# Patient Record
Sex: Female | Born: 1945 | Race: White | Hispanic: No | Marital: Married | Smoking: Never smoker
Health system: Southern US, Community
[De-identification: ages and names within clinical notes are randomized; demographics above are authoritative.]

## PROBLEM LIST (undated history)

## (undated) DIAGNOSIS — M1990 Unspecified osteoarthritis, unspecified site: Secondary | ICD-10-CM

## (undated) DIAGNOSIS — K5730 Diverticulosis of large intestine without perforation or abscess without bleeding: Secondary | ICD-10-CM

## (undated) DIAGNOSIS — I10 Essential (primary) hypertension: Secondary | ICD-10-CM

## (undated) DIAGNOSIS — C44519 Basal cell carcinoma of skin of other part of trunk: Secondary | ICD-10-CM

## (undated) DIAGNOSIS — M8580 Other specified disorders of bone density and structure, unspecified site: Secondary | ICD-10-CM

## (undated) DIAGNOSIS — K219 Gastro-esophageal reflux disease without esophagitis: Secondary | ICD-10-CM

## (undated) HISTORY — DX: Gastro-esophageal reflux disease without esophagitis: K21.9

## (undated) HISTORY — PX: KNEE ARTHROSCOPY: SHX127

## (undated) HISTORY — DX: Unspecified osteoarthritis, unspecified site: M19.90

## (undated) HISTORY — DX: Other specified disorders of bone density and structure, unspecified site: M85.80

## (undated) HISTORY — PX: TRIGGER FINGER RELEASE: SHX641

## (undated) HISTORY — DX: Basal cell carcinoma of skin of other part of trunk: C44.519

## (undated) HISTORY — DX: Essential (primary) hypertension: I10

## (undated) HISTORY — PX: TONSILLECTOMY: SUR1361

## (undated) HISTORY — DX: Diverticulosis of large intestine without perforation or abscess without bleeding: K57.30

## (undated) HISTORY — PX: BACK SURGERY: SHX140

---

## 1998-10-28 ENCOUNTER — Ambulatory Visit (HOSPITAL_COMMUNITY): Admission: RE | Admit: 1998-10-28 | Discharge: 1998-10-28 | Payer: Self-pay

## 1999-05-25 ENCOUNTER — Other Ambulatory Visit: Admission: RE | Admit: 1999-05-25 | Discharge: 1999-05-25 | Payer: Self-pay | Admitting: Obstetrics and Gynecology

## 2000-07-12 ENCOUNTER — Other Ambulatory Visit: Admission: RE | Admit: 2000-07-12 | Discharge: 2000-07-12 | Payer: Self-pay | Admitting: Obstetrics and Gynecology

## 2001-07-12 ENCOUNTER — Other Ambulatory Visit: Admission: RE | Admit: 2001-07-12 | Discharge: 2001-07-12 | Payer: Self-pay | Admitting: Obstetrics and Gynecology

## 2002-07-14 ENCOUNTER — Other Ambulatory Visit: Admission: RE | Admit: 2002-07-14 | Discharge: 2002-07-14 | Payer: Self-pay | Admitting: Obstetrics and Gynecology

## 2003-07-15 ENCOUNTER — Other Ambulatory Visit: Admission: RE | Admit: 2003-07-15 | Discharge: 2003-07-15 | Payer: Self-pay | Admitting: Obstetrics and Gynecology

## 2004-07-26 ENCOUNTER — Other Ambulatory Visit: Admission: RE | Admit: 2004-07-26 | Discharge: 2004-07-26 | Payer: Self-pay | Admitting: Addiction Medicine

## 2005-07-27 ENCOUNTER — Other Ambulatory Visit: Admission: RE | Admit: 2005-07-27 | Discharge: 2005-07-27 | Payer: Self-pay | Admitting: Obstetrics and Gynecology

## 2005-07-27 ENCOUNTER — Encounter: Payer: Self-pay | Admitting: Internal Medicine

## 2006-08-06 ENCOUNTER — Encounter: Payer: Self-pay | Admitting: Internal Medicine

## 2006-08-06 ENCOUNTER — Other Ambulatory Visit: Admission: RE | Admit: 2006-08-06 | Discharge: 2006-08-06 | Payer: Self-pay | Admitting: Obstetrics and Gynecology

## 2007-08-07 ENCOUNTER — Other Ambulatory Visit: Admission: RE | Admit: 2007-08-07 | Discharge: 2007-08-07 | Payer: Self-pay | Admitting: Obstetrics and Gynecology

## 2007-08-07 ENCOUNTER — Encounter: Payer: Self-pay | Admitting: Internal Medicine

## 2008-08-13 ENCOUNTER — Encounter: Payer: Self-pay | Admitting: Obstetrics and Gynecology

## 2008-08-13 ENCOUNTER — Ambulatory Visit: Payer: Self-pay | Admitting: Obstetrics and Gynecology

## 2008-08-13 ENCOUNTER — Other Ambulatory Visit: Admission: RE | Admit: 2008-08-13 | Discharge: 2008-08-13 | Payer: Self-pay | Admitting: Obstetrics and Gynecology

## 2008-09-17 ENCOUNTER — Ambulatory Visit: Payer: Self-pay | Admitting: Internal Medicine

## 2008-09-17 DIAGNOSIS — N951 Menopausal and female climacteric states: Secondary | ICD-10-CM | POA: Insufficient documentation

## 2008-09-17 DIAGNOSIS — R51 Headache: Secondary | ICD-10-CM | POA: Insufficient documentation

## 2008-09-17 DIAGNOSIS — R519 Headache, unspecified: Secondary | ICD-10-CM | POA: Insufficient documentation

## 2008-09-17 DIAGNOSIS — M1990 Unspecified osteoarthritis, unspecified site: Secondary | ICD-10-CM

## 2008-09-17 HISTORY — DX: Unspecified osteoarthritis, unspecified site: M19.90

## 2008-09-17 LAB — CONVERTED CEMR LAB
ALT: 29 units/L (ref 0–35)
AST: 30 units/L (ref 0–37)
Albumin: 4.3 g/dL (ref 3.5–5.2)
Alkaline Phosphatase: 79 units/L (ref 39–117)
BUN: 21 mg/dL (ref 6–23)
Basophils Absolute: 0.1 10*3/uL (ref 0.0–0.1)
Basophils Relative: 0.7 % (ref 0.0–3.0)
Bilirubin, Direct: 0.1 mg/dL (ref 0.0–0.3)
CO2: 34 meq/L — ABNORMAL HIGH (ref 19–32)
Calcium: 10.3 mg/dL (ref 8.4–10.5)
Chloride: 105 meq/L (ref 96–112)
Cholesterol: 219 mg/dL — ABNORMAL HIGH (ref 0–200)
Creatinine, Ser: 0.8 mg/dL (ref 0.4–1.2)
Eosinophils Absolute: 0.1 10*3/uL (ref 0.0–0.7)
Eosinophils Relative: 1 % (ref 0.0–5.0)
GFR calc non Af Amer: 77.4 mL/min (ref >60)
Glucose, Bld: 87 mg/dL (ref 70–99)
HCT: 41.8 % (ref 36.0–46.0)
Hemoglobin: 14.3 g/dL (ref 12.0–15.0)
Lymphocytes Relative: 30.6 % (ref 12.0–46.0)
Lymphs Abs: 2.6 10*3/uL (ref 0.7–4.0)
MCHC: 34.3 g/dL (ref 30.0–36.0)
MCV: 91.5 fL (ref 78.0–100.0)
Monocytes Absolute: 0.7 10*3/uL (ref 0.1–1.0)
Monocytes Relative: 8.4 % (ref 3.0–12.0)
Neutro Abs: 5 10*3/uL (ref 1.4–7.7)
Neutrophils Relative %: 59.3 % (ref 43.0–77.0)
Platelets: 268 10*3/uL (ref 150.0–400.0)
Potassium: 4.6 meq/L (ref 3.5–5.1)
RBC: 4.56 M/uL (ref 3.87–5.11)
RDW: 12 % (ref 11.5–14.6)
Sodium: 146 meq/L — ABNORMAL HIGH (ref 135–145)
TSH: 1.54 microintl units/mL (ref 0.35–5.50)
Total Bilirubin: 1.1 mg/dL (ref 0.3–1.2)
Total Protein: 7.5 g/dL (ref 6.0–8.3)
WBC: 8.5 10*3/uL (ref 4.5–10.5)

## 2008-10-29 ENCOUNTER — Telehealth: Payer: Self-pay | Admitting: Internal Medicine

## 2009-08-18 ENCOUNTER — Other Ambulatory Visit: Admission: RE | Admit: 2009-08-18 | Discharge: 2009-08-18 | Payer: Self-pay | Admitting: Obstetrics and Gynecology

## 2009-08-18 ENCOUNTER — Encounter: Payer: Self-pay | Admitting: Internal Medicine

## 2009-08-18 ENCOUNTER — Ambulatory Visit: Payer: Self-pay | Admitting: Obstetrics and Gynecology

## 2009-09-20 ENCOUNTER — Ambulatory Visit: Payer: Self-pay | Admitting: Internal Medicine

## 2009-09-20 LAB — CONVERTED CEMR LAB
ALT: 29 units/L (ref 0–35)
AST: 28 units/L (ref 0–37)
Albumin: 4.2 g/dL (ref 3.5–5.2)
Alkaline Phosphatase: 75 units/L (ref 39–117)
BUN: 21 mg/dL (ref 6–23)
Basophils Absolute: 0.1 10*3/uL (ref 0.0–0.1)
Basophils Relative: 0.7 % (ref 0.0–3.0)
Bilirubin Urine: NEGATIVE
Bilirubin, Direct: 0.1 mg/dL (ref 0.0–0.3)
Blood in Urine, dipstick: NEGATIVE
CO2: 29 meq/L (ref 19–32)
Calcium: 9.8 mg/dL (ref 8.4–10.5)
Chloride: 107 meq/L (ref 96–112)
Cholesterol: 210 mg/dL — ABNORMAL HIGH (ref 0–200)
Creatinine, Ser: 0.9 mg/dL (ref 0.4–1.2)
Direct LDL: 119.7 mg/dL
Eosinophils Absolute: 0.2 10*3/uL (ref 0.0–0.7)
Eosinophils Relative: 2.2 % (ref 0.0–5.0)
GFR calc non Af Amer: 70.03 mL/min (ref >60)
Glucose, Bld: 97 mg/dL (ref 70–99)
Glucose, Urine, Semiquant: NEGATIVE
HCT: 40 % (ref 36.0–46.0)
HDL: 83.6 mg/dL (ref >39.00)
Hemoglobin: 13.7 g/dL (ref 12.0–15.0)
Ketones, urine, test strip: NEGATIVE
Lymphocytes Relative: 29.6 % (ref 12.0–46.0)
Lymphs Abs: 2.8 10*3/uL (ref 0.7–4.0)
MCHC: 34.2 g/dL (ref 30.0–36.0)
MCV: 92.1 fL (ref 78.0–100.0)
Monocytes Absolute: 0.8 10*3/uL (ref 0.1–1.0)
Monocytes Relative: 8.5 % (ref 3.0–12.0)
Neutro Abs: 5.6 10*3/uL (ref 1.4–7.7)
Neutrophils Relative %: 59 % (ref 43.0–77.0)
Nitrite: NEGATIVE
Platelets: 285 10*3/uL (ref 150.0–400.0)
Potassium: 6 meq/L — ABNORMAL HIGH (ref 3.5–5.1)
Protein, U semiquant: NEGATIVE
RBC: 4.34 M/uL (ref 3.87–5.11)
RDW: 13 % (ref 11.5–14.6)
Sodium: 144 meq/L (ref 135–145)
Specific Gravity, Urine: 1.01
TSH: 4.03 microintl units/mL (ref 0.35–5.50)
Total Bilirubin: 0.6 mg/dL (ref 0.3–1.2)
Total CHOL/HDL Ratio: 3
Total Protein: 6.5 g/dL (ref 6.0–8.3)
Triglycerides: 55 mg/dL (ref 0.0–149.0)
Urobilinogen, UA: 0.2
VLDL: 11 mg/dL (ref 0.0–40.0)
WBC Urine, dipstick: NEGATIVE
WBC: 9.5 10*3/uL (ref 4.5–10.5)
pH: 7

## 2009-09-27 ENCOUNTER — Ambulatory Visit: Payer: Self-pay | Admitting: Internal Medicine

## 2009-10-05 ENCOUNTER — Ambulatory Visit: Payer: Self-pay | Admitting: Obstetrics and Gynecology

## 2010-03-10 NOTE — Assessment & Plan Note (Signed)
Summary: CPX- NO PELVIC//CCM   Vital Signs:  Patient profile:   65 year old female Menstrual status:  postmenopausal Height:      57.5 inches Weight:      153 pounds BMI:     32.65 Temp:     98.1 degrees F oral BP sitting:   142 / 92  (left arm) Cuff size:   regular  Vitals Entered By: Kathrynn Speed CMA (September 27, 2009 1:18 PM) CC: cpx, no pelvic, src Is Patient Diabetic? No     Menstrual Status postmenopausal   CC:  cpx, no pelvic, and src.  History of Present Illness: 65 year old patient who is seen today for a health maintenance examination.  She does quite well.  Will rule complaint of significant arthritis involving both knees.  She is on chronic anti-inflammatory medications and is followed closely by orthopedics.  She receives annual gynecologic care and has had a recent mammogram  Current Medications (verified): 1)  Mobic 15 Mg Tabs (Meloxicam) .... Once Daily 2)  Multi Viti 3)  Multivitamins  Tabs (Multiple Vitamin) .... Once Day 4)  Vitamin C 100 Mg Tabs (Ascorbic Acid) .... Once Day 5)  Formula E 400 400 Unit Caps (Vitamin E) .... Once Day 6)  Vitamin D3 1000 Unit Tabs (Cholecalciferol) .... Once Day 7)  B Complex  Tabs (B Complex Vitamins) .... Once Day 8)  Cinnamon 1000 Mg Caps (Cinnamon) .... Once Day 9)  Calcium 1200-1000 Mg-Unit Chew (Calcium Carbonate-Vit D-Min) .... Once Day 10)  Red Yeast Rice 1800 Mg Caps (Red Yeast Rice Extract) .... Once Day 11)  Coq-10 120 Mg Cr-Caps (Coenzyme Q10) .... Once Day 12)  Triple Flex Bone & Joint 750-600-100 Mg Tabs (Glucosamine-Chondroit-Calcium) .... Once A Day  Allergies (verified): No Known Drug Allergies  Past History:  Past Medical History: Reviewed history from 09/17/2008 and no changes required. Headache Osteoarthritis  Past Surgical History: arthroscopic left knee surgery in October, 1996 back surgery in June, 1999 ([facility]) right knee arthroscopic surgery in 2006 ([facility]) left thumb trigger finger  surgery October 2007  Colonoscopy.  2009  Shingles-[facility]  Family History: Reviewed history from 09/17/2008 and no changes required. father died young suicide death mother died in her 67s pancreatic cancer  one brother is well  Social History: Reviewed history from 09/17/2008 and no changes required. Married Never Smoked Regular exercise-no two daughters, 4 grandchildren  Review of Systems  The patient denies anorexia, fever, weight loss, weight gain, vision loss, decreased hearing, hoarseness, chest pain, syncope, dyspnea on exertion, peripheral edema, prolonged cough, headaches, hemoptysis, abdominal pain, melena, hematochezia, severe indigestion/heartburn, hematuria, incontinence, genital sores, muscle weakness, suspicious skin lesions, transient blindness, difficulty walking, depression, unusual weight change, abnormal bleeding, enlarged lymph nodes, angioedema, and breast masses.    Physical Exam  General:  overweight-appearing.  140/82overweight-appearing.   Head:  Normocephalic and atraumatic without obvious abnormalities. No apparent alopecia or balding. Eyes:  No corneal or conjunctival inflammation noted. EOMI. Perrla. Funduscopic exam benign, without hemorrhages, exudates or papilledema. Vision grossly normal. Ears:  External ear exam shows no significant lesions or deformities.  Otoscopic examination reveals clear canals, tympanic membranes are intact bilaterally without bulging, retraction, inflammation or discharge. Hearing is grossly normal bilaterally. Nose:  External nasal examination shows no deformity or inflammation. Nasal mucosa are pink and moist without lesions or exudates. Mouth:  Oral mucosa and oropharynx without lesions or exudates.  Teeth in good repair. Neck:  No deformities, masses, or tenderness  noted. Chest Wall:  No deformities, masses, or tenderness noted. Lungs:  Normal respiratory effort, chest expands  symmetrically. Lungs are clear to auscultation, no crackles or wheezes. Heart:  Normal rate and regular rhythm. S1 and S2 normal without gallop, murmur, click, rub or other extra sounds. Abdomen:  Bowel sounds positive,abdomen soft and non-tender without masses, organomegaly or hernias noted. Msk:  No deformity or scoliosis noted of thoracic or lumbar spine.   Pulses:  R and L carotid,radial,femoral,dorsalis pedis and posterior tibial pulses are full and equal bilaterally Extremities:  No clubbing, cyanosis, edema, or deformity noted with normal full range of motion of all joints.   Neurologic:  No cranial nerve deficits noted. Station and gait are normal. Plantar reflexes are down-going bilaterally. DTRs are symmetrical throughout. Sensory, motor and coordinative functions appear intact. Skin:  Intact without suspicious lesions or rashes Cervical Nodes:  No lymphadenopathy noted Axillary Nodes:  No palpable lymphadenopathy Inguinal Nodes:  No significant adenopathy Psych:  Cognition and judgment appear intact. Alert and cooperative with normal attention span and concentration. No apparent delusions, illusions, hallucinations   Impression & Recommendations:  Problem # 1:  Preventive Health Care (ICD-V70.0)  Complete Medication List: 1)  Mobic 15 Mg Tabs (Meloxicam) .... Once daily 2)  Multi Viti  3)  Multivitamins Tabs (Multiple vitamin) .... Once day 4)  Vitamin C 100 Mg Tabs (Ascorbic acid) .... Once day 5)  Formula E 400 400 Unit Caps (Vitamin e) .... Once day 6)  Vitamin D3 1000 Unit Tabs (Cholecalciferol) .... Once day 7)  B Complex Tabs (B complex vitamins) .... Once day 8)  Cinnamon 1000 Mg Caps (cinnamon)  .... Once day 9)  Calcium 1200-1000 Mg-unit Chew (Calcium carbonate-vit d-min) .... Once day 10)  Red Yeast Rice 1800 Mg Caps (red Yeast Rice Extract)  .... Once day 11)  Coq-10 120 Mg Cr-caps (coenzyme Q10)  .... Once day 12)  Triple Flex Bone & Joint 750-600-100 Mg Tabs  (Glucosamine-chondroit-calcium) .... Once a day  Patient Instructions: 1)  It is important that you exercise regularly at least 20 minutes 5 times a week. If you develop chest pain, have severe difficulty breathing, or feel very tired , stop exercising immediately and seek medical attention. 2)  You need to lose weight. Consider a lower calorie diet and regular exercise.  3)  Take calcium +Vitamin D daily. 4)  Check your Blood Pressure regularly. If it is above: 150/90 you should make an appointment. Prescriptions: MOBIC 15 MG TABS (MELOXICAM) once daily  #90 x 4   Entered and Authorized by:   Gordy Savers  MD   Signed by:   Gordy Savers  MD on 09/27/2009   Method used:   Print then Give to Patient   RxID:   [id number]

## 2010-03-10 NOTE — Letter (Signed)
Summary: [facility]   Imported By: Maryln Gottron 08/24/2009 13:25:19  _____________________________________________________________________  External Attachment:    Type:   Image     Comment:   External Document

## 2010-03-10 NOTE — Progress Notes (Signed)
Summary: Personal History provided by patient  Personal History provided by patient   Imported By: Maryln Gottron 09/30/2009 11:01:05  _____________________________________________________________________  External Attachment:    Type:   Image     Comment:   External Document

## 2010-06-29 LAB — HM MAMMOGRAPHY: HM Mammogram: NEGATIVE

## 2010-06-30 ENCOUNTER — Encounter: Payer: Self-pay | Admitting: Internal Medicine

## 2010-07-11 ENCOUNTER — Encounter: Payer: Self-pay | Admitting: Internal Medicine

## 2010-08-22 ENCOUNTER — Other Ambulatory Visit: Payer: Self-pay | Admitting: Obstetrics and Gynecology

## 2010-08-22 ENCOUNTER — Encounter (INDEPENDENT_AMBULATORY_CARE_PROVIDER_SITE_OTHER): Payer: 59 | Admitting: Obstetrics and Gynecology

## 2010-08-22 ENCOUNTER — Other Ambulatory Visit (HOSPITAL_COMMUNITY)
Admission: RE | Admit: 2010-08-22 | Discharge: 2010-08-22 | Disposition: A | Discharge status: Home / Self Care | Payer: 59 | Source: Ambulatory Visit | Attending: Obstetrics and Gynecology | Admitting: Obstetrics and Gynecology

## 2010-08-22 DIAGNOSIS — Z01419 Encounter for gynecological examination (general) (routine) without abnormal findings: Secondary | ICD-10-CM

## 2010-08-22 DIAGNOSIS — Z124 Encounter for screening for malignant neoplasm of cervix: Secondary | ICD-10-CM | POA: Insufficient documentation

## 2010-09-12 LAB — HM PAP SMEAR: HM Pap smear: NORMAL

## 2010-09-22 ENCOUNTER — Other Ambulatory Visit (INDEPENDENT_AMBULATORY_CARE_PROVIDER_SITE_OTHER): Payer: 59

## 2010-09-22 DIAGNOSIS — Z0000 Encounter for general adult medical examination without abnormal findings: Secondary | ICD-10-CM

## 2010-09-22 LAB — CBC WITH DIFFERENTIAL/PLATELET
Basophils Absolute: 0.1 10*3/uL (ref 0.0–0.1)
Eosinophils Absolute: 0.2 10*3/uL (ref 0.0–0.7)
HCT: 41.1 % (ref 36.0–46.0)
Lymphs Abs: 2.5 10*3/uL (ref 0.7–4.0)
MCHC: 33.5 g/dL (ref 30.0–36.0)
MCV: 92.9 fl (ref 78.0–100.0)
Monocytes Absolute: 0.6 10*3/uL (ref 0.1–1.0)
Neutrophils Relative %: 55.2 % (ref 43.0–77.0)
Platelets: 259 10*3/uL (ref 150.0–400.0)
RDW: 12.9 % (ref 11.5–14.6)
WBC: 7.4 10*3/uL (ref 4.5–10.5)

## 2010-09-22 LAB — HEPATIC FUNCTION PANEL
ALT: 35 U/L (ref 0–35)
Bilirubin, Direct: 0 mg/dL (ref 0.0–0.3)
Total Bilirubin: 0.7 mg/dL (ref 0.3–1.2)

## 2010-09-22 LAB — LIPID PANEL
Cholesterol: 246 mg/dL — ABNORMAL HIGH (ref 0–200)
HDL: 82.2 mg/dL (ref >39.00)
Total CHOL/HDL Ratio: 3
Triglycerides: 79 mg/dL (ref 0.0–149.0)
VLDL: 15.8 mg/dL (ref 0.0–40.0)

## 2010-09-22 LAB — POCT URINALYSIS DIPSTICK
Bilirubin, UA: NEGATIVE
Nitrite, UA: NEGATIVE
Protein, UA: NEGATIVE
pH, UA: 7

## 2010-09-22 LAB — BASIC METABOLIC PANEL
BUN: 22 mg/dL (ref 6–23)
Chloride: 102 mEq/L (ref 96–112)
Creatinine, Ser: 0.8 mg/dL (ref 0.4–1.2)
Glucose, Bld: 91 mg/dL (ref 70–99)
Potassium: 5.2 mEq/L — ABNORMAL HIGH (ref 3.5–5.1)

## 2010-09-28 ENCOUNTER — Encounter: Payer: Self-pay | Admitting: Internal Medicine

## 2010-09-30 ENCOUNTER — Ambulatory Visit (INDEPENDENT_AMBULATORY_CARE_PROVIDER_SITE_OTHER): Payer: 59 | Admitting: Internal Medicine

## 2010-09-30 ENCOUNTER — Encounter: Payer: Self-pay | Admitting: Internal Medicine

## 2010-09-30 DIAGNOSIS — Z0000 Encounter for general adult medical examination without abnormal findings: Secondary | ICD-10-CM

## 2010-09-30 DIAGNOSIS — Z23 Encounter for immunization: Secondary | ICD-10-CM

## 2010-09-30 MED ORDER — MELOXICAM 15 MG PO TABS: 15.0000 mg | ORAL_TABLET | Freq: Every day | ORAL | Status: DC

## 2010-09-30 NOTE — Progress Notes (Signed)
  Subjective:    Patient ID: Robin Francis, female    DOB: November 16, 1945, 65 y.o.   MRN: [id number]  HPI  65 year old patient who is seen today for a health maintenance examination. She enjoys excellent health. She is followed by orthopedics due to osteoarthritis. She is followed by gynecology and has had a colonoscopy in 2008. No new concerns or complaints today laboratory studies were reviewed and these were unremarkable Past medical history reviewed from the chart unremarkable except for orthopedic procedures Medications include meloxicam and a number of herbal products and vitamins Family history reviewed father died of suicide death mother died young from pancreatic cancer one brother remains well    Review of Systems  Constitutional: Negative for fever, appetite change, fatigue and unexpected weight change.  HENT: Negative for hearing loss, ear pain, nosebleeds, congestion, sore throat, mouth sores, trouble swallowing, neck stiffness, dental problem, voice change, sinus pressure and tinnitus.   Eyes: Negative for photophobia, pain, redness and visual disturbance.  Respiratory: Negative for cough, chest tightness and shortness of breath.   Cardiovascular: Negative for chest pain, palpitations and leg swelling.  Gastrointestinal: Negative for nausea, vomiting, abdominal pain, diarrhea, constipation, blood in stool, abdominal distention and rectal pain.  Genitourinary: Negative for dysuria, urgency, frequency, hematuria, flank pain, vaginal bleeding, vaginal discharge, difficulty urinating, genital sores, vaginal pain, menstrual problem and pelvic pain.  Musculoskeletal: Negative for back pain and arthralgias.  Skin: Negative for rash.  Neurological: Negative for dizziness, syncope, speech difficulty, weakness, light-headedness, numbness and headaches.  Hematological: Negative for adenopathy. Does not bruise/bleed easily.  Psychiatric/Behavioral: Negative for suicidal ideas, behavioral  problems, self-injury, dysphoric mood and agitation. The patient is not nervous/anxious.        Objective:   Physical Exam  Constitutional: She is oriented to person, place, and time. She appears well-developed and well-nourished.  HENT:  Head: Normocephalic and atraumatic.  Right Ear: External ear normal.  Left Ear: External ear normal.  Mouth/Throat: Oropharynx is clear and moist.  Eyes: Conjunctivae and EOM are normal.  Neck: Normal range of motion. Neck supple. No JVD present. No thyromegaly present.  Cardiovascular: Normal rate, regular rhythm, normal heart sounds and intact distal pulses.   No murmur heard. Pulmonary/Chest: Effort normal and breath sounds normal. She has no wheezes. She has no rales.  Abdominal: Soft. Bowel sounds are normal. She exhibits no distension and no mass. There is no tenderness. There is no rebound and no guarding.  Musculoskeletal: Normal range of motion. She exhibits no edema and no tenderness.  Neurological: She is alert and oriented to person, place, and time. She has normal reflexes. No cranial nerve deficit. She exhibits normal muscle tone. Coordination normal.  Skin: Skin is warm and dry. No rash noted.  Psychiatric: She has a normal mood and affect. Her behavior is normal.          Assessment & Plan:   Preventive health examination  We'll continue calcium and vitamin D supplements. We'll continue regular exercise. Efforts at  weight loss encouraged return in one year for followup.

## 2010-09-30 NOTE — Patient Instructions (Signed)
Take a calcium supplement, plus [phone] units of vitamin D    It is important that you exercise regularly, at least 20 minutes 3 to 4 times per week.  If you develop chest pain or shortness of breath seek  medical attention.  You need to lose weight.  Consider a lower calorie diet and regular exercise.  Return in one year for follow-up

## 2010-10-27 ENCOUNTER — Other Ambulatory Visit: Payer: Self-pay | Admitting: Internal Medicine

## 2011-01-05 NOTE — Progress Notes (Signed)
  Subjective:    Patient ID: Robin Francis, female    DOB: 01-Jun-1945, 66 y.o.   MRN: [id number]  HPI    Review of Systems     Objective:   Physical Exam        Assessment & Plan:

## 2011-01-06 ENCOUNTER — Other Ambulatory Visit: Payer: Self-pay | Admitting: Orthopedic Surgery

## 2011-03-20 ENCOUNTER — Encounter (HOSPITAL_COMMUNITY): Payer: Self-pay | Admitting: Pharmacy Technician

## 2011-03-21 ENCOUNTER — Encounter (HOSPITAL_COMMUNITY): Payer: Self-pay

## 2011-03-21 ENCOUNTER — Ambulatory Visit (HOSPITAL_COMMUNITY)
Admission: RE | Admit: 2011-03-21 | Discharge: 2011-03-21 | Disposition: A | Discharge status: Home / Self Care | Payer: 59 | Source: Ambulatory Visit | Attending: Orthopedic Surgery | Admitting: Orthopedic Surgery

## 2011-03-21 ENCOUNTER — Encounter (HOSPITAL_COMMUNITY)
Admission: RE | Admit: 2011-03-21 | Discharge: 2011-03-21 | Disposition: A | Discharge status: Home / Self Care | Payer: 59 | Source: Ambulatory Visit | Attending: Orthopedic Surgery | Admitting: Orthopedic Surgery

## 2011-03-21 DIAGNOSIS — IMO0002 Reserved for concepts with insufficient information to code with codable children: Secondary | ICD-10-CM | POA: Insufficient documentation

## 2011-03-21 DIAGNOSIS — Z01812 Encounter for preprocedural laboratory examination: Secondary | ICD-10-CM | POA: Insufficient documentation

## 2011-03-21 DIAGNOSIS — M1710 Unilateral primary osteoarthritis, unspecified knee: Secondary | ICD-10-CM | POA: Insufficient documentation

## 2011-03-21 LAB — URINALYSIS, ROUTINE W REFLEX MICROSCOPIC
Glucose, UA: NEGATIVE mg/dL
Hgb urine dipstick: NEGATIVE
Leukocytes, UA: NEGATIVE
Specific Gravity, Urine: 1.016 (ref 1.005–1.030)
Urobilinogen, UA: 0.2 mg/dL (ref 0.0–1.0)

## 2011-03-21 LAB — COMPREHENSIVE METABOLIC PANEL
ALT: 29 U/L (ref 0–35)
Albumin: 3.9 g/dL (ref 3.5–5.2)
Alkaline Phosphatase: 74 U/L (ref 39–117)
Potassium: 4.9 mEq/L (ref 3.5–5.1)
Sodium: 142 mEq/L (ref 135–145)
Total Protein: 7.4 g/dL (ref 6.0–8.3)

## 2011-03-21 LAB — SURGICAL PCR SCREEN
MRSA, PCR: NEGATIVE
Staphylococcus aureus: NEGATIVE

## 2011-03-21 LAB — PROTIME-INR: Prothrombin Time: 13 seconds (ref 11.6–15.2)

## 2011-03-21 LAB — CBC
MCHC: 34.2 g/dL (ref 30.0–36.0)
RDW: 12.8 % (ref 11.5–15.5)

## 2011-03-21 LAB — APTT: aPTT: 34 seconds (ref 24–37)

## 2011-03-21 NOTE — Patient Instructions (Signed)
20 Robin Francis  03/21/2011   Your procedure is scheduled on:  Monday 04/03/2011 at 0955 am  Report to [facility] at 0755 AM.  Call this number if you have problems the morning of surgery: [phone]   Remember:   Do not eat food:After Midnight.  May have clear liquids:until Midnight .  Clear liquids include soda, tea, black coffee, apple or grape juice, broth.  Take these medicines the morning of surgery with A SIP OF WATER: Zyrtec   Do not wear jewelry, make-up or nail polish.  Do not wear lotions, powders, or perfumes.   Do not shave 48 hours prior to surgery.(women only-shaving legs)  Do not bring valuables to the hospital.  Contacts, dentures or bridgework may not be worn into surgery.  Leave suitcase in the car. After surgery it may be brought to your room.  For patients admitted to the hospital, checkout time is 11:00 AM the day of discharge.   Patients discharged the day of surgery will not be allowed to drive home.  Name and phone number of your driver:   Special Instructions: CHG Shower Use Special Wash: 1/2 bottle night before surgery and 1/2 bottle morning of surgery.   Please read over the following fact sheets that you were given: MRSA Information

## 2011-03-22 NOTE — Pre-Procedure Instructions (Signed)
Faith Tucker,Medical Assistant notified to let Dr. Lequita Halt know of abnormal GFR on patient.

## 2011-04-02 ENCOUNTER — Other Ambulatory Visit: Payer: Self-pay | Admitting: Orthopedic Surgery

## 2011-04-02 NOTE — H&P (Signed)
Robin Francis  DOB: 02/11/1945 Married / Language: English / Race: White / Female  Date of Admission:  04/03/2011  Chief Complaint:  Left Knee Pain  History of Present Illness The patient is a 65 year old female who comes in today for a preoperative History and Physical. The patient is scheduled for a left total knee arthroplasty to be performed by Dr. Frank V. Aluisio, MD at [facility] on 04/03/2011. The patient is a 65 year old female who presents for her left knee pain. The patient is seen in referral from Dr. Supple. The patient reports left knee (worse than right) symptoms including: pain, instability, giving way and weakness without any known injury.The patient feels that the symptoms are worsening. The patient has the current diagnosis of knee osteoarthritis. Prior to being seen today the patient was previously evaluated in this clinic. Previous work-up for this problem has included knee x-rays. Past treatment for this problem has included intra-articular injection of corticosteroids and visco injections. These have not provided very much relief. Current treatment includes nonsteroidal anti-inflammatory drugs in the form of Mobic. They have been treated conservatively in the past for the above stated problem and despite conservative measures, they continue to have progressive pain and severe functional limitations and dysfunction. They have failed non-operative management including home exercise, medications, and injections. It is felt that they would benefit from undergoing total joint replacement. Risks and benefits of the procedure have been discussed with the patient and they elect to proceed with surgery. There are no active contraindications to surgery such as ongoing infection or rapidly progressive neurological disease.  Allergies No Known Drug Allergies   Family History  Cancer. mother No pertinent family history Other medical problems. [place] parents died young so I  don't know of any complications Father. Deceased. suicide Mother. Pancreatic cancer  Social History Alcohol use. current drinker; drinks beer, wine and hard liquor; only occasionally per week Children. 2 Current work status. unemployed Drug/Alcohol Rehab (Currently). no Exercise. Exercises daily; does running / walking and gym / weights Illicit drug use. no Living situation. live with spouse Marital status. married Number of flights of stairs before winded. 2-3 Pain Contract. no Previously in rehab. no Tobacco use. Never smoker. never smoker Post-Surgical Plans. Plan to go home Advance Directives. Living Will, Healthcare POA  Medication History Multi-Vitamin ( Intravenous) Active. Vitamin C & E Combination (500-400MG-UNIT Capsule, Oral) Active. Vitamin D3 (1000UNIT Tablet, Oral) Active. B Complex ( Oral) Active. Cinnamon (500MG Tablet, Oral) Active. Omega 3-6-9 Complex ( Oral) Active. Red Yeast Rice (600MG Capsule, Oral) Active. Co Q-10 (120MG Capsule, Oral) Active. Triple Flex ( Oral) Active. ZyrTEC ( Oral) Specific dose unknown - Active.  Past Surgical History Arthroscopy of Knee. bilateral, left - 10/96, right 06/2004 Spinal Fusion. Date: 07/21/1997. lower back Left Thumb Surgery  Past Medical Osteoarthritis Shingles Hemorrhoids Osteopenia Measles Mumps Rubella  Review of Systems General:Not Present- Chills, Fever, Night Sweats, Appetite Loss, Fatigue, Feeling sick, Weight Gain and Weight Loss. Skin:Not Present- Itching, Rash, Skin Color Changes, Ulcer, Psoriasis and Change in Hair or Nails. HEENT:Not Present- Sensitivity to light, Nose Bleed, Visual Loss, Decreased Hearing and Ringing in the Ears. Neck:Not Present- Swollen Glands and Neck Mass. Respiratory:Not Present- Shortness of breath, Snoring, Chronic Cough and Bloody sputum. Cardiovascular:Not Present- Shortness of Breath, Chest Pain, Swelling of Extremities, Leg Cramps and  Palpitations. Gastrointestinal:Present- Heartburn. Not Present- Bloody Stool, Abdominal Pain, Vomiting, Nausea and Incontinence of Stool. Female Genitourinary:Not Present- Blood in Urine, Irregular/missing periods,   Frequency, Incontinence and Nocturia. Musculoskeletal:Present- Joint Swelling and Joint Pain. Not Present- Muscle Weakness, Muscle Pain, Joint Stiffness and Back Pain. Neurological:Not Present- Tingling, Numbness, Burning, Tremor, Headaches and Dizziness. Psychiatric:Not Present- Anxiety, Depression and Memory Loss. Endocrine:Not Present- Cold Intolerance, Heat Intolerance, Excessive hunger and Excessive Thirst. Hematology:Not Present- Abnormal Bleeding, Abnormal bruising, Anemia and Blood Clots.  Vitals Weight: 150 lb Height: 59 in Body Surface Area: 1.68 m Body Mass Index: 30.3 kg/m Pulse: 76 (Regular) Resp.: 14 (Unlabored) BP: 132/82 (Sitting, Right Arm, Standard)  Physical Exam Patient is a 65 year old female with continued knee pain.  General Mental Status - Alert, cooperative and good historian. General Appearance- pleasant. Not in acute distress. Orientation- Oriented X3. Build & Nutrition- Petite, Well nourished and Well developed. Mental Status- good historian.  Head and Neck Head- normocephalic, atraumatic . Neck Global Assessment- supple. no bruit auscultated on the right, no bruit auscultated on the left and non-tender.  Eye Pupil- Bilateral- Regular and Round. Motion- Bilateral- EOMI.  Chest and Lung Exam Auscultation: Breath sounds:- clear at anterior chest wall and - clear at posterior chest wall. Adventitious sounds:- No Adventitious sounds.  Cardiovascular Auscultation:Rhythm- Regular rate and rhythm. Heart Sounds- S1 WNL and S2 WNL. Murmurs & Other Heart Sounds:Auscultation of the heart reveals - No Murmurs.  Abdomen Palpation/Percussion:Tenderness- Abdomen is non-tender to palpation. Rigidity  (guarding)- Abdomen is soft. Auscultation:Auscultation of the abdomen reveals - Bowel sounds normal.  Female Genitourinary Not done, not pertinent to present illness  Musculoskeletal  On physical exam a well developed female, alert and oriented in no apparent distress. Both hips, normal range of motion, no discomfort. Right knee no effusion, no deformity, range 0 to 135. Slight crepitus on range of motion. No instability noted. The left knee, slight varus, range about 5 to 120, moderate crepitus on range of motion, tender medial greater than lateral with no instability noted.  RADIOGRAPHS: AP both knees and lateral show bone on bone arthritis in the medial compartment with patellofemoral involvement also. She has some slight subluxation of her tibia on femur.  Assessment & Plan Osteoarthritis Left Knee  Patient is for a Left Total Knee Replacement by Dr. Aluisio.  Plan is to go home after the surgery.  PCP - Dr. Peter Kwiatkowski - Patient has been seen preoperatively by Dr. Kwiatkowski and felt ot be stable for surgery.  Drew Kamayah Pillay, PA-C  

## 2011-04-03 ENCOUNTER — Encounter (HOSPITAL_COMMUNITY)
Admission: RE | Disposition: A | Discharge status: Home Health | Payer: Self-pay | Source: Ambulatory Visit | Attending: Orthopedic Surgery

## 2011-04-03 ENCOUNTER — Encounter (HOSPITAL_COMMUNITY): Payer: Self-pay | Admitting: Anesthesiology

## 2011-04-03 ENCOUNTER — Inpatient Hospital Stay (HOSPITAL_COMMUNITY)
Admission: RE | Admit: 2011-04-03 | Discharge: 2011-04-06 | DRG: 470 | Disposition: A | Discharge status: Home Health | Payer: 59 | Source: Ambulatory Visit | Attending: Orthopedic Surgery | Admitting: Orthopedic Surgery

## 2011-04-03 ENCOUNTER — Inpatient Hospital Stay (HOSPITAL_COMMUNITY): Payer: 59 | Admitting: Anesthesiology

## 2011-04-03 ENCOUNTER — Encounter (HOSPITAL_COMMUNITY): Payer: Self-pay | Admitting: *Deleted

## 2011-04-03 DIAGNOSIS — D62 Acute posthemorrhagic anemia: Secondary | ICD-10-CM | POA: Diagnosis not present

## 2011-04-03 DIAGNOSIS — Z981 Arthrodesis status: Secondary | ICD-10-CM

## 2011-04-03 DIAGNOSIS — E871 Hypo-osmolality and hyponatremia: Secondary | ICD-10-CM | POA: Diagnosis not present

## 2011-04-03 DIAGNOSIS — M179 Osteoarthritis of knee, unspecified: Secondary | ICD-10-CM | POA: Diagnosis present

## 2011-04-03 DIAGNOSIS — Z96659 Presence of unspecified artificial knee joint: Secondary | ICD-10-CM

## 2011-04-03 DIAGNOSIS — M1710 Unilateral primary osteoarthritis, unspecified knee: Principal | ICD-10-CM | POA: Diagnosis present

## 2011-04-03 HISTORY — PX: TOTAL KNEE ARTHROPLASTY: SHX125

## 2011-04-03 LAB — TYPE AND SCREEN

## 2011-04-03 SURGERY — ARTHROPLASTY, KNEE, TOTAL
Anesthesia: General | Site: Knee | Laterality: Left | Wound class: Clean

## 2011-04-03 MED ORDER — SODIUM CHLORIDE 0.9 % IJ SOLN: 9.0000 mL | INTRAMUSCULAR | Status: DC | PRN

## 2011-04-03 MED ORDER — PHENOL 1.4 % MT LIQD: 1.0000 | OROMUCOSAL | Status: DC | PRN

## 2011-04-03 MED ORDER — LORATADINE 10 MG PO TABS
10.0000 mg | ORAL_TABLET | Freq: Every day | ORAL | Status: DC
  Administered 2011-04-04 – 2011-04-06 (×3): 10 mg via ORAL
  Filled 2011-04-03 (×4): qty 1

## 2011-04-03 MED ORDER — METHOCARBAMOL 500 MG PO TABS
500.0000 mg | ORAL_TABLET | Freq: Four times a day (QID) | ORAL | Status: DC | PRN
  Administered 2011-04-04 – 2011-04-06 (×5): 500 mg via ORAL
  Filled 2011-04-03 (×5): qty 1

## 2011-04-03 MED ORDER — BUPIVACAINE 0.25 % ON-Q PUMP SINGLE CATH 300ML
300.0000 mL | INJECTION | Status: DC
  Filled 2011-04-03: qty 300

## 2011-04-03 MED ORDER — EPHEDRINE SULFATE 50 MG/ML IJ SOLN
INTRAMUSCULAR | Status: DC | PRN
  Administered 2011-04-03: 5 mg via INTRAVENOUS

## 2011-04-03 MED ORDER — NALOXONE HCL 0.4 MG/ML IJ SOLN: 0.4000 mg | INTRAMUSCULAR | Status: DC | PRN

## 2011-04-03 MED ORDER — ACETAMINOPHEN 10 MG/ML IV SOLN
1000.0000 mg | Freq: Four times a day (QID) | INTRAVENOUS | Status: AC
  Administered 2011-04-03 – 2011-04-04 (×4): 1000 mg via INTRAVENOUS
  Filled 2011-04-03 (×4): qty 100

## 2011-04-03 MED ORDER — LACTATED RINGERS IV SOLN: INTRAVENOUS | Status: DC

## 2011-04-03 MED ORDER — CEFAZOLIN SODIUM 1-5 GM-% IV SOLN
1.0000 g | Freq: Four times a day (QID) | INTRAVENOUS | Status: AC
  Administered 2011-04-03 – 2011-04-04 (×3): 1 g via INTRAVENOUS
  Filled 2011-04-03 (×3): qty 50

## 2011-04-03 MED ORDER — METOCLOPRAMIDE HCL 10 MG PO TABS: 5.0000 mg | ORAL_TABLET | Freq: Three times a day (TID) | ORAL | Status: DC | PRN

## 2011-04-03 MED ORDER — ACETAMINOPHEN 650 MG RE SUPP: 650.0000 mg | Freq: Four times a day (QID) | RECTAL | Status: DC | PRN

## 2011-04-03 MED ORDER — SODIUM CHLORIDE 0.9 % IR SOLN
Status: DC | PRN
  Administered 2011-04-03: 1000 mL

## 2011-04-03 MED ORDER — ONDANSETRON HCL 4 MG/2ML IJ SOLN: 4.0000 mg | Freq: Four times a day (QID) | INTRAMUSCULAR | Status: DC | PRN

## 2011-04-03 MED ORDER — SODIUM CHLORIDE 0.45 % IV SOLN
INTRAVENOUS | Status: DC
  Administered 2011-04-03: 18:00:00 via INTRAVENOUS

## 2011-04-03 MED ORDER — DEXTROSE 5 % IV SOLN
500.0000 mg | Freq: Four times a day (QID) | INTRAVENOUS | Status: DC | PRN
  Administered 2011-04-03: 500 mg via INTRAVENOUS
  Filled 2011-04-03 (×2): qty 5

## 2011-04-03 MED ORDER — LACTATED RINGERS IV SOLN
INTRAVENOUS | Status: DC | PRN
  Administered 2011-04-03 (×2): via INTRAVENOUS

## 2011-04-03 MED ORDER — DEXAMETHASONE SODIUM PHOSPHATE 10 MG/ML IJ SOLN
INTRAMUSCULAR | Status: DC | PRN
  Administered 2011-04-03: 10 mg via INTRAVENOUS

## 2011-04-03 MED ORDER — MORPHINE SULFATE (PF) 1 MG/ML IV SOLN
INTRAVENOUS | Status: DC
  Administered 2011-04-03: 5 mg via INTRAVENOUS
  Administered 2011-04-03: 12:00:00 via INTRAVENOUS
  Administered 2011-04-04: 4 mg via INTRAVENOUS
  Filled 2011-04-03: qty 25

## 2011-04-03 MED ORDER — BISACODYL 10 MG RE SUPP: 10.0000 mg | Freq: Every day | RECTAL | Status: DC | PRN

## 2011-04-03 MED ORDER — METOCLOPRAMIDE HCL 5 MG/ML IJ SOLN
5.0000 mg | Freq: Three times a day (TID) | INTRAMUSCULAR | Status: DC | PRN
  Administered 2011-04-03: 10 mg via INTRAVENOUS
  Filled 2011-04-03: qty 2

## 2011-04-03 MED ORDER — POLYETHYL GLYCOL-PROPYL GLYCOL 0.4-0.3 % OP SOLN: 1.0000 [drp] | Freq: Every day | OPHTHALMIC | Status: DC

## 2011-04-03 MED ORDER — FENTANYL CITRATE 0.05 MG/ML IJ SOLN
25.0000 ug | INTRAMUSCULAR | Status: DC | PRN
  Administered 2011-04-03 (×2): 50 ug via INTRAVENOUS

## 2011-04-03 MED ORDER — BUPIVACAINE 0.25 % ON-Q PUMP SINGLE CATH 300ML
INJECTION | Status: DC | PRN
  Administered 2011-04-03: 300 mL

## 2011-04-03 MED ORDER — SODIUM CHLORIDE 0.9 % IV SOLN: INTRAVENOUS | Status: DC

## 2011-04-03 MED ORDER — CEFAZOLIN SODIUM 1-5 GM-% IV SOLN
1.0000 g | INTRAVENOUS | Status: AC
  Administered 2011-04-03: 1 g via INTRAVENOUS

## 2011-04-03 MED ORDER — POLYETHYLENE GLYCOL 3350 17 G PO PACK
17.0000 g | PACK | Freq: Every day | ORAL | Status: DC | PRN
  Filled 2011-04-03: qty 1

## 2011-04-03 MED ORDER — OXYCODONE HCL 5 MG PO TABS
5.0000 mg | ORAL_TABLET | ORAL | Status: DC | PRN
  Administered 2011-04-04 (×2): 5 mg via ORAL
  Administered 2011-04-04 – 2011-04-06 (×10): 10 mg via ORAL
  Filled 2011-04-03: qty 2
  Filled 2011-04-03: qty 1
  Filled 2011-04-03 (×4): qty 2
  Filled 2011-04-03: qty 1
  Filled 2011-04-03 (×4): qty 2
  Filled 2011-04-03: qty 1
  Filled 2011-04-03: qty 2

## 2011-04-03 MED ORDER — ACETAMINOPHEN 10 MG/ML IV SOLN: 1000.0000 mg | Freq: Once | INTRAVENOUS | Status: DC

## 2011-04-03 MED ORDER — DIPHENHYDRAMINE HCL 12.5 MG/5ML PO ELIX
12.5000 mg | ORAL_SOLUTION | Freq: Four times a day (QID) | ORAL | Status: DC | PRN
  Filled 2011-04-03: qty 5

## 2011-04-03 MED ORDER — ACETAMINOPHEN 10 MG/ML IV SOLN
INTRAVENOUS | Status: DC | PRN
  Administered 2011-04-03: 1000 mg via INTRAVENOUS

## 2011-04-03 MED ORDER — MENTHOL 3 MG MT LOZG: 1.0000 | LOZENGE | OROMUCOSAL | Status: DC | PRN

## 2011-04-03 MED ORDER — PROPOFOL 10 MG/ML IV BOLUS
INTRAVENOUS | Status: DC | PRN
  Administered 2011-04-03: 160 mg via INTRAVENOUS

## 2011-04-03 MED ORDER — DOCUSATE SODIUM 100 MG PO CAPS
100.0000 mg | ORAL_CAPSULE | Freq: Two times a day (BID) | ORAL | Status: DC
  Administered 2011-04-03 – 2011-04-06 (×6): 100 mg via ORAL
  Filled 2011-04-03 (×8): qty 1

## 2011-04-03 MED ORDER — TEMAZEPAM 15 MG PO CAPS: 15.0000 mg | ORAL_CAPSULE | Freq: Every evening | ORAL | Status: DC | PRN

## 2011-04-03 MED ORDER — FLEET ENEMA 7-19 GM/118ML RE ENEM: 1.0000 | ENEMA | Freq: Once | RECTAL | Status: AC | PRN

## 2011-04-03 MED ORDER — DIPHENHYDRAMINE HCL 50 MG/ML IJ SOLN: 12.5000 mg | Freq: Four times a day (QID) | INTRAMUSCULAR | Status: DC | PRN

## 2011-04-03 MED ORDER — ACETAMINOPHEN 325 MG PO TABS
650.0000 mg | ORAL_TABLET | Freq: Four times a day (QID) | ORAL | Status: DC | PRN
  Administered 2011-04-05: 650 mg via ORAL
  Filled 2011-04-03: qty 2

## 2011-04-03 MED ORDER — POLYVINYL ALCOHOL 1.4 % OP SOLN
1.0000 [drp] | Freq: Every day | OPHTHALMIC | Status: DC
  Filled 2011-04-03: qty 15

## 2011-04-03 MED ORDER — DIPHENHYDRAMINE HCL 12.5 MG/5ML PO ELIX: 12.5000 mg | ORAL_SOLUTION | ORAL | Status: DC | PRN

## 2011-04-03 MED ORDER — PROMETHAZINE HCL 25 MG/ML IJ SOLN: 6.2500 mg | INTRAMUSCULAR | Status: DC | PRN

## 2011-04-03 MED ORDER — ONDANSETRON HCL 4 MG/2ML IJ SOLN
INTRAMUSCULAR | Status: DC | PRN
  Administered 2011-04-03: 4 mg via INTRAVENOUS

## 2011-04-03 MED ORDER — FENTANYL CITRATE 0.05 MG/ML IJ SOLN
INTRAMUSCULAR | Status: DC | PRN
  Administered 2011-04-03: 50 ug via INTRAVENOUS
  Administered 2011-04-03: 100 ug via INTRAVENOUS
  Administered 2011-04-03 (×2): 50 ug via INTRAVENOUS

## 2011-04-03 MED ORDER — HYDROMORPHONE HCL PF 1 MG/ML IJ SOLN
INTRAMUSCULAR | Status: DC | PRN
  Administered 2011-04-03 (×2): 1 mg via INTRAVENOUS

## 2011-04-03 MED ORDER — BUPIVACAINE ON-Q PAIN PUMP (FOR ORDER SET NO CHG)
INJECTION | Status: DC
  Filled 2011-04-03: qty 1

## 2011-04-03 MED ORDER — DEXAMETHASONE SODIUM PHOSPHATE 10 MG/ML IJ SOLN: 10.0000 mg | Freq: Once | INTRAMUSCULAR | Status: DC

## 2011-04-03 MED ORDER — RIVAROXABAN 10 MG PO TABS
10.0000 mg | ORAL_TABLET | Freq: Every day | ORAL | Status: DC
  Administered 2011-04-04 – 2011-04-06 (×3): 10 mg via ORAL
  Filled 2011-04-03 (×3): qty 1

## 2011-04-03 MED ORDER — MIDAZOLAM HCL 5 MG/5ML IJ SOLN
INTRAMUSCULAR | Status: DC | PRN
  Administered 2011-04-03: 2 mg via INTRAVENOUS

## 2011-04-03 MED ORDER — LIDOCAINE HCL (CARDIAC) 20 MG/ML IV SOLN
INTRAVENOUS | Status: DC | PRN
  Administered 2011-04-03: 50 mg via INTRAVENOUS

## 2011-04-03 MED ORDER — SUCCINYLCHOLINE CHLORIDE 20 MG/ML IJ SOLN
INTRAMUSCULAR | Status: DC | PRN
  Administered 2011-04-03: 80 mg via INTRAVENOUS

## 2011-04-03 MED ORDER — ONDANSETRON HCL 4 MG PO TABS
4.0000 mg | ORAL_TABLET | Freq: Four times a day (QID) | ORAL | Status: DC | PRN
  Administered 2011-04-03: 4 mg via ORAL
  Filled 2011-04-03: qty 1

## 2011-04-03 SURGICAL SUPPLY — 55 items
BAG SPEC THK2 15X12 ZIP CLS (MISCELLANEOUS) ×1
BAG ZIPLOCK 12X15 (MISCELLANEOUS) ×2 IMPLANT
BANDAGE ELASTIC 6 VELCRO ST LF (GAUZE/BANDAGES/DRESSINGS) ×2 IMPLANT
BANDAGE ESMARK 6X9 LF (GAUZE/BANDAGES/DRESSINGS) ×1 IMPLANT
BLADE SAG 18X100X1.27 (BLADE) ×2 IMPLANT
BLADE SAW SGTL 11.0X1.19X90.0M (BLADE) ×2 IMPLANT
BNDG CMPR 9X6 STRL LF SNTH (GAUZE/BANDAGES/DRESSINGS) ×1
BNDG ESMARK 6X9 LF (GAUZE/BANDAGES/DRESSINGS) ×2
BOWL SMART MIX CTS (DISPOSABLE) ×2 IMPLANT
CATH KIT ON-Q SILVERSOAK 5 (CATHETERS) ×1 IMPLANT
CATH KIT ON-Q SILVERSOAK 5IN (CATHETERS) ×2 IMPLANT
CEMENT HV SMART SET (Cement) ×4 IMPLANT
CLOTH BEACON ORANGE TIMEOUT ST (SAFETY) ×2 IMPLANT
CUFF TOURN SGL QUICK 34 (TOURNIQUET CUFF) ×2
CUFF TRNQT CYL 34X4X40X1 (TOURNIQUET CUFF) ×1 IMPLANT
DRAPE EXTREMITY T 121X128X90 (DRAPE) ×2 IMPLANT
DRAPE POUCH INSTRU U-SHP 10X18 (DRAPES) ×2 IMPLANT
DRAPE U-SHAPE 47X51 STRL (DRAPES) ×2 IMPLANT
DRSG ADAPTIC 3X8 NADH LF (GAUZE/BANDAGES/DRESSINGS) ×2 IMPLANT
DURAPREP 26ML APPLICATOR (WOUND CARE) ×2 IMPLANT
ELECT REM PT RETURN 9FT ADLT (ELECTROSURGICAL) ×2
ELECTRODE REM PT RTRN 9FT ADLT (ELECTROSURGICAL) ×1 IMPLANT
EVACUATOR 1/8 PVC DRAIN (DRAIN) ×2 IMPLANT
FACESHIELD LNG OPTICON STERILE (SAFETY) ×10 IMPLANT
GAUZE SPONGE 4X4 12PLY STRL LF (GAUZE/BANDAGES/DRESSINGS) ×1 IMPLANT
GLOVE BIO SURGEON STRL SZ7.5 (GLOVE) ×2 IMPLANT
GLOVE BIO SURGEON STRL SZ8 (GLOVE) ×2 IMPLANT
GLOVE BIOGEL PI IND STRL 8 (GLOVE) ×2 IMPLANT
GLOVE BIOGEL PI INDICATOR 8 (GLOVE) ×2
GOWN STRL NON-REIN LRG LVL3 (GOWN DISPOSABLE) ×2 IMPLANT
GOWN STRL REIN XL XLG (GOWN DISPOSABLE) ×2 IMPLANT
HANDPIECE INTERPULSE COAX TIP (DISPOSABLE) ×2
IMMOBILIZER KNEE 20 (SOFTGOODS) ×2
IMMOBILIZER KNEE 20 THIGH 36 (SOFTGOODS) ×1 IMPLANT
KIT BASIN OR (CUSTOM PROCEDURE TRAY) ×2 IMPLANT
MANIFOLD NEPTUNE II (INSTRUMENTS) ×2 IMPLANT
NS IRRIG 1000ML POUR BTL (IV SOLUTION) ×2 IMPLANT
PACK TOTAL JOINT (CUSTOM PROCEDURE TRAY) ×2 IMPLANT
PAD ABD 5X9 TENDERSORB (GAUZE/BANDAGES/DRESSINGS) ×1 IMPLANT
PAD ABD 7.5X8 STRL (GAUZE/BANDAGES/DRESSINGS) ×2 IMPLANT
PADDING CAST COTTON 6X4 STRL (CAST SUPPLIES) ×6 IMPLANT
PADDING WEBRIL 6 STERILE (GAUZE/BANDAGES/DRESSINGS) ×2 IMPLANT
POSITIONER SURGICAL ARM (MISCELLANEOUS) ×2 IMPLANT
SET HNDPC FAN SPRY TIP SCT (DISPOSABLE) ×1 IMPLANT
SPONGE GAUZE 4X4 12PLY (GAUZE/BANDAGES/DRESSINGS) ×2 IMPLANT
STRIP CLOSURE SKIN 1/2X4 (GAUZE/BANDAGES/DRESSINGS) ×4 IMPLANT
SUCTION FRAZIER 12FR DISP (SUCTIONS) ×2 IMPLANT
SUT MNCRL AB 4-0 PS2 18 (SUTURE) ×2 IMPLANT
SUT PDS AB 1 CT1 27 (SUTURE) ×6 IMPLANT
SUT VIC AB 2-0 CT1 27 (SUTURE) ×6
SUT VIC AB 2-0 CT1 TAPERPNT 27 (SUTURE) ×3 IMPLANT
TOWEL OR 17X26 10 PK STRL BLUE (TOWEL DISPOSABLE) ×4 IMPLANT
TRAY FOLEY CATH 14FRSI W/METER (CATHETERS) ×2 IMPLANT
WATER STERILE IRR 1500ML POUR (IV SOLUTION) ×2 IMPLANT
WRAP KNEE MAXI GEL POST OP (GAUZE/BANDAGES/DRESSINGS) ×4 IMPLANT

## 2011-04-03 NOTE — Transfer of Care (Signed)
Immediate Anesthesia Transfer of Care Note  Patient: Robin Francis  Procedure(s) Performed: Procedure(s) (LRB): TOTAL KNEE ARTHROPLASTY (Left)  Patient Location: PACU  Anesthesia Type: General  Level of Consciousness: sedated, patient cooperative and responds to stimulaton  Airway & Oxygen Therapy: Patient Spontanous Breathing and Patient connected to face mask oxgen  Post-op Assessment: Report given to PACU RN and Post -op Vital signs reviewed and stable  Post vital signs: Reviewed and stable  Complications: No apparent anesthesia complications

## 2011-04-03 NOTE — Anesthesia Postprocedure Evaluation (Signed)
Anesthesia Post Note  Patient: Robin Francis  Procedure(s) Performed: Procedure(s) (LRB): TOTAL KNEE ARTHROPLASTY (Left)  Anesthesia type: General  Patient location: PACU  Post pain: Pain level controlled  Post assessment: Post-op Vital signs reviewed  Last Vitals:  Filed Vitals:   04/03/11 0803  BP: 147/88  Pulse: 70  Temp: 36.8 C  Resp: 18    Post vital signs: Reviewed  Level of consciousness: sedated  Complications: No apparent anesthesia complications

## 2011-04-03 NOTE — H&P (View-Only) (Signed)
Robin Francis  DOB: [birth year] Married / Language: English / Race: White / Female  Date of Admission:  Francis  Chief Complaint:  Left Knee Pain  History of Present Illness The patient is a 66 year old female who comes in today for a preoperative History and Physical. The patient is scheduled for a left total knee arthroplasty to be performed by Robin Francis. Robin Francis, Robin Francis. The patient is a 66 year old female who presents for her left knee pain. The patient is seen in referral from Robin Francis. The patient reports left knee (worse than right) symptoms including: pain, instability, giving way and weakness without any known injury.The patient feels that the symptoms are worsening. The patient has the current diagnosis of knee osteoarthritis. Prior to being seen today the patient was previously evaluated in this clinic. Previous work-up for this problem has included knee x-rays. Past treatment for this problem has included intra-articular injection of corticosteroids and visco injections. These have not provided very much relief. Current treatment includes nonsteroidal anti-inflammatory drugs in the form of Mobic. They have been treated conservatively in the past for the above stated problem and despite conservative measures, they continue to have progressive pain and severe functional limitations and dysfunction. They have failed non-operative management including home exercise, medications, and injections. It is felt that they would benefit from undergoing total joint replacement. Risks and benefits of the procedure have been discussed with the patient and they elect to proceed with surgery. There are no active contraindications to surgery such as ongoing infection or rapidly progressive neurological disease.  Allergies No Known Drug Allergies   Family History  Cancer. mother No pertinent family history Other medical problems. [place] parents died young so I  don't know of any complications Father. Deceased. suicide Mother. Pancreatic cancer  Social History Alcohol use. current drinker; drinks beer, wine and hard liquor; only occasionally per week Children. 2 Current work status. unemployed Drug/Alcohol Rehab (Currently). no Exercise. Exercises daily; does running / walking and gym / weights Illicit drug use. no Living situation. live with spouse Marital status. married Number of flights of stairs before winded. 2-3 Pain Contract. no Previously in rehab. no Tobacco use. Never smoker. never smoker Post-Surgical Plans. Plan to go home Advance Directives. Living Will, Healthcare POA  Medication History Multi-Vitamin ( Intravenous) Active. Vitamin C & E Combination (500-400MG -UNIT Capsule, Oral) Active. Vitamin D3 (1000UNIT Tablet, Oral) Active. B Complex ( Oral) Active. Cinnamon (500MG  Tablet, Oral) Active. Omega 3-6-9 Complex ( Oral) Active. Red Yeast Rice (600MG  Capsule, Oral) Active. Co Q-10 (120MG  Capsule, Oral) Active. Triple Flex ( Oral) Active. ZyrTEC ( Oral) Specific dose unknown - Active.  Past Surgical History Arthroscopy of Knee. bilateral, left - 10/96, right 06/2004 Spinal Fusion. Date: 07/21/1997. lower back Left Thumb Surgery  Past Medical Osteoarthritis Shingles Hemorrhoids Osteopenia Measles Mumps Rubella  Review of Systems General:Not Present- Chills, Fever, Night Sweats, Appetite Loss, Fatigue, Feeling sick, Weight Gain and Weight Loss. Skin:Not Present- Itching, Rash, Skin Color Changes, Ulcer, Psoriasis and Change in Hair or Nails. HEENT:Not Present- Sensitivity to light, Nose Bleed, Visual Loss, Decreased Hearing and Ringing in the Ears. Neck:Not Present- Swollen Glands and Neck Mass. Respiratory:Not Present- Shortness of breath, Snoring, Chronic Cough and Bloody sputum. Cardiovascular:Not Present- Shortness of Breath, Chest Pain, Swelling of Extremities, Leg Cramps and  Palpitations. Gastrointestinal:Present- Heartburn. Not Present- Bloody Stool, Abdominal Pain, Vomiting, Nausea and Incontinence of Stool. Female Genitourinary:Not Present- Blood in Urine, Irregular/missing periods,  Frequency, Incontinence and Nocturia. Musculoskeletal:Present- Joint Swelling and Joint Pain. Not Present- Muscle Weakness, Muscle Pain, Joint Stiffness and Back Pain. Neurological:Not Present- Tingling, Numbness, Burning, Tremor, Headaches and Dizziness. Psychiatric:Not Present- Anxiety, Depression and Memory Loss. Endocrine:Not Present- Cold Intolerance, Heat Intolerance, Excessive hunger and Excessive Thirst. Hematology:Not Present- Abnormal Bleeding, Abnormal bruising, Anemia and Blood Clots.  Vitals Weight: 150 lb Height: 59 in Body Surface Area: 1.68 m Body Mass Index: 30.3 kg/m Pulse: 76 (Regular) Resp.: 14 (Unlabored) BP: 132/82 (Sitting, Right Arm, Standard)  Physical Exam Patient is a 66 year old female with continued knee pain.  General Mental Status - Alert, cooperative and good historian. General Appearance- pleasant. Not in acute distress. Orientation- Oriented X3. Build & Nutrition- Petite, Well nourished and Well developed. Mental Status- good historian.  Head and Neck Head- normocephalic, atraumatic . Neck Global Assessment- supple. no bruit auscultated on the right, no bruit auscultated on the left and non-tender.  Eye Pupil- Bilateral- Regular and Round. Motion- Bilateral- EOMI.  Chest and Lung Exam Auscultation: Breath sounds:- clear at anterior chest wall and - clear at posterior chest wall. Adventitious sounds:- No Adventitious sounds.  Cardiovascular Auscultation:Rhythm- Regular rate and rhythm. Heart Sounds- S1 WNL and S2 WNL. Murmurs & Other Heart Sounds:Auscultation of the heart reveals - No Murmurs.  Abdomen Palpation/Percussion:Tenderness- Abdomen is non-tender to palpation. Rigidity  (guarding)- Abdomen is soft. Auscultation:Auscultation of the abdomen reveals - Bowel sounds normal.  Female Genitourinary Not done, not pertinent to present illness  Musculoskeletal  On physical exam a well developed female, alert and oriented in no apparent distress. Both hips, normal range of motion, no discomfort. Right knee no effusion, no deformity, range 0 to 135. Slight crepitus on range of motion. No instability noted. The left knee, slight varus, range about 5 to 120, moderate crepitus on range of motion, tender medial greater than lateral with no instability noted.  RADIOGRAPHS: AP both knees and lateral show bone on bone arthritis in the medial compartment with patellofemoral involvement also. She has some slight subluxation of her tibia on femur.  Assessment & Plan Osteoarthritis Left Knee  Patient is for a Left Total Knee Replacement by Dr. Lequita Halt.  Plan is to go home after the surgery.  PCP - Dr. Eleonore Chiquito - Patient has been seen preoperatively by Dr. Amador Cunas and felt ot be stable for surgery.  Avel Peace, PA-C

## 2011-04-03 NOTE — Interval H&P Note (Signed)
History and Physical Interval Note:  04/03/2011 9:40 AM  Robin Francis  has presented today for surgery, with the diagnosis of Osteoarthritis left knee   The various methods of treatment have been discussed with the patient and family. After consideration of risks, benefits and other options for treatment, the patient has consented to  Procedure(s) (LRB): TOTAL KNEE ARTHROPLASTY (Left) as a surgical intervention .  The patients' history has been reviewed, patient examined, no change in status, stable for surgery.  I have reviewed the patients' chart and labs.  Questions were answered to the patient's satisfaction.     Loanne Drilling

## 2011-04-03 NOTE — Op Note (Signed)
Pre-operative diagnosis- Osteoarthritis  Left knee(s)  Post-operative diagnosis- Osteoarthritis Left knee(s)  Procedure-  Left  Total Knee Arthroplasty  Surgeon- Robin Rankin. Adan Baehr, MD  Assistant- Dimitri Ped, PA-C   Anesthesia-  General EBL-* No blood loss amount entered *  Drains Hemovac  Tourniquet time-  Total Tourniquet Time Documented: Thigh (Left) - 38 minutes   Complications- None  Condition-PACU - hemodynamically stable.   Brief Clinical Note  Robin Francis is a 66 y.o. year old female with end stage OA of her left knee with progressively worsening pain and dysfunction. She has constant pain, with activity and at rest and significant functional deficits with difficulties even with ADLs. She has had extensive non-op management including analgesics, injections of cortisone and viscosupplements, and home exercise program, but remains in significant pain with significant dysfunction. Radiographs show bone on bone arthritis medial and patellofemoral. She presents now for left Total Knee Arthroplasty.    Procedure in detail---   The patient is brought into the operating room and positioned supine on the operating table. After successful administration of  General,   a tourniquet is placed high on the  Left thigh(s) and the lower extremity is prepped and draped in the usual sterile fashion. Time out is performed by the operating team and then the  Left lower extremity is wrapped in Esmarch, knee flexed and the tourniquet inflated to 300 mmHg.       A midline incision is made with a ten blade through the subcutaneous tissue to the level of the extensor mechanism. A fresh blade is used to make a medial parapatellar arthrotomy. Soft tissue over the proximal medial tibia is subperiosteally elevated to the joint line with a knife and into the semimembranosus bursa with a Cobb elevator. Soft tissue over the proximal lateral tibia is elevated with attention being paid to avoiding the patellar  tendon on the tibial tubercle. The patella is everted, knee flexed 90 degrees and the ACL and PCL are removed. Findings are bone on bone medial and patellofemoral with large medial and patellar osteophytes.        The drill is used to create a starting hole in the distal femur and the canal is thoroughly irrigated with sterile saline to remove the fatty contents. The 5 degree Left  valgus alignment guide is placed into the femoral canal and the distal femoral cutting block is pinned to remove 10 mm off the distal femur. Resection is made with an oscillating saw.      The tibia is subluxed forward and the menisci are removed. The extramedullary alignment guide is placed referencing proximally at the medial aspect of the tibial tubercle and distally along the second metatarsal axis and tibial crest. The block is pinned to remove 2mm off the more deficient medial  side. Resection is made with an oscillating saw. Size 2.5is the most appropriate size for the tibia and the proximal tibia is prepared with the modular drill and keel punch for that size.      The femoral sizing guide is placed and size 2.5 is most appropriate. Rotation is marked off the epicondylar axis and confirmed by creating a rectangular flexion gap at 90 degrees. The size 2.5 cutting block is pinned in this rotation and the anterior, posterior and chamfer cuts are made with the oscillating saw. The intercondylar block is then placed and that cut is made.      Trial size 2.5 tibial component, trial size 2.5 posterior stabilized femur and a  12.5  mm posterior stabilized rotating platform insert trial is placed. Full extension is achieved with excellent varus/valgus and anterior/posterior balance throughout full range of motion. The patella is everted and thickness measured to be 22  mm. Free hand resection is taken to 12 mm, a 35 template is placed, lug holes are drilled, trial patella is placed, and it tracks normally. Osteophytes are removed off  the posterior femur with the trial in place. All trials are removed and the cut bone surfaces prepared with pulsatile lavage. Cement is mixed and once ready for implantation, the size 2.5 tibial implant, size  2.5 posterior stabilized femoral component, and the size 35 patella are cemented in place and the patella is held with the clamp. The trial insert is placed and the knee held in full extension. All extruded cement is removed and once the cement is hard the permanent 12.5 mm posterior stabilized rotating platform insert is placed into the tibial tray.      The wound is copiously irrigated with saline solution and the extensor mechanism closed over a hemovac drain with #1 PDS suture. The tourniquet is released for a total tourniquet time of 38  minutes. Flexion against gravity is 135 degrees and the patella tracks normally. Subcutaneous tissue is closed with 2.0 vicryl and subcuticular with running 4.0 Monocryl. The catheter for the Marcaine pain pump is placed and the pump is initiated. The incision is cleaned and dried and steri-strips and a bulky sterile dressing are applied. The limb is placed into a knee immobilizer and the patient is awakened and transported to recovery in stable condition.      Please note that a surgical assistant was a medical necessity for this procedure in order to perform it in a safe and expeditious manner. Surgical assistant was necessary to retract the ligaments and vital neurovascular structures to prevent injury to them and also necessary for proper positioning of the limb to allow for anatomic placement of the prosthesis.   Robin Rankin Jesslynn Kruck, MD    04/03/2011, 10:57 AM

## 2011-04-03 NOTE — Anesthesia Postprocedure Evaluation (Signed)
Anesthesia Post Note  Patient: Robin Francis  Procedure(s) Performed: Procedure(s) (LRB): TOTAL KNEE ARTHROPLASTY (Left)  Anesthesia type: General  Patient location: PACU  Post pain: Pain level controlled  Post assessment: Post-op Vital signs reviewed  Last Vitals:  Filed Vitals:   04/03/11 2028  BP: 117/70  Pulse: 82  Temp: 36.3 C  Resp: 16    Post vital signs: Reviewed  Level of consciousness: sedated  Complications: No apparent anesthesia complications

## 2011-04-03 NOTE — Plan of Care (Signed)
Problem: Consults Goal: Diagnosis- Total Joint Replacement Primary Total Knee     

## 2011-04-03 NOTE — Anesthesia Preprocedure Evaluation (Signed)
Anesthesia Evaluation  Patient identified by MRN, date of birth, ID band Patient awake    Reviewed: Allergy & Precautions, H&P , NPO status , Patient's Chart, lab work & pertinent test results, reviewed documented beta blocker date and time   Airway Mallampati: II TM Distance: >3 FB Neck ROM: full    Dental No notable dental hx. (+) Teeth Intact and Dental Advidsory Given   Pulmonary neg pulmonary ROS,  clear to auscultation  Pulmonary exam normal       Cardiovascular Exercise Tolerance: Good neg cardio ROS regular Normal    Neuro/Psych Prior lumbar surgery with fusion, plate lumbar spine Negative Neurological ROS  Negative Psych ROS   GI/Hepatic negative GI ROS, Neg liver ROS,   Endo/Other  Negative Endocrine ROS  Renal/GU negative Renal ROS  Genitourinary negative   Musculoskeletal   Abdominal Normal abdominal exam  (+)   Peds  Hematology negative hematology ROS (+)   Anesthesia Other Findings   Reproductive/Obstetrics negative OB ROS                           Anesthesia Physical Anesthesia Plan  ASA: I  Anesthesia Plan: General   Post-op Pain Management:    Induction:   Airway Management Planned:   Additional Equipment:   Intra-op Plan:   Post-operative Plan:   Informed Consent: I have reviewed the patients History and Physical, chart, labs and discussed the procedure including the risks, benefits and alternatives for the proposed anesthesia with the patient or authorized representative who has indicated his/her understanding and acceptance.   Dental Advisory Given  Plan Discussed with: CRNA  Anesthesia Plan Comments:         Anesthesia Quick Evaluation

## 2011-04-04 LAB — CBC
HCT: 32.5 % — ABNORMAL LOW (ref 36.0–46.0)
MCH: 30 pg (ref 26.0–34.0)
MCV: 87.8 fL (ref 78.0–100.0)
RDW: 13.1 % (ref 11.5–15.5)
WBC: 12.3 10*3/uL — ABNORMAL HIGH (ref 4.0–10.5)

## 2011-04-04 LAB — BASIC METABOLIC PANEL
BUN: 10 mg/dL (ref 6–23)
Chloride: 99 mEq/L (ref 96–112)
Creatinine, Ser: 0.6 mg/dL (ref 0.50–1.10)
GFR calc Af Amer: >90 mL/min (ref >90)

## 2011-04-04 MED ORDER — MORPHINE SULFATE 2 MG/ML IJ SOLN
1.0000 mg | INTRAMUSCULAR | Status: DC | PRN
  Administered 2011-04-04 (×2): 2 mg via INTRAVENOUS
  Filled 2011-04-04 (×2): qty 1

## 2011-04-04 MED ORDER — HYDROMORPHONE HCL PF 2 MG/ML IJ SOLN
2.0000 mg | INTRAMUSCULAR | Status: DC | PRN
  Administered 2011-04-04 – 2011-04-05 (×2): 2 mg via INTRAVENOUS
  Filled 2011-04-04 (×2): qty 1

## 2011-04-04 NOTE — Evaluation (Signed)
Physical Therapy Evaluation Patient Details Name: Robin Francis MRN: [id number] DOB: May 26, 1945 Today's Date: 04/04/2011  Problem List:  Patient Active Problem List  Diagnoses  . MENOPAUSAL SYNDROME  . OSTEOARTHRITIS  . OA (osteoarthritis) of knee    Past Medical History:  Past Medical History  Diagnosis Date  . MENOPAUSAL SYNDROME 09/17/2008  . OSTEOARTHRITIS 09/17/2008   Past Surgical History:  Past Surgical History  Procedure Date  . Knee arthroscopy     left  . Back surgery   . Trigger finger release   . Tonsillectomy     as child    PT Assessment/Plan/Recommendation PT Assessment: Pt admitted for L TKA and presents with acute pain, decreased strength, and ROM, limiting gait and functional mobility. Pt will benefit from skilled PT to address these deficits and reduce falls risk and caregiver burden at home. Overall, pt is moving very well and was able to complete 10 SLR Independently and walk 120' with RW at eval. Educated pt on optimal positioning and alignment.  PT Recommendation/Assessment: Patient will need skilled PT in the acute care venue PT Problem List: Decreased strength;Decreased range of motion;Decreased activity tolerance;Decreased balance;Decreased mobility;Decreased knowledge of use of DME;Decreased safety awareness;Decreased knowledge of precautions;Pain Barriers to Discharge: None PT Therapy Diagnosis : Difficulty walking;Acute pain PT Plan PT Frequency: 7X/week PT Treatment/Interventions: DME instruction;Gait training;Stair training;Functional mobility training;Therapeutic activities;Therapeutic exercise;Balance training;Neuromuscular re-education;Patient/family education PT Recommendation Follow Up Recommendations: Home health PT;Supervision/Assistance - 24 hour Equipment Recommended: Other (comment) (Pt has standard walker, but needs 5" wheels. If unable to procure wheels only, pt will need RW for safe DC home) PT Goals  Acute Rehab PT Goals PT Goal  Formulation: With patient Time For Goal Achievement: 7 days Pt will go Supine/Side to Sit: with modified independence;with HOB 0 degrees PT Goal: Supine/Side to Sit - Progress: Goal set today Pt will go Sit to Supine/Side: with modified independence;with HOB 0 degrees PT Goal: Sit to Supine/Side - Progress: Goal set today Pt will go Sit to Stand: with supervision;with upper extremity assist PT Goal: Sit to Stand - Progress: Goal set today Pt will go Stand to Sit: with supervision;with upper extremity assist PT Goal: Stand to Sit - Progress: Goal set today Pt will Ambulate: >150 feet;with supervision;with least restrictive assistive device PT Goal: Ambulate - Progress: Goal set today Pt will Go Up / Down Stairs: 1-2 stairs;with min assist;with least restrictive assistive device (1 stair no rail) PT Goal: Up/Down Stairs - Progress: Goal set today Pt will Perform Home Exercise Program: Independently PT Goal: Perform Home Exercise Program - Progress: Goal set today  PT Evaluation Precautions/Restrictions  Precautions Precautions: Fall;Knee Restrictions Weight Bearing Restrictions: No Prior Functioning  Home Living Lives With: Spouse Receives Help From: Family Type of Home: House Home Layout: One level Home Access: Stairs to enter Entrance Stairs-Rails: None Entrance Stairs-Number of Steps: 1 (1 to enter and 1 into den) [employer] Shower/Tub: Health visitor: Standard (Has toilet riser) Bathroom Accessibility: Yes How Accessible: Accessible via walker Home Adaptive Equipment: Raised toilet seat with rails;Shower chair without back;Reacher;Walker - standard Prior Function Level of Independence: Independent with basic ADLs;Independent with gait;Independent with transfers Able to Take Stairs?: Yes Driving: Yes Comments: Walks  Cognition Cognition Arousal/Alertness: Awake/alert Overall Cognitive Status: Appears within functional limits for tasks assessed Orientation  Level: Oriented X4 Sensation/Coordination Sensation Light Touch: Appears Intact Coordination Gross Motor Movements are Fluid and Coordinated: Yes Fine Motor Movements are Fluid and Coordinated: Yes Extremity Assessment RLE Assessment RLE Assessment:  Within Functional Limits LLE Assessment LLE Assessment: Exceptions to WFL LLE PROM (degrees) LLE Overall PROM Comments: Grossly 0-60 deg assessed in supine with overpressure LLE Strength LLE Overall Strength Comments: Excellent quad strength, able to complete 10 SLR Indep, all other joints WFL Mobility (including Balance) Bed Mobility Bed Mobility: Yes Supine to Sit: 5: Supervision;HOB flat Supine to Sit Details (indicate cue type and reason): Cues for initiation and sequencing, increased time required Transfers Transfers: Yes Sit to Stand: 4: Min assist;With upper extremity assist;From bed Sit to Stand Details (indicate cue type and reason): Min A for steadying initially, cues for hand placement. Instructed pt in lateral weight shifts for increased proprioception and to increase comfort with WBAT Stand to Sit: 4: Min assist;With armrests;To chair/3-in-1 Stand to Sit Details: Cues for hand placement, A for steadying Ambulation/Gait Ambulation/Gait: Yes Ambulation/Gait Assistance: 4: Min assist Ambulation/Gait Assistance Details (indicate cue type and reason): Cues for gait pattern, RW management, and RW proximity to self Ambulation Distance (Feet): 120 Feet Assistive device: Rolling walker Gait Pattern: Step-to pattern;Decreased stride length;Decreased weight shift to right Stairs: No Wheelchair Mobility Wheelchair Mobility: No  Posture/Postural Control Posture/Postural Control: No significant limitations Balance Balance Assessed: No Exercise  Total Joint Exercises Ankle Circles/Pumps: Supine;20 reps;Both;Strengthening;AROM Quad Sets: Supine;10 reps;Both;Strengthening;AROM Short Arc Quad: Supine;10  reps;Left;Strengthening;AROM Heel Slides: 10 reps;Left;Strengthening;AAROM;Supine Straight Leg Raises: Supine;10 reps;Left;Strengthening;AROM Knee Flexion: Supine;10 reps;Left;AAROM End of Session PT - End of Session Equipment Utilized During Treatment: Gait belt Activity Tolerance: Patient tolerated treatment well Patient left: in chair;with call bell in reach;with family/visitor present Nurse Communication: Mobility status for transfers;Mobility status for ambulation General Behavior During Session: [facility] for tasks performed Cognition: [facility] for tasks performed  [facility], PT  04/04/2011, 9:39 AM

## 2011-04-04 NOTE — Evaluation (Signed)
Occupational Therapy Evaluation Patient Details Name: Robin Francis MRN: [id number] DOB: January 21, 1946 Today's Date: 04/04/2011  Problem List:  Patient Active Problem List  Diagnoses  . MENOPAUSAL SYNDROME  . OSTEOARTHRITIS  . OA (osteoarthritis) of knee    Past Medical History:  Past Medical History  Diagnosis Date  . MENOPAUSAL SYNDROME 09/17/2008  . OSTEOARTHRITIS 09/17/2008   Past Surgical History:  Past Surgical History  Procedure Date  . Knee arthroscopy     left  . Back surgery   . Trigger finger release   . Tonsillectomy     as child    OT Assessment/Plan/Recommendation OT Assessment Clinical Impression Statement: Pt progressing well and will benefit from skilled OT services to increase her safety and independence with ADL for discharge home with family assist. OT Recommendation/Assessment: Patient will need skilled OT in the acute care venue OT Problem List: Decreased strength;Decreased knowledge of use of DME or AE OT Therapy Diagnosis : Generalized weakness OT Plan OT Frequency: Min 2X/week OT Treatment/Interventions: Self-care/ADL training;Therapeutic activities;DME and/or AE instruction;Patient/family education OT Recommendation Follow Up Recommendations: No OT follow up Equipment Recommended: None recommended by OT Individuals Consulted Consulted and Agree with Results and Recommendations: Family member/caregiver;Patient Family Member Consulted: spouse OT Goals Acute Rehab OT Goals OT Goal Formulation: With patient/family Time For Goal Achievement: 7 days ADL Goals Pt Will Perform Grooming: with supervision;Standing at sink ADL Goal: Grooming - Progress: Goal set today Pt Will Perform Lower Body Bathing: with supervision;Sit to stand from chair;Sit to stand from bed ADL Goal: Lower Body Bathing - Progress: Goal set today Pt Will Perform Lower Body Dressing: with supervision;Sit to stand from chair;Sit to stand from bed ADL Goal: Lower Body Dressing -  Progress: Goal set today Pt Will Transfer to Toilet: with supervision;with DME;Ambulation;Other (comment) (toilet riser versus standard with vanity) ADL Goal: Toilet Transfer - Progress: Goal set today Pt Will Perform Toileting - Clothing Manipulation: with supervision;Standing ADL Goal: Toileting - Clothing Manipulation - Progress: Goal set today Pt Will Perform Toileting - Hygiene: with supervision;Sit to stand from 3-in-1/toilet ADL Goal: Toileting - Hygiene - Progress: Goal set today Pt Will Perform Tub/Shower Transfer: Shower transfer;with supervision;with DME ADL Goal: Tub/Shower Transfer - Progress: Goal set today  OT Evaluation Precautions/Restrictions  Precautions Precautions: Knee Restrictions Weight Bearing Restrictions: No Prior Functioning Home Living Lives With: Spouse Receives Help From: Family Type of Home: House Home Layout: One level Home Access: Stairs to enter Entrance Stairs-Rails: None Entrance Stairs-Number of Steps: 1 (1 to enter and 1 into den) [employer] Shower/Tub: Health visitor: Standard (Has toilet riser) Bathroom Accessibility: Yes How Accessible: Accessible via walker Home Adaptive Equipment: Raised toilet seat with rails;Shower chair without back;Reacher;Walker - standard Prior Function Level of Independence: Independent with basic ADLs;Independent with gait;Independent with transfers Able to Take Stairs?: Yes Driving: Yes Comments: Walks  ADL ADL Eating/Feeding: Simulated;Independent Where Assessed - Eating/Feeding: Chair Grooming: Simulated;Set up Where Assessed - Grooming: Sitting, chair Upper Body Bathing: Performed;Chest;Right arm;Left arm;Abdomen;Set up Where Assessed - Upper Body Bathing: Sitting, chair;Unsupported Lower Body Bathing: Performed;Minimal assistance Lower Body Bathing Details (indicate cue type and reason): min assist left foot for thoroughness. Where Assessed - Lower Body Bathing: Sit to stand from  chair Upper Body Dressing: Simulated;Set up Where Assessed - Upper Body Dressing: Sitting, chair;Unsupported Lower Body Dressing: Simulated;Moderate assistance Where Assessed - Lower Body Dressing: Sit to stand from chair Toilet Transfer: Simulated;Minimal assistance Toilet Transfer Details (indicate cue type and reason): simulated sit to stand aspect  and a few steps forward during bathing task. Did not assess toilet transfer. Toileting - Clothing Manipulation: Simulated;Minimal assistance Where Assessed - Glass blower/designer Manipulation: Standing Toileting - Hygiene: Simulated;Minimal assistance Where Assessed - Toileting Hygiene: Standing Tub/Shower Transfer: Not assessed Tub/Shower Transfer Method: Not assessed Equipment Used: Rolling walker ADL Comments: Husband present for eval. Pt progressing well. Per PT has already done SLR and does not need KI. Vision/Perception  Vision - History Baseline Vision: No visual deficits Cognition Cognition Arousal/Alertness: Awake/alert Overall Cognitive Status: Appears within functional limits for tasks assessed Orientation Level: Oriented X4 Sensation/Coordination Sensation Light Touch: Appears Intact Coordination Gross Motor Movements are Fluid and Coordinated: Yes Fine Motor Movements are Fluid and Coordinated: Yes Extremity Assessment RUE Assessment RUE Assessment: Within Functional Limits LUE Assessment LUE Assessment: Within Functional Limits Mobility  Bed Mobility Bed Mobility: Yes Supine to Sit: 5: Supervision;HOB flat Supine to Sit Details (indicate cue type and reason): Cues for initiation and sequencing, increased time required Transfers Sit to Stand: 4: Min assist;From chair/3-in-1;With upper extremity assist Sit to Stand Details (indicate cue type and reason): min verbal cues and for slight balance support Stand to Sit: 4: Min assist;To chair/3-in-1;With upper extremity assist Stand to Sit Details: min verbal cues for hand  placement End of Session OT - End of Session Activity Tolerance: Patient tolerated treatment well Patient left: in chair;with call bell in reach;with family/visitor present General Behavior During Session: [facility] for tasks performed Cognition: [facility] for tasks performed   [name] [phone] 04/04/2011, 11:28 AM

## 2011-04-04 NOTE — Progress Notes (Signed)
Subjective: 1 Day Post-Op Procedure(s) (LRB): TOTAL KNEE ARTHROPLASTY (Left) Patient reports pain as mild.   Patient seen in rounds with Dr. Lequita Halt. Patient doing well this morning. We will start therapy today. Plan is to go home after hospital stay.  Objective: Vital signs in last 24 hours: Temp:  [97.4 F (36.3 C)-98.3 F (36.8 C)] 98.3 F (36.8 C) (02/26 0535) Pulse Rate:  [70-85] 80  (02/26 0535) Resp:  [11-18] 16  (02/26 0535) BP: (110-147)/(68-90) 126/68 mmHg (02/26 0535) SpO2:  [92 %-100 %] 96 % (02/26 0535) Weight:  [68.04 kg (150 lb)] 68.04 kg (150 lb) (02/25 1338)  Intake/Output from previous day:  Intake/Output Summary (Last 24 hours) at 04/04/11 0745 Last data filed at 04/04/11 1610  Gross per 24 hour  Intake 4938.25 ml  Output   2500 ml  Net 2438.25 ml    Intake/Output this shift:    Labs:  Basename 04/04/11 0402  HGB 11.1*    Basename 04/04/11 0402  WBC 12.3*  RBC 3.70*  HCT 32.5*  PLT 283    Basename 04/04/11 0402  NA 133*  K 5.0  CL 99  CO2 27  BUN 10  CREATININE 0.60  GLUCOSE 147*  CALCIUM 9.6   No results found for this basename: LABPT:2,INR:2 in the last 72 hours  Exam - Neurovascular intact Sensation intact distally Dressing - clean, dry, no drainage Motor function intact - moving foot and toes well on exam.  Hemovac pulled without difficulty.  Past Medical History  Diagnosis Date  . MENOPAUSAL SYNDROME 09/17/2008  . OSTEOARTHRITIS 09/17/2008    Assessment/Plan: 1 Day Post-Op Procedure(s) (LRB): TOTAL KNEE ARTHROPLASTY (Left) Principal Problem:  *OA (osteoarthritis) of knee   Advance diet Up with therapy Continue foley due to strict I&O and urinary output monitoring Plan Home with Home Health  DVT Prophylaxis - Xarelto  Protocol Weight-Bearing as tolerated to left leg Keep foley until tomorrow. No vaccines. D/C PCA Morphine, Change to IV push D/C O2 and Pulse OX and try on Room Air  Leven Hoel,  Shawnte Winton 04/04/2011, 7:45 AM

## 2011-04-04 NOTE — Progress Notes (Signed)
Physical Therapy Treatment Patient Details Name: NECHAMA ESCUTIA MRN: [id number] DOB: [birth year] Today's Date: 04/04/2011  PT Assessment/Plan  PT - Assessment/Plan Comments on Treatment Session: Pt tolerating therapy very well with good return of strength amd ROM, needing only min cues for safety. Pt is still limited by activity tolerance, but motivated for therapy. Pt given handout for HEP PT Plan: Discharge plan remains appropriate PT Frequency: 7X/week Follow Up Recommendations: Home health PT;Supervision/Assistance - 24 hour Equipment Recommended: Other (comment) (5" wheels for standard walker, otherwise RW) PT Goals  Acute Rehab PT Goals PT Goal: Sit to Supine/Side - Progress: Progressing toward goal PT Goal: Sit to Stand - Progress: Progressing toward goal PT Goal: Stand to Sit - Progress: Progressing toward goal PT Goal: Ambulate - Progress: Progressing toward goal PT Goal: Perform Home Exercise Program - Progress: Progressing toward goal  PT Treatment Precautions/Restrictions  Precautions Precautions: Knee Restrictions Weight Bearing Restrictions: No Mobility (including Balance) Bed Mobility Bed Mobility: Yes Sit to Supine: 5: Supervision Sit to Supine - Details (indicate cue type and reason): Cues for technique Transfers Transfers: Yes Sit to Stand: With upper extremity assist;From chair/3-in-1;4: Min assist Sit to Stand Details (indicate cue type and reason): Cues for safe hand placement Stand to Sit: With upper extremity assist;To bed;4: Min assist Stand to Sit Details: Cues for safe hand placement, steadying assist Ambulation/Gait Ambulation/Gait: Yes Ambulation/Gait Assistance: 4: Min assist Ambulation/Gait Assistance Details (indicate cue type and reason): Cues for RW proximity to self, to decrease L step length, and to attempt continuous gait as able.  Ambulation Distance (Feet): 120 Feet Assistive device: Rolling walker Gait Pattern: Step-to pattern;Decreased  stride length;Decreased weight shift to right    Exercise  Total Joint Exercises Towel Squeeze: Supine;10 reps;Both;Strengthening;AROM Hip ABduction/ADduction: Supine;10 reps;Left;Strengthening;AROM Straight Leg Raises: Supine;10 reps;Left;Strengthening;AROM Long Arc Quad: Seated;10 reps;Left;Strengthening;AROM Knee Flexion: Seated;10 reps;Left;AAROM Marching in Standing: Standing;10 reps;Left;Strengthening;AROM End of Session PT - End of Session Equipment Utilized During Treatment: Gait belt Activity Tolerance: Patient tolerated treatment well Patient left: in bed;with call bell in reach (called [employer] for CPM set-up) Nurse Communication: Mobility status for transfers General Behavior During Session: [facility] for tasks performed Cognition: [facility] for tasks performed  [facility], PT 04/04/2011, 1:37 PM

## 2011-04-04 NOTE — Progress Notes (Signed)
  CARE MANAGEMENT NOTE 04/04/2011  Patient:  Robin Francis, Robin Francis   Account Number:  [id number]  Date Initiated:  04/04/2011  Documentation initiated by:  Colleen Can  Subjective/Objective Assessment:   dx osteoarthritis left knee; total knee replacemnt     Action/Plan:   Cm spoke with patient. Plans are that patient will return to her home in [place], [place] where her spouse will be caregiver. She already has 3n1 but will need RW. Wants hh agency in network. List placed in shadow chart   Anticipated DC Date:  04/06/2011   Anticipated DC Plan:  HOME W HOME HEALTH SERVICES  In-house referral  NA      DC Planning Services  CM consult      [facility] Choice  HOME HEALTH   Choice offered to / List presented to:  C-1 Patient   DME arranged  3-N-1      DME agency  [facility]     HH arranged  HH-2 PT      [facility] agency  [facility]   Status of service:  In process, will continue to follow Medicare Important Message given?  NO (If response is "NO", the following Medicare IM given date fields will be blank) Date Medicare IM given:   Date Additional Medicare IM given:    Discharge Disposition:    Per UR Regulation:    Comments:  04/04/2011 [facility] contacted and can provide HHPT upon pt's discharge. They are network providers. Pt wants to use [facility] for hh services. [facility] DME rep notified of pt's need for RW. Will deliver to rm

## 2011-04-05 LAB — BASIC METABOLIC PANEL
CO2: 30 mEq/L (ref 19–32)
Calcium: 8.9 mg/dL (ref 8.4–10.5)
Chloride: 101 mEq/L (ref 96–112)
GFR calc Af Amer: >90 mL/min (ref >90)
Sodium: 136 mEq/L (ref 135–145)

## 2011-04-05 LAB — CBC
Platelets: 253 10*3/uL (ref 150–400)
RBC: 3.34 MIL/uL — ABNORMAL LOW (ref 3.87–5.11)
RDW: 13.4 % (ref 11.5–15.5)
WBC: 14.2 10*3/uL — ABNORMAL HIGH (ref 4.0–10.5)

## 2011-04-05 NOTE — Progress Notes (Signed)
Physical Therapy Treatment Patient Details Name: Robin Francis MRN: [id number] DOB: [birth year] Today's Date: 04/05/2011   04/05/11 1500  PT Visit Information  Last PT Received On 04/05/11  Precautions  Precautions Knee  Required Braces or Orthoses Yes  Knee Immobilizer On when out of bed or walking  Restrictions  Weight Bearing Restrictions Yes  LLE Weight Bearing WBAT  Bed Mobility  Bed Mobility Yes  Supine to Sit 6: Modified independent (Device/Increase time)  Sit to Supine 6: Modified independent (Device/Increase time)  Transfers  Transfers Yes  Sit to Stand 5: Supervision;With upper extremity assist;From bed  Sit to Stand Details (indicate cue type and reason) VC for proper hand placement and sequencing into standing.  Stand to Sit 5: Supervision;With upper extremity assist;To bed  Stand to Sit Details VC for hand placement. Pt able to control descent  Ambulation/Gait  Ambulation/Gait Yes  Ambulation/Gait Assistance 5: Supervision  Ambulation/Gait Assistance Details (indicate cue type and reason) VC for increased heel strike as well as knee flexion during gait.  Ambulation Distance (Feet) 100 Feet  Assistive device Rolling walker  Gait Pattern Step-through pattern;Decreased step length - right;Decreased step length - left  Gait velocity Decreased gait speed  Stairs No  Exercises  Exercises Total Joint  Total Joint Exercises  Heel Slides AROM;Strengthening;Left;10 reps;Supine  Straight Leg Raises AROM;Strengthening;Left;10 reps;Supine  Long [employer] Seated;10 reps;Left;Strengthening;AROM  PT - End of Session  Equipment Utilized During Treatment Gait belt  Activity Tolerance Patient tolerated treatment well  Patient left in bed;with call bell in reach  Nurse Communication Mobility status for transfers  General  Behavior During Session [facility] for tasks performed  Cognition [facility] for tasks performed  PT - Assessment/Plan  Comments on Treatment Session Pt progressing well.  Pt required motivation to perform therapy but is supervision level with all mobility. Focus on increased knee extension during functional activity  PT Plan Discharge plan remains appropriate;Frequency remains appropriate  PT Frequency 7X/week  Follow Up Recommendations Home health PT;Supervision/Assistance - 24 hour  Equipment Recommended Rolling walker with 5" wheels (or wheels for standard walker)  Acute Rehab PT Goals  PT Goal Formulation With patient  PT Goal: Supine/Side to Sit - Progress Met  PT Goal: Sit to Supine/Side - Progress Met  PT Goal: Sit to Stand - Progress Met  PT Goal: Stand to Sit - Progress Met  PT Goal: Ambulate - Progress Progressing toward goal    04/05/2011 Milana Kidney DPT PAGER: [phone] OFFICE: [phone]

## 2011-04-05 NOTE — Progress Notes (Signed)
Comments:  04/05/2011 Raynelle Bring BSN CCM DME -RW has been delivered to patient's room.

## 2011-04-05 NOTE — Progress Notes (Signed)
04/05/11 1200  PT Visit Information  Last PT Received On 04/05/11  Precautions  Precautions Knee  Required Braces or Orthoses No  Restrictions  Weight Bearing Restrictions No  LLE Weight Bearing WBAT  Bed Mobility  Bed Mobility Yes  Supine to Sit 5: Supervision;HOB flat  Sit to Supine 6: Modified independent (Device/Increase time)  Transfers  Transfers Yes  Sit to Stand 5: Supervision;With upper extremity assist;With armrests  Sit to Stand Details (indicate cue type and reason) cues for hand placement  Stand to Sit 5: Supervision;With upper extremity assist  Stand to Sit Details Cues for hand placement  Ambulation/Gait  Ambulation/Gait Yes  Ambulation/Gait Assistance 5: Supervision  Ambulation Distance (Feet) 80 Feet  Assistive device Rolling walker  Gait Pattern Step-through pattern;Decreased step length - right;Decreased step length - left  Stairs Yes  Stairs Assistance 4: Min assist  Stair Management Technique Forwards;With walker  Number of Stairs 1   Total Joint Exercises  Ankle Circles/Pumps Supine;20 reps;Strengthening;AROM;Left  Quad Sets Supine;10 reps;Strengthening;AROM;Left  Heel Slides 10 reps;Left;Strengthening;AAROM;Supine  Straight Leg Raises Supine;10 reps;Left;Strengthening;AROM  Hip ABduction/ADduction AAROM;Strengthening;Left;10 reps;Supine  PT - End of Session  Equipment Utilized During Treatment Gait belt  Activity Tolerance Patient tolerated treatment well  Patient left in chair;with call bell in reach  Acute Rehab PT Goals  PT Goal: Sit to Supine/Side - Progress Progressing toward goal  PT Goal: Sit to Stand - Progress Progressing toward goal  PT Goal: Stand to Sit - Progress Progressing toward goal  PT Goal: Ambulate - Progress Progressing toward goal  PT Goal: Up/Down Stairs - Progress Progressing toward goal  PT Goal: Perform Home Exercise Program - Progress Progressing toward goal   PT treatment note performed by Hortencia Conradi, PTA for morning  session.

## 2011-04-05 NOTE — Progress Notes (Signed)
Subjective: 2 Days Post-Op Procedure(s) (LRB): TOTAL KNEE ARTHROPLASTY (Left) Patient reports pain as mild but moderate yesterday. Patient is sitting up in the chair.  Little better today.  Plan home probably tomorrow.  Objective: Vital signs in last 24 hours: Temp:  [98.1 F (36.7 C)-99.8 F (37.7 C)] 99.4 F (37.4 C) (02/27 0628) Pulse Rate:  [79-117] 106  (02/27 0628) Resp:  [18] 18  (02/27 0628) BP: (106-150)/(73-91) 106/74 mmHg (02/27 0628) SpO2:  [91 %-98 %] 91 % (02/27 0628)  Intake/Output from previous day:  Intake/Output Summary (Last 24 hours) at 04/05/11 1204 Last data filed at 04/05/11 0732  Gross per 24 hour  Intake    960 ml  Output   2200 ml  Net  -1240 ml    Intake/Output this shift: Total I/O In: 240 [P.O.:240] Out: -   Labs:  Basename 04/05/11 0413 04/04/11 0402  HGB 10.1* 11.1*    Basename 04/05/11 0413 04/04/11 0402  WBC 14.2* 12.3*  RBC 3.34* 3.70*  HCT 29.8* 32.5*  PLT 253 283    Basename 04/05/11 0413 04/04/11 0402  NA 136 133*  K 4.6 5.0  CL 101 99  CO2 30 27  BUN 14 10  CREATININE 0.78 0.60  GLUCOSE 142* 147*  CALCIUM 8.9 9.6   No results found for this basename: LABPT:2,INR:2 in the last 72 hours  Exam - Neurovascular intact Sensation intact distally Dressing/Incision - clean, dry, no drainage Motor function intact - moving foot and toes well on exam.   Past Medical History  Diagnosis Date  . MENOPAUSAL SYNDROME 09/17/2008  . OSTEOARTHRITIS 09/17/2008    Assessment/Plan: 2 Days Post-Op Procedure(s) (LRB): TOTAL KNEE ARTHROPLASTY (Left) Principal Problem:  *OA (osteoarthritis) of knee   Advance diet Up with therapy D/C IV fluids Plan for discharge tomorrow Discharge home with home health  DVT Prophylaxis - Xarelto Protocol Weight-Bearing as tolerated to left leg  Lujean Ebright 04/05/2011, 12:04 PM

## 2011-04-05 NOTE — Progress Notes (Signed)
Occupational Therapy Note Attempted to see patient but she states she had a bad night with pain and needs to have her pain meds first. Will try back later as able. Judithann Sauger OTR/L [phone] 04/05/2011

## 2011-04-06 ENCOUNTER — Encounter (HOSPITAL_COMMUNITY): Payer: Self-pay | Admitting: Orthopedic Surgery

## 2011-04-06 LAB — CBC
HCT: 26.7 % — ABNORMAL LOW (ref 36.0–46.0)
MCV: 88.4 fL (ref 78.0–100.0)
Platelets: 216 10*3/uL (ref 150–400)
RBC: 3.02 MIL/uL — ABNORMAL LOW (ref 3.87–5.11)
RDW: 13.1 % (ref 11.5–15.5)
WBC: 10.7 10*3/uL — ABNORMAL HIGH (ref 4.0–10.5)

## 2011-04-06 MED ORDER — BISACODYL 10 MG RE SUPP
10.0000 mg | Freq: Every day | RECTAL | Status: DC | PRN
  Administered 2011-04-06: 10 mg via RECTAL
  Filled 2011-04-06: qty 1

## 2011-04-06 MED ORDER — POLYSACCHARIDE IRON COMPLEX 150 MG PO CAPS: 150.0000 mg | ORAL_CAPSULE | Freq: Every day | ORAL | Status: DC

## 2011-04-06 MED ORDER — RIVAROXABAN 10 MG PO TABS: 10.0000 mg | ORAL_TABLET | Freq: Every day | ORAL | Status: DC

## 2011-04-06 MED ORDER — POLYSACCHARIDE IRON COMPLEX 150 MG PO CAPS
150.0000 mg | ORAL_CAPSULE | Freq: Every day | ORAL | Status: DC
  Administered 2011-04-06: 150 mg via ORAL
  Filled 2011-04-06: qty 1

## 2011-04-06 MED ORDER — METHOCARBAMOL 500 MG PO TABS: 500.0000 mg | ORAL_TABLET | Freq: Four times a day (QID) | ORAL | Status: AC | PRN

## 2011-04-06 MED ORDER — OXYCODONE HCL 5 MG PO TABS: 5.0000 mg | ORAL_TABLET | ORAL | Status: AC | PRN

## 2011-04-06 NOTE — Progress Notes (Signed)
CARE MANAGEMENT NOTE 04/06/2011  Patient:  Robin Francis, Robin Francis   Account Number:  [id number]  Date Initiated:  04/04/2011  Documentation initiated by:  Colleen Can  Subjective/Objective Assessment:   dx osteoarthritis left knee; total knee replacemnt     Action/Plan:   Cm spoke with patient. Plans are that patient will return to her home in [place], [place] where her spouse will be caregiver. She already has 3n1 but will need RW. Wants hh agency in network. List placed in shadow chart   Anticipated DC Date:  04/06/2011   Anticipated DC Plan:  HOME W HOME HEALTH SERVICES  In-house referral  NA      DC Planning Services  CM consult      [facility] Choice  HOME HEALTH   Choice offered to / List presented to:  C-1 Patient   DME arranged  [facility]      DME agency  [facility]     HH arranged  HH-2 PT      [facility] agency  [facility]   Status of service:  Completed, signed off Medicare Important Message given?  NO (If response is "NO", the following Medicare IM given date fields will be blank) Date Medicare IM given:   Date Additional Medicare IM given:    Discharge Disposition:  HOME W HOME HEALTH SERVICES  Per UR Regulation:    Comments:  04/06/2011 Raynelle Bring BSN CCM PT FOR DISCHARGE TODAY. [facility] NOTIFIED OF DISCHARGE. HH SERVICES WILL START TOMORROW 04/07/11 .

## 2011-04-06 NOTE — Discharge Instructions (Signed)
Knee Rehabilitation, Guidelines Following Surgery Results after knee surgery are often greatly improved when you follow the exercise, range of motion and muscle strengthening exercises prescribed by your doctor. Safety measures are also important to protect the knee from further injury. Any time any of these exercises cause you to have increased pain or swelling in your knee joint, decrease the amount until you are comfortable again and slowly increase them. If you have problems or questions, call your caregiver or physical therapist for advice. HOME CARE INSTRUCTIONS   Remove items at home which could result in a fall. This includes throw rugs or furniture in walking pathways.   Continue medications as instructed.   You may shower or take tub baths when your staples or stitches are removed or as instructed.   Walk using crutches or walker as instructed.   Put weight on your legs and walk as much as is comfortable.   You may resume a sexual relationship in one month or when given the OK by your doctor.   Return to work as instructed by your doctor.   Do not drive a car for 6 weeks or as instructed.   Wear elastic stockings until instructed not to.   Make sure you keep all of your appointments after your operation with all of your doctors and caregivers.  RANGE OF MOTION AND STRENGTHENING EXERCISES Rehabilitation of the knee is important following a knee injury or an operation. After just a few days of immobilization, the muscles of the thigh which control the knee become weakened and shrink (atrophy). Knee exercises are designed to build up the tone and strength of the thigh muscles and to improve knee motion. Often times heat used for twenty to thirty minutes before working out will loosen up your tissues and help with improving the range of motion. These exercises can be done on a training (exercise) mat, on the floor, on a table or on a bed. Use what ever works the best and is most  comfortable for you Knee exercises include:  Leg Lifts - While your knee is still immobilized in a splint or cast, you can do straight leg raises. Lift the leg to 60 degrees, hold for 3 sec, and slowly lower the leg. Repeat 10-20 times 2-3 times daily. Perform this exercise against resistance later as your knee gets better.   Quad and Hamstring Sets - Tighten up the muscle on the front of the thigh (Quad) and hold for 5-10 sec. Repeat this 10-20 times hourly. Hamstring sets are done by pushing the foot backward against an object and holding for 5-10 sec. Repeat as with quad sets.   A rehabilitation program following serious knee injuries can speed recovery and prevent re-injury in the future due to weakened muscles. Contact your doctor or a physical therapist for more information on knee rehabilitation. MAKE SURE YOU:   Understand these instructions.   Will watch your condition.   Will get help right away if you are not doing well or get worse.  Document Released: 01/23/2005 Document Revised: 10/05/2010 Document Reviewed: 07/13/2006 [employer] Patient Information 2012 [employer].  Pick up stool softner and laxative for home. Do not submerge incision under water. May shower. Continue to use ice for pain and swelling from surgery.  

## 2011-04-06 NOTE — Progress Notes (Signed)
Occupational Therapy Treatment Patient Details Name: Robin Francis MRN: [id number] DOB: Nov 10, 1945 Today's Date: 04/06/2011  OT Assessment/Plan OT Assessment/Plan Comments on Treatment Session: Pt doing well and is to discharge today. Spouse can assist at discharge. She has a shower chair and advised patient to use shower seat initially until strong enough to stand in the shower without UE support.  OT Plan: Discharge plan remains appropriate OT Frequency: Min 2X/week Follow Up Recommendations: No OT follow up Equipment Recommended: Rolling walker with 5" wheels OT Goals ADL Goals ADL Goal: Tub/Shower Transfer - Progress: Progressing toward goals  OT Treatment Precautions/Restrictions  Precautions Precautions: Knee Precaution Comments: Pt/spouse instructed to avoid kneeling on her TKR Required Braces or Orthoses: No Knee Immobilizer: Other (comment) (pt able to SLR. d/c KI) Restrictions Weight Bearing Restrictions: No LLE Weight Bearing: Weight bearing as tolerated   ADL ADL Tub/Shower Transfer: Simulated;Minimal assistance Tub/Shower Transfer Details (indicate cue type and reason): min assist to stabilize and assist with RW over ledge Equipment Used: Rolling walker ADL Comments: Pt has a toilet riser with handles at home. Pt is 4 foot 9 and have concerns that toilet with riser on top will be too high and pt will have to slide onto her feet when standing up. Discussed safety concerns with patient and option of trying standard height commode with vanity beside to help push up. Pt declines trying standard commode as she doesnt want to use the powder bath with standard commode at home. She wants to use the master bath toilet. It does not have a vanity beside commode. Explained where patient can obtain a 3in1 later if needed and also cautioned patient to have spouse assist safely on and off toilet with riser considering her feet may not touch the floor. Pt declines further toileting  practice here and states she feels it will be ok.  Mobility  Bed Mobility Bed Mobility: Yes Supine to Sit: 6: Modified independent (Device/Increase time) Supine to Sit Details (indicate cue type and reason): increased time Sit to Supine: 6: Modified independent (Device/Increase time) Sit to Supine - Details (indicate cue type and reason): increased time Transfers Sit to Stand: 6: Modified independent (Device/Increase time);From bed;With upper extremity assist Sit to Stand Details (indicate cue type and reason): good safety tech Stand to Sit: 6: Modified independent (Device/Increase time);To chair/3-in-1;With upper extremity assist Stand to Sit Details: good safety tech End of Session OT - End of Session Equipment Utilized During Treatment: Other (comment) (rolling walker) Activity Tolerance: Patient tolerated treatment well Patient left: in chair;with call bell in reach General Behavior During Session: [facility] for tasks performed Cognition: [facility] for tasks performed  Robin Francis  [phone] 04/06/2011, 11:52 AM

## 2011-04-06 NOTE — Progress Notes (Signed)
Physical Therapy Treatment Patient Details Name: Robin Francis MRN: [id number] DOB: 14-Sep-1945 Today's Date: 04/06/2011  L TKR POD #3 10:15 - 10:55 1 gt  1 te  1 ta  PT Assessment/Plan  PT - Assessment/Plan Comments on Treatment Session: Pt plans to D/C to home with spouse today.  Practiced going up/down one step forward with RW.  Pt given handout on TKR HEP and performed all supine TE's.   Spouse present during session and observed/educated.  Pt waiting on RW to be delivered to room before she can D/C. Follow Up Recommendations: Home health PT Equipment Recommended: Rolling walker with 5" wheels PT Goals  Acute Rehab PT Goals PT Goal Formulation: With patient Pt will go Supine/Side to Sit: with modified independence PT Goal: Supine/Side to Sit - Progress: Met Pt will go Sit to Supine/Side: with modified independence PT Goal: Sit to Supine/Side - Progress: Met Pt will go Sit to Stand: with supervision PT Goal: Sit to Stand - Progress: Met Pt will go Stand to Sit: with supervision PT Goal: Stand to Sit - Progress: Met Pt will Ambulate: >150 feet;with supervision;with least restrictive assistive device PT Goal: Ambulate - Progress: Met Pt will Go Up / Down Stairs: 1-2 stairs;with min assist;with least restrictive assistive device PT Goal: Up/Down Stairs - Progress: Met Pt will Perform Home Exercise Program: Independently PT Goal: Perform Home Exercise Program - Progress: Partly met  PT Treatment Precautions/Restrictions  Precautions Precautions: Knee Precaution Comments: Pt/spouse instructed to avoid kneeling on her TKR Required Braces or Orthoses: No Knee Immobilizer: Other (comment) (Pt able to perform 10 active SLR, instructed to D/C KI) Restrictions Weight Bearing Restrictions: No LLE Weight Bearing: Weight bearing as tolerated Mobility (including Balance) Bed Mobility Bed Mobility: Yes Sit to Supine: 6: Modified independent (Device/Increase time) Sit to Supine -  Details (indicate cue type and reason): increased time Transfers Transfers: Yes Sit to Stand: 6: Modified independent (Device/Increase time);From chair/3-in-1;From toilet Sit to Stand Details (indicate cue type and reason): good safety tech Stand to Sit: 6: Modified independent (Device/Increase time);To bed Stand to Sit Details: good safety tech Ambulation/Gait Ambulation/Gait: Yes Ambulation/Gait Assistance: 6: Modified independent (Device/Increase time) Ambulation/Gait Assistance Details (indicate cue type and reason): with out KI, one VC on safety with turns and backward gait Ambulation Distance (Feet): 125 Feet Assistive device: Rolling walker Gait Pattern: Step-to pattern;Trunk flexed;Decreased stance time - left Gait velocity: Pt c/o 2/10 knee pain with amb, ICE pack applied after session and rest Stairs: Yes Stairs Assistance: Other (comment) (MinGuard Assist) Stair Management Technique: No rails;Forwards;With walker Number of Stairs: 1  Wheelchair Mobility Wheelchair Mobility: No    Exercise  Total Joint Exercises Ankle Circles/Pumps: AROM;Both;10 reps;Supine Quad Sets: AROM;Both;10 reps;Supine Gluteal Sets: AROM;Both;10 reps;Supine Towel Squeeze: AROM;Both;10 reps;Supine Short Arc Quad: AROM;Left;10 reps;Supine Heel Slides: AAROM;Left;10 reps;Supine (Instructed pt how to use a bed sheet to assist with knee fle) Hip ABduction/ADduction: AROM;Left;10 reps Straight Leg Raises: AROM;Left;10 reps;Supine (instructed pt not to wear KI since she was able 10 active SL) End of Session PT - End of Session Equipment Utilized During Treatment: Gait belt Activity Tolerance: Patient tolerated treatment well Patient left: in bed;with call bell in reach;with family/visitor present Nurse Communication: Other (comment) (Pt ready for D/C instructions, waiting on RW to be delivered) General Behavior During Session: [facility] for tasks performed Cognition: [facility] for tasks performed  Felecia Shelling   PTA [facility]  Acute  Rehab Pager     [phone]

## 2011-04-06 NOTE — Progress Notes (Signed)
Subjective: 3 Days Post-Op Procedure(s) (LRB): TOTAL KNEE ARTHROPLASTY (Left) Patient reports pain as mild.   Patient is doing well this morning. Ready to go home.  Objective: Vital signs in last 24 hours: Temp:  [98.7 F (37.1 C)-100.1 F (37.8 C)] 98.7 F (37.1 C) (02/28 0555) Pulse Rate:  [94-104] 94  (02/28 0555) Resp:  [18] 18  (02/28 0555) BP: (117-143)/(72-85) 143/84 mmHg (02/28 0555) SpO2:  [94 %-100 %] 94 % (02/28 0555)  Intake/Output from previous day:  Intake/Output Summary (Last 24 hours) at 04/06/11 1037 Last data filed at 04/06/11 0758  Gross per 24 hour  Intake   1500 ml  Output   1500 ml  Net      0 ml    Intake/Output this shift: Total I/O In: 120 [P.O.:120] Out: 400 [Urine:400]  Labs: Results for orders placed during the hospital encounter of 04/03/11  TYPE AND SCREEN      Component Value Range   ABO/RH(D) B POS     Antibody Screen NEG     Sample Expiration 04/06/2011    CBC      Component Value Range   WBC 12.3 (*) 4.0 - 10.5 (K/uL)   RBC 3.70 (*) 3.87 - 5.11 (MIL/uL)   Hemoglobin 11.1 (*) 12.0 - 15.0 (g/dL)   HCT 91.4 (*) 78.2 - 46.0 (%)   MCV 87.8  78.0 - 100.0 (fL)   MCH 30.0  26.0 - 34.0 (pg)   MCHC 34.2  30.0 - 36.0 (g/dL)   RDW 95.6  21.3 - 08.6 (%)   Platelets 283  150 - 400 (K/uL)  BASIC METABOLIC PANEL      Component Value Range   Sodium 133 (*) 135 - 145 (mEq/L)   Potassium 5.0  3.5 - 5.1 (mEq/L)   Chloride 99  96 - 112 (mEq/L)   CO2 27  19 - 32 (mEq/L)   Glucose, Bld 147 (*) 70 - 99 (mg/dL)   BUN 10  6 - 23 (mg/dL)   Creatinine, Ser 5.78  0.50 - 1.10 (mg/dL)   Calcium 9.6  8.4 - 46.9 (mg/dL)   GFR calc non Af Amer >90  >90 (mL/min)   GFR calc Af Amer >90  >90 (mL/min)  CBC      Component Value Range   WBC 14.2 (*) 4.0 - 10.5 (K/uL)   RBC 3.34 (*) 3.87 - 5.11 (MIL/uL)   Hemoglobin 10.1 (*) 12.0 - 15.0 (g/dL)   HCT 62.9 (*) 52.8 - 46.0 (%)   MCV 89.2  78.0 - 100.0 (fL)   MCH 30.2  26.0 - 34.0 (pg)   MCHC 33.9  30.0 -  36.0 (g/dL)   RDW 41.3  24.4 - 01.0 (%)   Platelets 253  150 - 400 (K/uL)  BASIC METABOLIC PANEL      Component Value Range   Sodium 136  135 - 145 (mEq/L)   Potassium 4.6  3.5 - 5.1 (mEq/L)   Chloride 101  96 - 112 (mEq/L)   CO2 30  19 - 32 (mEq/L)   Glucose, Bld 142 (*) 70 - 99 (mg/dL)   BUN 14  6 - 23 (mg/dL)   Creatinine, Ser 2.72  0.50 - 1.10 (mg/dL)   Calcium 8.9  8.4 - 53.6 (mg/dL)   GFR calc non Af Amer 87 (*) >90 (mL/min)   GFR calc Af Amer >90  >90 (mL/min)  CBC      Component Value Range   WBC 10.7 (*) 4.0 - 10.5 (  K/uL)   RBC 3.02 (*) 3.87 - 5.11 (MIL/uL)   Hemoglobin 9.1 (*) 12.0 - 15.0 (g/dL)   HCT 69.6 (*) 29.5 - 46.0 (%)   MCV 88.4  78.0 - 100.0 (fL)   MCH 30.1  26.0 - 34.0 (pg)   MCHC 34.1  30.0 - 36.0 (g/dL)   RDW 28.4  13.2 - 44.0 (%)   Platelets 216  150 - 400 (K/uL)    Exam: Neurovascular intact Sensation intact distally Incision - clean, dry, no drainage Motor function intact - moving foot and toes well on exam.   Assessment/Plan: 3 Days Post-Op Procedure(s) (LRB): TOTAL KNEE ARTHROPLASTY (Left) Procedure(s) (LRB): TOTAL KNEE ARTHROPLASTY (Left) Past Medical History  Diagnosis Date  . MENOPAUSAL SYNDROME 09/17/2008  . OSTEOARTHRITIS 09/17/2008   Principal Problem:  *OA (osteoarthritis) of knee   Up with therapy Discharge home with home health Diet - regular Follow up - in 2 weeks Activity - WBAT Condition Upon Discharge - Good D/C Meds - See DC Summary DVT Prophylaxis - Xarelto Protocol   Robin Francis 04/06/2011, 10:37 AM

## 2011-04-13 DIAGNOSIS — D62 Acute posthemorrhagic anemia: Secondary | ICD-10-CM | POA: Diagnosis not present

## 2011-04-13 DIAGNOSIS — E871 Hypo-osmolality and hyponatremia: Secondary | ICD-10-CM | POA: Diagnosis not present

## 2011-04-13 NOTE — Discharge Summary (Signed)
Physician Discharge Summary   Patient ID: Robin Francis MRN: [id number] DOB/AGE: 05-29-1945 66 y.o.  Admit date: 04/03/2011 Discharge date: 04/06/2011    Primary Diagnosis: Osteoarthritis Left Knee   Admission Diagnoses: Past Medical History  Diagnosis Date  . MENOPAUSAL SYNDROME 09/17/2008  . OSTEOARTHRITIS 09/17/2008    Discharge Diagnoses:  Principal Problem:  *OA (osteoarthritis) of knee Active Problems:  Postop Hyponatremia  Postop Acute blood loss anemia   Procedure: Procedure(s) (LRB): TOTAL KNEE ARTHROPLASTY (Left)   Consults: None  HPI: Robin Francis is a 66 y.o. year old female with end stage OA of her left knee with progressively worsening pain and dysfunction. She has constant pain, with activity and at rest and significant functional deficits with difficulties even with ADLs. She has had extensive non-op management including analgesics, injections of cortisone and viscosupplements, and home exercise program, but remains in significant pain with significant dysfunction. Radiographs show bone on bone arthritis medial and patellofemoral. She presents now for left Total Knee Arthroplasty.       Laboratory Data: Hospital Outpatient Visit on 03/21/2011  Component Date Value Range Status  . aPTT (seconds) 03/21/2011 34  24-37 Final  . WBC (K/uL) 03/21/2011 9.3  4.0-10.5 Final  . RBC (MIL/uL) 03/21/2011 4.60  3.87-5.11 Final  . Hemoglobin (g/dL) 40/98/1191 47.8  29.5-62.1 Final  . HCT (%) 03/21/2011 40.3  36.0-46.0 Final  . MCV (fL) 03/21/2011 87.6  78.0-100.0 Final  . MCH (pg) 03/21/2011 30.0  26.0-34.0 Final  . MCHC (g/dL) 30/86/5784 69.6  29.5-28.4 Final  . RDW (%) 03/21/2011 12.8  11.5-15.5 Final  . Platelets (K/uL) 03/21/2011 296  150-400 Final  . Sodium (mEq/L) 03/21/2011 142  135-145 Final  . Potassium (mEq/L) 03/21/2011 4.9  3.5-5.1 Final  . Chloride (mEq/L) 03/21/2011 104  96-112 Final  . CO2 (mEq/L) 03/21/2011 29  19-32 Final  . Glucose, Bld  (mg/dL) 13/24/4010 90  27-25 Final  . BUN (mg/dL) 36/64/4034 23  7-42 Final  . Creatinine, Ser (mg/dL) 59/56/3875 6.43  3.29-5.18 Final  . Calcium (mg/dL) 84/16/6063 01.6  0.1-09.3 Final  . Total Protein (g/dL) 23/55/7322 7.4  0.2-5.4 Final  . Albumin (g/dL) 27/07/2374 3.9  2.8-3.1 Final  . AST (U/L) 03/21/2011 30  0-37 Final  . ALT (U/L) 03/21/2011 29  0-35 Final  . Alkaline Phosphatase (U/L) 03/21/2011 74  39-117 Final  . Total Bilirubin (mg/dL) 51/76/1607 0.6  3.7-1.0 Final  . GFR calc non Af Amer (mL/min) 03/21/2011 67* >90 Final  . GFR calc Af Amer (mL/min) 03/21/2011 77* >90 Final   Comment:                                 The eGFR has been calculated                          using the CKD EPI equation.                          This calculation has not been                          validated in all clinical                          situations.  eGFR's persistently                          <90 mL/min signify                          possible Chronic Kidney Disease.  Marland Kitchen Prothrombin Time (seconds) 03/21/2011 13.0  11.6-15.2 Final  . INR  03/21/2011 0.96  0.00-1.49 Final  . Color, Urine  03/21/2011 YELLOW  YELLOW Final  . APPearance  03/21/2011 CLEAR  CLEAR Final  . Specific Gravity, Urine  03/21/2011 1.016  1.005-1.030 Final  . pH  03/21/2011 6.5  5.0-8.0 Final  . Glucose, UA (mg/dL) 60/45/4098 NEGATIVE  NEGATIVE Final  . Hgb urine dipstick  03/21/2011 NEGATIVE  NEGATIVE Final  . Bilirubin Urine  03/21/2011 NEGATIVE  NEGATIVE Final  . Ketones, ur (mg/dL) 11/91/4782 NEGATIVE  NEGATIVE Final  . Protein, ur (mg/dL) 95/62/1308 NEGATIVE  NEGATIVE Final  . Urobilinogen, UA (mg/dL) 65/78/4696 0.2  2.9-5.2 Final  . Nitrite  03/21/2011 NEGATIVE  NEGATIVE Final  . Leukocytes, UA  03/21/2011 NEGATIVE  NEGATIVE Final   MICROSCOPIC NOT DONE ON URINES WITH NEGATIVE PROTEIN, BLOOD, LEUKOCYTES, NITRITE, OR GLUCOSE <1000 mg/dL.  Marland Kitchen MRSA, PCR  03/21/2011 NEGATIVE  NEGATIVE  Final  . Staphylococcus aureus  03/21/2011 NEGATIVE  NEGATIVE Final   Comment:                                 The Xpert SA Assay (FDA                          approved for NASAL specimens                          only), is one component of                          a comprehensive surveillance                          program.  It is not intended                          to diagnose infection nor to                          guide or monitor treatment.   No results found for this basename: HGB:5 in the last 72 hours No results found for this basename: WBC:2,RBC:2,HCT:2,PLT:2 in the last 72 hours No results found for this basename: NA:2,K:2,CL:2,CO2:2,BUN:2,CREATININE:2,GLUCOSE:2,CALCIUM:2 in the last 72 hours No results found for this basename: LABPT:2,INR:2 in the last 72 hours  X-Rays: Chest 2 View  03/21/2011  *RADIOLOGY REPORT*  Clinical Data: Preoperative respiratory films.  CHEST - 2 VIEW  Comparison: None.  Findings: Lung volumes are low but the lungs are clear.  Heart size is normal.  No pneumothorax or pleural effusion.  No focal bony abnormality.  Partial visualization of lumbar fusion hardware noted.  There is some scattered degenerative disease in the thoracic spine.  IMPRESSION: No acute finding.  Original Report Authenticated By: Bernadene Bell. D'ALESSIO, M.D.    EKG: Orders placed during the hospital encounter of 03/21/11  .  EKG 12-LEAD  . EKG 12-LEAD     Hospital Course: Patient was admitted to [facility] and taken to the OR and underwent the above state procedure without complications.  Patient tolerated the procedure well and was later transferred to the recovery room and then to the orthopaedic floor for postoperative care.  They were given PO and IV analgesics for pain control following their surgery.  They were given 24 hours of postoperative antibiotics and started on DVT prophylaxis.   PT and OT were ordered for total joint protocol.  Discharge planning consulted  to help with postop disposition and equipment needs.  Patient had a decent night on the evening of surgery and started to get up with therapy on day one.  PCA was discontinued and they were weaned over to PO meds.  Hemovac drain was pulled without difficulty.  Continued to progress with therapy into day two.  Dressing was changed on day two and the incision was healing well.  By day three, the patient had progressed with therapy and meeting goals.  Incision was healing well.  Patient was seen in rounds and was ready to go home.   Discharge Medications: Prior to Admission medications   Medication Sig Start Date End Date Taking? Authorizing Provider  cetirizine (ZYRTEC) 10 MG tablet Take 10 mg by mouth daily.   Yes Historical Provider, MD  Polyethyl Glycol-Propyl Glycol (SYSTANE) 0.4-0.3 % SOLN Apply 1 drop to eye at bedtime.   Yes Historical Provider, MD  iron polysaccharides (NIFEREX) 150 MG capsule Take 1 capsule (150 mg total) by mouth daily. 04/06/11 04/05/12  Ysabelle Goodroe Julien Girt, PA  methocarbamol (ROBAXIN) 500 MG tablet Take 1 tablet (500 mg total) by mouth every 6 (six) hours as needed. 04/06/11 04/16/11  Juliocesar Blasius, PA  oxyCODONE (OXY IR/ROXICODONE) 5 MG immediate release tablet Take 1-2 tablets (5-10 mg total) by mouth every 4 (four) hours as needed for pain. 04/06/11 04/16/11  Jemiah Cuadra Julien Girt, PA  rivaroxaban (XARELTO) 10 MG TABS tablet Take 1 tablet (10 mg total) by mouth daily with breakfast. 04/06/11   Corinda Ammon Julien Girt, PA    Diet: regular Activity:WBAT Follow-up:in 2 weeks Disposition: home Discharged Condition: good   Discharge Orders    Future Orders Please Complete By Expires   Diet - low sodium heart healthy      Call MD / Call 911      Comments:   If you experience chest pain or shortness of breath, CALL 911 and be transported to the hospital emergency room.  If you develope a fever above 101 F, pus (white drainage) or increased drainage or redness at the wound,  or calf pain, call your surgeon's office.   Constipation Prevention      Comments:   Drink plenty of fluids.  Prune juice may be helpful.  You may use a stool softener, such as Colace (over the counter) 100 mg twice a day.  Use MiraLax (over the counter) for constipation as needed.   Increase activity slowly as tolerated      Weight Bearing as taught in Physical Therapy      Comments:   Use a walker or crutches as instructed.   Discharge instructions      Comments:   Pick up stool softner and laxative for home. Do not submerge incision under water. May shower. Continue to use ice for pain and swelling from surgery.    Driving restrictions      Comments:   No driving   Lifting  restrictions      Comments:   No lifting   TED hose      Comments:   Use stockings (TED hose) for 3 weeks on both leg(s).  You may remove them at night for sleeping.   Change dressing      Comments:   Change dressing daily with sterile 4 x 4 inch gauze dressing and apply TED hose.   Do not put a pillow under the knee. Place it under the heel.        Medication List  As of 04/13/2011 11:05 AM   STOP taking these medications         b complex vitamins tablet      Calcium Carb-Cholecalciferol 500-400 MG-UNIT Chew      Calcium Carb-Cholecalciferol [phone] MG-UNIT Caps      cholecalciferol 1000 UNITS tablet      CINNAMON PO      CO Q 10 PO      COQ-10 & FISH OIL 120-180-50-30 Caps      meloxicam 15 MG tablet      multivitamin tablet      RED YEAST RICE EXTRACT PO      TRIPLE FLEX BONE & JOINT 750-600-100 MG Tabs      vitamin C 500 MG tablet      vitamin E 400 UNIT capsule         TAKE these medications         cetirizine 10 MG tablet   Commonly known as: ZYRTEC   Take 10 mg by mouth daily.      iron polysaccharides 150 MG capsule   Commonly known as: NIFEREX   Take 1 capsule (150 mg total) by mouth daily.      methocarbamol 500 MG tablet   Commonly known as: ROBAXIN   Take 1  tablet (500 mg total) by mouth every 6 (six) hours as needed.      oxyCODONE 5 MG immediate release tablet   Commonly known as: Oxy IR/ROXICODONE   Take 1-2 tablets (5-10 mg total) by mouth every 4 (four) hours as needed for pain.      rivaroxaban 10 MG Tabs tablet   Commonly known as: XARELTO   Take 1 tablet (10 mg total) by mouth daily with breakfast.      SYSTANE 0.4-0.3 % Soln   Generic drug: Polyethyl Glycol-Propyl Glycol   Apply 1 drop to eye at bedtime.           Follow-up Information    Follow up with Loanne Drilling, MD. Schedule an appointment as soon as possible for a visit in 2 weeks. (Follow up on 3/12 or 3/14)    Contact information:   [facility] [address] [place] [address] [phone]          Signed: Patrica Duel 04/13/2011, 11:05 AM

## 2011-07-05 ENCOUNTER — Encounter: Payer: Self-pay | Admitting: Obstetrics and Gynecology

## 2011-09-06 ENCOUNTER — Encounter: Payer: Self-pay | Admitting: Gynecology

## 2011-09-06 DIAGNOSIS — M8580 Other specified disorders of bone density and structure, unspecified site: Secondary | ICD-10-CM | POA: Insufficient documentation

## 2011-09-14 ENCOUNTER — Ambulatory Visit (INDEPENDENT_AMBULATORY_CARE_PROVIDER_SITE_OTHER): Payer: 59 | Admitting: Obstetrics and Gynecology

## 2011-09-14 ENCOUNTER — Encounter: Payer: Self-pay | Admitting: Obstetrics and Gynecology

## 2011-09-14 VITALS — BP 124/78 | Ht <= 58 in | Wt 153.0 lb

## 2011-09-14 DIAGNOSIS — Z01419 Encounter for gynecological examination (general) (routine) without abnormal findings: Secondary | ICD-10-CM

## 2011-09-14 NOTE — Patient Instructions (Signed)
Continue yearly mammograms 

## 2011-09-14 NOTE — Progress Notes (Signed)
Patient came to see me today for her annual GYN exam. She is doing well with menopausal symptoms with just a few and no need for HRT. She is up-to-date on mammograms. Her bone density 2 years ago showed minimal osteopenia without an elevated fracture risk. She does her lab through her PCP. She had a left knee replacement since I last saw her. She is not having vaginal bleeding or pelvic pain. Greater than 30 years ago she had cervical dysplasia treated with cryosurgery. She has had normal Paps since that.  Physical examination:Robin Francis present. HEENT within normal limits. Neck: Thyroid not large. No masses. Supraclavicular nodes: not enlarged. Breasts: Examined in both sitting and lying  position. No skin changes and no masses. Abdomen: Soft no guarding rebound or masses or hernia. Pelvic: External: Within normal limits. BUS: Within normal limits. Vaginal:within normal limits. Good estrogen effect. No evidence of cystocele rectocele or enterocele. Cervix: clean. Uterus: Normal size and shape. Adnexa: No masses. Rectovaginal exam: Confirmatory and negative. Extremities: Within normal limits.  Assessment: Normal GYN exam. Mild osteopenia. Previous cervical dysplasia.  Plan: Continue yearly mammograms. Bone density in 2014.The new Pap smear guidelines were discussed with the patient. No pap done.

## 2011-09-19 ENCOUNTER — Encounter: Payer: Self-pay | Admitting: Obstetrics and Gynecology

## 2011-09-20 ENCOUNTER — Other Ambulatory Visit (INDEPENDENT_AMBULATORY_CARE_PROVIDER_SITE_OTHER): Payer: 59

## 2011-09-20 DIAGNOSIS — Z0000 Encounter for general adult medical examination without abnormal findings: Secondary | ICD-10-CM

## 2011-09-20 LAB — POCT URINALYSIS DIPSTICK
Bilirubin, UA: NEGATIVE
Ketones, UA: NEGATIVE
Protein, UA: NEGATIVE
Spec Grav, UA: 1.015

## 2011-09-20 LAB — CBC WITH DIFFERENTIAL/PLATELET
Basophils Relative: 0.9 % (ref 0.0–3.0)
Eosinophils Absolute: 0.2 10*3/uL (ref 0.0–0.7)
Eosinophils Relative: 2.9 % (ref 0.0–5.0)
HCT: 39.6 % (ref 36.0–46.0)
Hemoglobin: 13.1 g/dL (ref 12.0–15.0)
Lymphs Abs: 2.5 10*3/uL (ref 0.7–4.0)
MCHC: 33 g/dL (ref 30.0–36.0)
MCV: 90.3 fl (ref 78.0–100.0)
Monocytes Absolute: 0.6 10*3/uL (ref 0.1–1.0)
Neutro Abs: 3.7 10*3/uL (ref 1.4–7.7)
Neutrophils Relative %: 52.7 % (ref 43.0–77.0)
RBC: 4.39 Mil/uL (ref 3.87–5.11)
WBC: 7 10*3/uL (ref 4.5–10.5)

## 2011-09-20 LAB — HEPATIC FUNCTION PANEL
ALT: 29 U/L (ref 0–35)
Albumin: 4.1 g/dL (ref 3.5–5.2)
Bilirubin, Direct: 0.1 mg/dL (ref 0.0–0.3)
Total Protein: 7 g/dL (ref 6.0–8.3)

## 2011-09-20 LAB — BASIC METABOLIC PANEL
CO2: 28 mEq/L (ref 19–32)
Chloride: 104 mEq/L (ref 96–112)
Creatinine, Ser: 0.7 mg/dL (ref 0.4–1.2)
Potassium: 4.6 mEq/L (ref 3.5–5.1)

## 2011-09-20 LAB — LIPID PANEL
Cholesterol: 230 mg/dL — ABNORMAL HIGH (ref 0–200)
Total CHOL/HDL Ratio: 3
Triglycerides: 56 mg/dL (ref 0.0–149.0)

## 2011-09-20 LAB — LDL CHOLESTEROL, DIRECT: Direct LDL: 127.6 mg/dL

## 2011-09-20 LAB — TSH: TSH: 3.4 u[IU]/mL (ref 0.35–5.50)

## 2011-10-02 ENCOUNTER — Ambulatory Visit (INDEPENDENT_AMBULATORY_CARE_PROVIDER_SITE_OTHER): Payer: 59 | Admitting: Internal Medicine

## 2011-10-02 ENCOUNTER — Encounter: Payer: Self-pay | Admitting: Internal Medicine

## 2011-10-02 VITALS — BP 140/88 | HR 76 | Temp 97.7°F | Resp 18 | Ht <= 58 in | Wt 156.0 lb

## 2011-10-02 DIAGNOSIS — Z0000 Encounter for general adult medical examination without abnormal findings: Secondary | ICD-10-CM

## 2011-10-02 DIAGNOSIS — IMO0002 Reserved for concepts with insufficient information to code with codable children: Secondary | ICD-10-CM

## 2011-10-02 DIAGNOSIS — M179 Osteoarthritis of knee, unspecified: Secondary | ICD-10-CM

## 2011-10-02 DIAGNOSIS — M1710 Unilateral primary osteoarthritis, unspecified knee: Secondary | ICD-10-CM

## 2011-10-03 ENCOUNTER — Encounter: Payer: Self-pay | Admitting: Internal Medicine

## 2011-10-03 NOTE — Progress Notes (Signed)
  Subjective:    Patient ID: Robin Francis, female    DOB: Dec 25, 1945, 66 y.o.   MRN: [id number]  HPI    Review of Systems     Objective:   Physical Exam        Assessment & Plan:

## 2011-10-03 NOTE — Patient Instructions (Signed)
Take a calcium supplement, plus 800-1200 units of vitamin D    It is important that you exercise regularly, at least 20 minutes 3 to 4 times per week.  If you develop chest pain or shortness of breath seek  medical attention.  Limit your sodium (Salt) intake  Return in one year for follow-up   

## 2011-10-03 NOTE — Progress Notes (Signed)
Patient ID: FATHIMA BARTL, female   DOB: [birth year], 66 y.o.   MRN: [id number]  Subjective:    Patient ID: SHEVONNE WOLF, female    DOB: 1945/11/07, 66 y.o.   MRN: [id number]  HPI  66 year-old patient who is seen today for a health maintenance examination. She enjoys excellent health. She is followed by orthopedics due to osteoarthritis. She is followed by gynecology and has had a colonoscopy in 2008. No new concerns or complaints today laboratory studies were reviewed and these were unremarkable  Past medical history reviewed from the chart unremarkable except for orthopedic procedures Medications include meloxicam and a number of herbal products and vitamins  Family history reviewed father died of suicide death mother died young from pancreatic cancer one brother remains well  Past Medical History  Diagnosis Date  . MENOPAUSAL SYNDROME 09/17/2008  . OSTEOARTHRITIS 09/17/2008  . Low bone mass     History   Social History  . Marital Status: Married    Spouse Name: N/A    Number of Children: N/A  . Years of Education: N/A   Occupational History  . Not on file.   Social History Main Topics  . Smoking status: Never Smoker   . Smokeless tobacco: Never Used  . Alcohol Use: 2.0 oz/week    4 drink(s) per week     occassionally-social  . Drug Use: No  . Sexually Active: Yes    Birth Control/ Protection: Post-menopausal   Other Topics Concern  . Not on file   Social History Narrative  . No narrative on file    Past Surgical History  Procedure Date  . Knee arthroscopy     left  . Back surgery   . Trigger finger release   . Tonsillectomy     as child  . 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 Pancreatic cancer Mother   . Breast cancer Maternal Grandmother     Age 58    No Known  Allergies  Current Outpatient Prescriptions on File Prior to Visit  Medication Sig Dispense Refill  . Ascorbic Acid (VITAMIN C PO) Take by mouth.      . Calcium Carbonate-Vitamin D (CALCIUM + D PO) Take by mouth.      . cetirizine (ZYRTEC) 10 MG tablet Take 10 mg by mouth daily.      . Cholecalciferol (VITAMIN D PO) Take by mouth.      . fish oil-omega-3 fatty acids 1000 MG capsule Take 1 g by mouth daily.      . iron polysaccharides (NIFEREX) 150 MG capsule Take 1 capsule (150 mg total) by mouth daily.  21 capsule  0  . meloxicam (MOBIC) 15 MG tablet Take 15 mg by mouth daily.      . Multiple Vitamin (MULTIVITAMIN) tablet Take 1 tablet by mouth daily.      Bertram Gala Glycol-Propyl Glycol (SYSTANE) 0.4-0.3 % SOLN Apply 1 drop to eye at bedtime.      . rivaroxaban (XARELTO) 10 MG TABS tablet Take 1 tablet (10 mg total) by mouth daily with breakfast.  18 tablet  0  . VITAMIN E PO Take by mouth.        BP 140/88  Pulse 76  Temp 97.7 F (36.5 C) (Oral)  Resp 18  Ht 4'  9" (1.448 m)  Wt 156 lb (70.761 kg)  BMI 33.76 kg/m2  SpO2 95%    Review of Systems  Constitutional: Negative for fever, appetite change, fatigue and unexpected weight change.  HENT: Negative for hearing loss, ear pain, nosebleeds, congestion, sore throat, mouth sores, trouble swallowing, neck stiffness, dental problem, voice change, sinus pressure and tinnitus.   Eyes: Negative for photophobia, pain, redness and visual disturbance.  Respiratory: Negative for cough, chest tightness and shortness of breath.   Cardiovascular: Negative for chest pain, palpitations and leg swelling.  Gastrointestinal: Negative for nausea, vomiting, abdominal pain, diarrhea, constipation, blood in stool, abdominal distention and rectal pain.  Genitourinary: Negative for dysuria, urgency, frequency, hematuria, flank pain, vaginal bleeding, vaginal discharge, difficulty urinating, genital sores, vaginal pain, menstrual problem and pelvic pain.   Musculoskeletal: Negative for back pain and arthralgias.  Skin: Negative for rash.  Neurological: Negative for dizziness, syncope, speech difficulty, weakness, light-headedness, numbness and headaches.  Hematological: Negative for adenopathy. Does not bruise/bleed easily.  Psychiatric/Behavioral: Negative for suicidal ideas, behavioral problems, self-injury, dysphoric mood and agitation. The patient is not nervous/anxious.        Objective:   Physical Exam  Constitutional: She is oriented to person, place, and time. She appears well-developed and well-nourished.  HENT:  Head: Normocephalic and atraumatic.  Right Ear: External ear normal.  Left Ear: External ear normal.  Mouth/Throat: Oropharynx is clear and moist.  Eyes: Conjunctivae and EOM are normal.  Neck: Normal range of motion. Neck supple. No JVD present. No thyromegaly present.  Cardiovascular: Normal rate, regular rhythm, normal heart sounds and intact distal pulses.   No murmur heard. Pulmonary/Chest: Effort normal and breath sounds normal. She has no wheezes. She has no rales.  Abdominal: Soft. Bowel sounds are normal. She exhibits no distension and no mass. There is no tenderness. There is no rebound and no guarding.  Musculoskeletal: Normal range of motion. She exhibits no edema and no tenderness.  Neurological: She is alert and oriented to person, place, and time. She has normal reflexes. No cranial nerve deficit. She exhibits normal muscle tone. Coordination normal.  Skin: Skin is warm and dry. No rash noted.  Psychiatric: She has a normal mood and affect. Her behavior is normal.          Assessment & Plan:   Preventive health examination  We'll continue calcium and vitamin D supplements. We'll continue regular exercise. Efforts at  weight loss encouraged return in one year for followup.

## 2011-10-25 ENCOUNTER — Other Ambulatory Visit: Payer: Self-pay | Admitting: Internal Medicine

## 2012-04-04 ENCOUNTER — Ambulatory Visit (INDEPENDENT_AMBULATORY_CARE_PROVIDER_SITE_OTHER): Payer: 59 | Admitting: Internal Medicine

## 2012-04-04 ENCOUNTER — Encounter: Payer: Self-pay | Admitting: Internal Medicine

## 2012-04-04 VITALS — BP 160/100 | HR 82 | Temp 97.5°F | Resp 18 | Wt 155.0 lb

## 2012-04-04 DIAGNOSIS — I10 Essential (primary) hypertension: Secondary | ICD-10-CM

## 2012-04-04 DIAGNOSIS — M1990 Unspecified osteoarthritis, unspecified site: Secondary | ICD-10-CM

## 2012-04-04 MED ORDER — LISINOPRIL-HYDROCHLOROTHIAZIDE 20-25 MG PO TABS: 1.0000 | ORAL_TABLET | Freq: Every day | ORAL | Status: DC

## 2012-04-04 NOTE — Progress Notes (Signed)
Subjective:    Patient ID: Robin Francis, female    DOB: [birth year], 67 y.o.   MRN: [id number]  HPI  67 year old patient who is seen today with blood pressure concerns. Blood pressure has been consistently high with home blood pressure monitoring and also entered dentist office and orthopedics office. Blood pressure has been consistently in a stage II range with systolics greater than 160 and diastolics greater 100. She generally feels well prepped have some occasional mild headaches she does take anti-inflammatories for arthritis. She does exercise regularly  BP Readings from Last 3 Encounters:  04/04/12 160/100  10/02/11 140/88  09/14/11 124/78   \ Past Medical History  Diagnosis Date  . MENOPAUSAL SYNDROME 09/17/2008  . OSTEOARTHRITIS 09/17/2008  . Low bone mass     History   Social History  . Marital Status: Married    Spouse Name: N/A    Number of Children: N/A  . Years of Education: N/A   Occupational History  . Not on file.   Social History Main Topics  . Smoking status: Never Smoker   . Smokeless tobacco: Never Used  . Alcohol Use: 2.0 oz/week    4 drink(s) per week     Comment: occassionally-social  . Drug Use: No  . Sexually Active: Yes    Birth Control/ Protection: Post-menopausal   Other Topics Concern  . Not on file   Social History Narrative  . No narrative on file    Past Surgical History  Procedure Laterality Date  . Knee arthroscopy      left  . Back surgery    . Trigger finger release    . Tonsillectomy      as child  . Total knee arthroplasty  04/03/2011    Procedure: TOTAL KNEE ARTHROPLASTY;  Surgeon: Loanne Drilling, MD;  Location: [facility];  Service: Orthopedics;  Laterality: Left;    Family History  Problem Relation Age of Onset  . Pancreatic cancer Mother   . Breast cancer Maternal Grandmother     Age 60    No Known Allergies  Current Outpatient Prescriptions on File Prior to Visit  Medication Sig Dispense Refill  . Ascorbic  Acid (VITAMIN C PO) Take by mouth.      . Calcium Carbonate-Vitamin D (CALCIUM + D PO) Take by mouth.      . cetirizine (ZYRTEC) 10 MG tablet Take 10 mg by mouth daily.      . Cholecalciferol (VITAMIN D PO) Take by mouth.      . fish oil-omega-3 fatty acids 1000 MG capsule Take 1 g by mouth daily.      . meloxicam (MOBIC) 15 MG tablet TAKE 1 TABLET BY MOUTH EVERY DAY  90 tablet  3  . Multiple Vitamin (MULTIVITAMIN) tablet Take 1 tablet by mouth daily.      Marland Kitchen VITAMIN E PO Take by mouth.       No current facility-administered medications on file prior to visit.    BP 160/100  Pulse 82  Temp(Src) 97.5 F (36.4 C) (Oral)  Resp 18  Wt 155 lb (70.308 kg)  BMI 33.53 kg/m2  SpO2 98%     Review of Systems  Constitutional: Negative.   HENT: Negative for hearing loss, congestion, sore throat, rhinorrhea, dental problem, sinus pressure and tinnitus.   Eyes: Negative for pain, discharge and visual disturbance.  Respiratory: Negative for cough and shortness of breath.   Cardiovascular: Negative for chest pain, palpitations and leg swelling.  Gastrointestinal: Negative  for nausea, vomiting, abdominal pain, diarrhea, constipation, blood in stool and abdominal distention.  Genitourinary: Negative for dysuria, urgency, frequency, hematuria, flank pain, vaginal bleeding, vaginal discharge, difficulty urinating, vaginal pain and pelvic pain.  Musculoskeletal: Negative for joint swelling, arthralgias and gait problem.  Skin: Negative for rash.  Neurological: Negative for dizziness, syncope, speech difficulty, weakness, numbness and headaches.  Hematological: Negative for adenopathy.  Psychiatric/Behavioral: Negative for behavioral problems, dysphoric mood and agitation. The patient is not nervous/anxious.        Objective:   Physical Exam  Constitutional: She is oriented to person, place, and time. She appears well-developed and well-nourished.  HENT:  Head: Normocephalic.  Right Ear:  External ear normal.  Left Ear: External ear normal.  Mouth/Throat: Oropharynx is clear and moist.  Eyes: Conjunctivae and EOM are normal. Pupils are equal, round, and reactive to light.  Neck: Normal range of motion. Neck supple. No thyromegaly present.  Cardiovascular: Normal rate, regular rhythm, normal heart sounds and intact distal pulses.   Pulmonary/Chest: Effort normal and breath sounds normal.  Abdominal: Soft. Bowel sounds are normal. She exhibits no mass. There is no tenderness.  Musculoskeletal: Normal range of motion.  Lymphadenopathy:    She has no cervical adenopathy.  Neurological: She is alert and oriented to person, place, and time.  Skin: Skin is warm and dry. No rash noted.  Psychiatric: She has a normal mood and affect. Her behavior is normal.          Assessment & Plan:   Hypertension. Patient has had a consistently elevated readings in the stage II range. Will place on lisinopril hydrochlorothiazide. Home blood pressure monitoring encouraged. We'll recheck in 4 weeks.

## 2012-04-04 NOTE — Patient Instructions (Addendum)
Limit your sodium (Salt) intake    It is important that you exercise regularly, at least 20 minutes 3 to 4 times per week.  If you develop chest pain or shortness of breath seek  medical attention.  Please check your blood pressure on a regular basis.  If it is consistently greater than 150/90, please make an office appointment.  DASH Diet The DASH diet stands for "Dietary Approaches to Stop Hypertension." It is a healthy eating plan that has been shown to reduce high blood pressure (hypertension) in as little as 14 days, while also possibly providing other significant health benefits. These other health benefits include reducing the risk of breast cancer after menopause and reducing the risk of type 2 diabetes, heart disease, colon cancer, and stroke. Health benefits also include weight loss and slowing kidney failure in patients with chronic kidney disease.  DIET GUIDELINES  Limit salt (sodium). Your diet should contain less than 1500 mg of sodium daily.  Limit refined or processed carbohydrates. Your diet should include mostly whole grains. Desserts and added sugars should be used sparingly.  Include small amounts of heart-healthy fats. These types of fats include nuts, oils, and tub margarine. Limit saturated and trans fats. These fats have been shown to be harmful in the body. CHOOSING FOODS  The following food groups are based on a 2000 calorie diet. See your Registered Dietitian for individual calorie needs. Grains and Grain Products (6 to 8 servings daily)  Eat More Often: Whole-wheat bread, brown rice, whole-grain or wheat pasta, quinoa, popcorn without added fat or salt (air popped).  Eat Less Often: White bread, white pasta, white rice, cornbread. Vegetables (4 to 5 servings daily)  Eat More Often: Fresh, frozen, and canned vegetables. Vegetables may be raw, steamed, roasted, or grilled with a minimal amount of fat.  Eat Less Often/Avoid: Creamed or fried vegetables. Vegetables  in a cheese sauce. Fruit (4 to 5 servings daily)  Eat More Often: All fresh, canned (in natural juice), or frozen fruits. Dried fruits without added sugar. One hundred percent fruit juice ( cup [237 mL] daily).  Eat Less Often: Dried fruits with added sugar. Canned fruit in light or heavy syrup. Lean Meats, Fish, and Poultry (2 servings or less daily. One serving is 3 to 4 oz [85-114 g]).  Eat More Often: Ninety percent or leaner ground beef, tenderloin, sirloin. Round cuts of beef, chicken breast, turkey breast. All fish. Grill, bake, or broil your meat. Nothing should be fried.  Eat Less Often/Avoid: Fatty cuts of meat, turkey, or chicken leg, thigh, or wing. Fried cuts of meat or fish. Dairy (2 to 3 servings)  Eat More Often: Low-fat or fat-free milk, low-fat plain or light yogurt, reduced-fat or part-skim cheese.  Eat Less Often/Avoid: Milk (whole, 2%).Whole milk yogurt. Full-fat cheeses. Nuts, Seeds, and Legumes (4 to 5 servings per week)  Eat More Often: All without added salt.  Eat Less Often/Avoid: Salted nuts and seeds, canned beans with added salt. Fats and Sweets (limited)  Eat More Often: Vegetable oils, tub margarines without trans fats, sugar-free gelatin. Mayonnaise and salad dressings.  Eat Less Often/Avoid: Coconut oils, palm oils, butter, stick margarine, cream, half and half, cookies, candy, pie. FOR MORE INFORMATION The Dash Diet Eating Plan: www.dashdiet.org Document Released: 01/12/2011 Document Revised: 04/17/2011 Document Reviewed: 01/12/2011 [employer] Patient Information 2013 [employer].  

## 2012-04-15 ENCOUNTER — Telehealth: Payer: Self-pay | Admitting: Internal Medicine

## 2012-04-15 MED ORDER — LOSARTAN POTASSIUM-HCTZ 100-25 MG PO TABS: 1.0000 | ORAL_TABLET | Freq: Every day | ORAL | Status: DC

## 2012-04-15 NOTE — Telephone Encounter (Signed)
Patient Information:  Caller Name: Robin Francis  Phone: [phone]  Patient: Robin, Francis  Gender: Female  DOB: 15-Nov-1945  Age: 67 Years  PCP: Eleonore Chiquito [facility])  Office Follow Up:  Does the office need to follow up with this patient?: Yes  Instructions For The Office: Lisinopril started 04/04/12 is making her cough, this is one of the side effects and it is "driving me crazy".  If medication needs to be stopped, or changed please call pt to advise.  TY!   Symptoms  Reason For Call & Symptoms: Pt saw Dr. Kirtland Bouchard last week, 04/04/12 and he started her on Lisinopril  20-25 1 PO QD and it is giving her a dry cough. and "it;s driving me crazy".  Cough started right after she started taking the medication.  Reviewed Health History In EMR: Yes  Reviewed Medications In EMR: Yes  Reviewed Allergies In EMR: N/A  Reviewed Surgeries / Procedures: N/A  Date of Onset of Symptoms: Unknown  Guideline(s) Used:  No Protocol Available - Information Only  Disposition Per Guideline:   Home Care  Reason For Disposition Reached:   Information only question and nurse able to answer  Advice Given:  N/A

## 2012-04-15 NOTE — Telephone Encounter (Signed)
Spoke to pt told her to stop the Lisinopril/HCTZ and new Rx for Hyzaar 100/25 mg one tablet by mouth daily was sent to pharmacy and need to follow up in 6 weeks. Pt verbalized understanding.

## 2012-04-15 NOTE — Telephone Encounter (Signed)
Discontinue lisinopril  Start generic Hyzaar 100/25  #60 one daily  recheck 6 weeks

## 2012-05-30 ENCOUNTER — Ambulatory Visit (INDEPENDENT_AMBULATORY_CARE_PROVIDER_SITE_OTHER): Payer: Medicare Other | Admitting: Internal Medicine

## 2012-05-30 ENCOUNTER — Encounter: Payer: Self-pay | Admitting: Internal Medicine

## 2012-05-30 VITALS — BP 130/80 | HR 61 | Temp 98.6°F | Resp 18 | Wt 153.0 lb

## 2012-05-30 DIAGNOSIS — I10 Essential (primary) hypertension: Secondary | ICD-10-CM

## 2012-05-30 MED ORDER — LOSARTAN POTASSIUM-HCTZ 100-25 MG PO TABS: 1.0000 | ORAL_TABLET | Freq: Every day | ORAL | Status: DC

## 2012-05-30 NOTE — Progress Notes (Signed)
Subjective:    Patient ID: Robin Francis, female    DOB: [birth year], 67 y.o.   MRN: [id number]  HPI  67 year old patient who is seen today for followup of hypertension. She was initially placed 6 weeks ago on a lisinopril combination for stage II hypertension. At approximately one week she developed a cough and was switched to Hyzaar which she has tolerated well. Home blood pressure readings have been nicely controlled. No concerns or complaints today  Past Medical History  Diagnosis Date  . MENOPAUSAL SYNDROME 09/17/2008  . OSTEOARTHRITIS 09/17/2008  . Low bone mass     History   Social History  . Marital Status: Married    Spouse Name: N/A    Number of Children: N/A  . Years of Education: N/A   Occupational History  . Not on file.   Social History Main Topics  . Smoking status: Never Smoker   . Smokeless tobacco: Never Used  . Alcohol Use: 2.0 oz/week    4 drink(s) per week     Comment: occassionally-social  . Drug Use: No  . Sexually Active: Yes    Birth Control/ Protection: Post-menopausal   Other Topics Concern  . Not on file   Social History Narrative  . No narrative on file    Past Surgical History  Procedure Laterality Date  . Knee arthroscopy      left  . Back surgery    . Trigger finger release    . Tonsillectomy      as child  . Total knee arthroplasty  04/03/2011    Procedure: TOTAL KNEE ARTHROPLASTY;  Surgeon: Loanne Drilling, MD;  Location: [facility];  Service: Orthopedics;  Laterality: Left;    Family History  Problem Relation Age of Onset  . Pancreatic cancer Mother   . Breast cancer Maternal Grandmother     Age 76    No Known Allergies  Current Outpatient Prescriptions on File Prior to Visit  Medication Sig Dispense Refill  . Ascorbic Acid (VITAMIN C PO) Take by mouth.      . Calcium Carbonate-Vitamin D (CALCIUM + D PO) Take by mouth.      . cetirizine (ZYRTEC) 10 MG tablet Take 10 mg by mouth daily.      . Cholecalciferol (VITAMIN D PO)  Take by mouth.      . fish oil-omega-3 fatty acids 1000 MG capsule Take 1 g by mouth daily.      . meloxicam (MOBIC) 15 MG tablet TAKE 1 TABLET BY MOUTH EVERY DAY  90 tablet  3  . Multiple Vitamin (MULTIVITAMIN) tablet Take 1 tablet by mouth daily.      . naproxen sodium (ANAPROX) 550 MG tablet       . VITAMIN E PO Take by mouth.       No current facility-administered medications on file prior to visit.    BP 130/80  Pulse 61  Temp(Src) 98.6 F (37 C) (Oral)  Resp 18  Wt 153 lb (69.4 kg)  BMI 33.1 kg/m2  SpO2 96%   Right ear    Review of Systems  Constitutional: Negative.   HENT: Negative for hearing loss, congestion, sore throat, rhinorrhea, dental problem, sinus pressure and tinnitus.   Eyes: Negative for pain, discharge and visual disturbance.  Respiratory: Positive for cough. Negative for shortness of breath.   Cardiovascular: Negative for chest pain, palpitations and leg swelling.  Gastrointestinal: Negative for nausea, vomiting, abdominal pain, diarrhea, constipation, blood in stool and abdominal distention.  Genitourinary: Negative for dysuria, urgency, frequency, hematuria, flank pain, vaginal bleeding, vaginal discharge, difficulty urinating, vaginal pain and pelvic pain.  Musculoskeletal: Negative for joint swelling, arthralgias and gait problem.  Skin: Negative for rash.  Neurological: Negative for dizziness, syncope, speech difficulty, weakness, numbness and headaches.  Hematological: Negative for adenopathy.  Psychiatric/Behavioral: Negative for behavioral problems, dysphoric mood and agitation. The patient is not nervous/anxious.        Objective:   Physical Exam  Constitutional: She appears well-developed and well-nourished. No distress.  Blood pressure 130/80  Pulmonary/Chest: No respiratory distress. She has no wheezes. She has no rales.          Assessment & Plan:   Hypertension ACE associated cough  We'll continue present regimen Low-salt  diet recommended Modest weight loss encouraged Home blood pressure monitoring recommended  Recheck 6 months

## 2012-05-30 NOTE — Patient Instructions (Addendum)
Limit your sodium (Salt) intake    It is important that you exercise regularly, at least 20 minutes 3 to 4 times per week.  If you develop chest pain or shortness of breath seek  medical attention.  Please check your blood pressure on a regular basis.  If it is consistently greater than 150/90, please make an office appointment.  Return in 6 months for follow-up   

## 2012-09-08 ENCOUNTER — Other Ambulatory Visit: Payer: Self-pay | Admitting: Internal Medicine

## 2012-09-11 ENCOUNTER — Encounter: Payer: Self-pay | Admitting: Gynecology

## 2012-09-11 ENCOUNTER — Other Ambulatory Visit: Payer: Self-pay

## 2012-09-11 ENCOUNTER — Ambulatory Visit (INDEPENDENT_AMBULATORY_CARE_PROVIDER_SITE_OTHER): Payer: Medicare Other | Admitting: Gynecology

## 2012-09-11 VITALS — BP 124/74 | Ht <= 58 in | Wt 147.0 lb

## 2012-09-11 DIAGNOSIS — M8580 Other specified disorders of bone density and structure, unspecified site: Secondary | ICD-10-CM

## 2012-09-11 DIAGNOSIS — N952 Postmenopausal atrophic vaginitis: Secondary | ICD-10-CM

## 2012-09-11 DIAGNOSIS — M899 Disorder of bone, unspecified: Secondary | ICD-10-CM

## 2012-09-11 NOTE — Patient Instructions (Signed)
Followup of bone density as scheduled. Followup in one year for her annual exam, sooner as needed.

## 2012-09-11 NOTE — Progress Notes (Signed)
Robin Francis February 16, 1945 [id number]        67 y.o.  [id number] for followup exam.  Former patient of Dr. Eda Paschal with several issues noted below.  Past medical history,surgical history, medications, allergies, family history and social history were all reviewed and documented in the EPIC chart.  ROS:  Performed and pertinent positives and negatives are included in the history, assessment and plan .  Exam: Kim assistant Filed Vitals:   09/11/12 1408  BP: 124/74  Height: 4\' 10"  (1.473 m)  Weight: 147 lb (66.679 kg)   General appearance  Normal Skin grossly normal Head/Neck normal with no cervical or supraclavicular adenopathy thyroid normal Lungs  clear Cardiac RR, without RMG Abdominal  soft, nontender, without masses, organomegaly or hernia Breasts  examined lying and sitting without masses, retractions, discharge or axillary adenopathy. Pelvic  Ext/BUS/vagina  normal with atrophic changes  Cervix  normal with atrophic changes  Uterus  anteverted, normal size, shape and contour, midline and mobile nontender   Adnexa  Without masses or tenderness    Anus and perineum  normal   Rectovaginal  normal sphincter tone without palpated masses or tenderness.    Assessment/Plan:  67 y.o. [id number] female for followup exam.   1. Postmenopausal/atrophic genital changes. Patient without significant symptoms such as hot flashes night sweats vaginal dryness or dyspareunia. We'll continue to monitor. Patient knows to report any bleeding. 2. Osteopenia. DEXA 09/2009 with T score -1.4. FRAX 8%/0.7%. Recommend repeat DEXA now and she agrees to schedule. Increase calcium vitamin D reviewed. 3. Pap smear 2011. No Pap smear done today. No history of significant abnormal Pap smears. Plan repeat Pap smear next year at 3 year interval. 4. Mammography 06/2012. Continue with annual mammography. SBE monthly reviewed. 5. Colonoscopy 2008 with recommended repeat at 10 years. 6. Health maintenance. Patient has  appointment for general medical checkup and no blood work was done today as it will all be done there. Followup in one year, sooner as needed.  Note: This document was prepared with digital dictation and possible smart phrase technology. Any transcriptional errors that result from this process are unintentional.   Dara Lords MD, 2:38 PM 09/11/2012

## 2012-09-12 LAB — URINALYSIS W MICROSCOPIC + REFLEX CULTURE
Bacteria, UA: NONE SEEN
Casts: NONE SEEN
Crystals: NONE SEEN
Ketones, ur: NEGATIVE mg/dL
Leukocytes, UA: NEGATIVE
Nitrite: NEGATIVE
Specific Gravity, Urine: 1.008 (ref 1.005–1.030)
Squamous Epithelial / LPF: NONE SEEN
pH: 6.5 (ref 5.0–8.0)

## 2012-09-26 ENCOUNTER — Other Ambulatory Visit (INDEPENDENT_AMBULATORY_CARE_PROVIDER_SITE_OTHER): Payer: Medicare Other

## 2012-09-26 DIAGNOSIS — Z0000 Encounter for general adult medical examination without abnormal findings: Secondary | ICD-10-CM

## 2012-09-26 DIAGNOSIS — I10 Essential (primary) hypertension: Secondary | ICD-10-CM

## 2012-09-26 LAB — CBC WITH DIFFERENTIAL/PLATELET
Basophils Absolute: 0 10*3/uL (ref 0.0–0.1)
HCT: 37.7 % (ref 36.0–46.0)
Hemoglobin: 13 g/dL (ref 12.0–15.0)
Lymphs Abs: 2.3 10*3/uL (ref 0.7–4.0)
MCHC: 34.6 g/dL (ref 30.0–36.0)
MCV: 89.3 fl (ref 78.0–100.0)
Monocytes Absolute: 0.6 10*3/uL (ref 0.1–1.0)
Neutro Abs: 4 10*3/uL (ref 1.4–7.7)
Platelets: 300 10*3/uL (ref 150.0–400.0)
RDW: 12.8 % (ref 11.5–14.6)

## 2012-09-26 LAB — LIPID PANEL
HDL: 68.2 mg/dL (ref >39.00)
Total CHOL/HDL Ratio: 3
Triglycerides: 42 mg/dL (ref 0.0–149.0)

## 2012-09-26 LAB — BASIC METABOLIC PANEL
CO2: 30 mEq/L (ref 19–32)
Calcium: 9.8 mg/dL (ref 8.4–10.5)
Chloride: 103 mEq/L (ref 96–112)
Creatinine, Ser: 0.8 mg/dL (ref 0.4–1.2)
Glucose, Bld: 96 mg/dL (ref 70–99)

## 2012-09-26 LAB — LDL CHOLESTEROL, DIRECT: Direct LDL: 136.8 mg/dL

## 2012-09-26 LAB — TSH: TSH: 2.35 u[IU]/mL (ref 0.35–5.50)

## 2012-09-26 LAB — HEPATIC FUNCTION PANEL
AST: 25 U/L (ref 0–37)
Albumin: 3.9 g/dL (ref 3.5–5.2)
Alkaline Phosphatase: 60 U/L (ref 39–117)
Total Bilirubin: 0.7 mg/dL (ref 0.3–1.2)

## 2012-10-03 ENCOUNTER — Encounter: Payer: Self-pay | Admitting: Internal Medicine

## 2012-10-03 ENCOUNTER — Ambulatory Visit (INDEPENDENT_AMBULATORY_CARE_PROVIDER_SITE_OTHER): Payer: Medicare Other | Admitting: Internal Medicine

## 2012-10-03 VITALS — BP 118/80 | HR 77 | Temp 98.0°F | Resp 20 | Ht <= 58 in | Wt 150.0 lb

## 2012-10-03 DIAGNOSIS — I10 Essential (primary) hypertension: Secondary | ICD-10-CM

## 2012-10-03 DIAGNOSIS — M179 Osteoarthritis of knee, unspecified: Secondary | ICD-10-CM

## 2012-10-03 DIAGNOSIS — Z23 Encounter for immunization: Secondary | ICD-10-CM

## 2012-10-03 DIAGNOSIS — M8580 Other specified disorders of bone density and structure, unspecified site: Secondary | ICD-10-CM

## 2012-10-03 DIAGNOSIS — Z0000 Encounter for general adult medical examination without abnormal findings: Secondary | ICD-10-CM

## 2012-10-03 DIAGNOSIS — N951 Menopausal and female climacteric states: Secondary | ICD-10-CM

## 2012-10-03 DIAGNOSIS — M1990 Unspecified osteoarthritis, unspecified site: Secondary | ICD-10-CM

## 2012-10-03 DIAGNOSIS — M899 Disorder of bone, unspecified: Secondary | ICD-10-CM

## 2012-10-03 DIAGNOSIS — M1710 Unilateral primary osteoarthritis, unspecified knee: Secondary | ICD-10-CM

## 2012-10-03 DIAGNOSIS — R1310 Dysphagia, unspecified: Secondary | ICD-10-CM

## 2012-10-03 DIAGNOSIS — IMO0002 Reserved for concepts with insufficient information to code with codable children: Secondary | ICD-10-CM

## 2012-10-03 NOTE — Patient Instructions (Addendum)
Take a calcium supplement, plus [phone] units of vitamin D    It is important that you exercise regularly, at least 20 minutes 3 to 4 times per week.  If you develop chest pain or shortness of breath seek  medical attention.  Return in one year for follow-up  Please check your blood pressure on a regular basis.  If it is consistently greater than 150/90, please make an office appointment.  You need to lose weight.  Consider a lower calorie diet and regular exercise.   GI consultation as discussed

## 2012-10-03 NOTE — Progress Notes (Signed)
Subjective:    Patient ID: Robin Francis, female    DOB: [birth year], 67 y.o.   MRN: [id number]  HPI 67 year-old patient who is seen today for a health maintenance examination. She enjoys excellent health. She is followed by orthopedics due to osteoarthritis. She is followed by gynecology and has had a colonoscopy in 2008. No new concerns or complaints today laboratory studies were reviewed and these were unremarkable. She has had a gynecologic evaluation earlier this month. A followup bone density study has been scheduled. She had a mammogram in the spring  Review of systems is largely unremarkable except for a one-year history of progressive solid food dysphagia. She has mild reflux symptoms and does take OTC medications for this about 2 times per week  She has hypertension which has been quite stable on combination therapy  Past medical history reviewed from the chart unremarkable except for orthopedic procedures Medications include meloxicam and a number of herbal products and vitamins Family history reviewed father died of suicide death mother died young from pancreatic cancer one brother remains well   1. Risk factors, based on past  M,S,F history-  no cardiovascular risk factors except hypertension which has been well-controlled  2.  Physical activities: Remains active without restrictions  3.  Depression/mood: No history depression or mood disorder  4.  Hearing: No deficits  5.  ADL's: Independent in all aspects of daily living  6.  Fall risk: Low  7.  Home safety: No problems identified  8.  Height weight, and visual acuity; height and weight stable no change in visual acuity  9.  Counseling: Regular exercise regimen encouraged calcium and vitamin D supplements encouraged  10. Lab orders based on risk factors: Laboratory profile reviewed  11. Referral : Followup gynecology in one year  12. Care plan: Continue active lifestyle. Home blood pressure monitoring  13.  Cognitive assessment: Alert and oriented normal affect. No cognitive dysfunction      Review of Systems  Constitutional: Negative for fever, appetite change, fatigue and unexpected weight change.  HENT: Negative for hearing loss, ear pain, nosebleeds, congestion, sore throat, mouth sores, trouble swallowing, neck stiffness, dental problem, voice change, sinus pressure and tinnitus.   Eyes: Negative for photophobia, pain, redness and visual disturbance.  Respiratory: Negative for cough, chest tightness and shortness of breath.   Cardiovascular: Negative for chest pain, palpitations and leg swelling.  Gastrointestinal: Negative for nausea, vomiting, abdominal pain, diarrhea, constipation, blood in stool, abdominal distention and rectal pain.  Genitourinary: Negative for dysuria, urgency, frequency, hematuria, flank pain, vaginal bleeding, vaginal discharge, difficulty urinating, genital sores, vaginal pain, menstrual problem and pelvic pain.  Musculoskeletal: Negative for back pain and arthralgias.  Skin: Negative for rash.  Neurological: Negative for dizziness, syncope, speech difficulty, weakness, light-headedness, numbness and headaches.  Hematological: Negative for adenopathy. Does not bruise/bleed easily.  Psychiatric/Behavioral: Negative for suicidal ideas, behavioral problems, self-injury, dysphoric mood and agitation. The patient is not nervous/anxious.        Objective:   Physical Exam  Constitutional: She is oriented to person, place, and time. She appears well-developed and well-nourished.  HENT:  Head: Normocephalic and atraumatic.  Right Ear: External ear normal.  Left Ear: External ear normal.  Mouth/Throat: Oropharynx is clear and moist.  Eyes: Conjunctivae and EOM are normal.  Neck: Normal range of motion. Neck supple. No JVD present. No thyromegaly present.  Cardiovascular: Normal rate, regular rhythm, normal heart sounds and intact distal pulses.   No murmur  heard.  Pulmonary/Chest: Effort normal and breath sounds normal. She has no wheezes. She has no rales.  Abdominal: Soft. Bowel sounds are normal. She exhibits no distension and no mass. There is no tenderness. There is no rebound and no guarding.  Musculoskeletal: Normal range of motion. She exhibits no edema and no tenderness.  Status post left TKR  Neurological: She is alert and oriented to person, place, and time. She has normal reflexes. No cranial nerve deficit. She exhibits normal muscle tone. Coordination normal.  Skin: Skin is warm and dry. No rash noted.  Psychiatric: She has a normal mood and affect. Her behavior is normal.          Assessment & Plan:   Preventive health examination  Progressive solid food dysphagia. We'll set up for upper endoscopy  We'll continue calcium and vitamin D supplements. We'll continue regular exercise. Efforts at  weight loss encouraged  return in one year for followup.

## 2012-10-09 ENCOUNTER — Encounter: Payer: Self-pay | Admitting: Internal Medicine

## 2012-10-10 ENCOUNTER — Encounter: Payer: Self-pay | Admitting: Gastroenterology

## 2012-10-31 ENCOUNTER — Ambulatory Visit (INDEPENDENT_AMBULATORY_CARE_PROVIDER_SITE_OTHER): Payer: Medicare Other

## 2012-10-31 ENCOUNTER — Encounter: Payer: Self-pay | Admitting: Gynecology

## 2012-10-31 DIAGNOSIS — M8580 Other specified disorders of bone density and structure, unspecified site: Secondary | ICD-10-CM

## 2012-10-31 DIAGNOSIS — M899 Disorder of bone, unspecified: Secondary | ICD-10-CM

## 2012-11-07 ENCOUNTER — Encounter: Payer: Self-pay | Admitting: Gastroenterology

## 2012-11-07 ENCOUNTER — Ambulatory Visit (INDEPENDENT_AMBULATORY_CARE_PROVIDER_SITE_OTHER): Payer: Medicare Other | Admitting: Gastroenterology

## 2012-11-07 VITALS — BP 120/86 | HR 67 | Ht <= 58 in | Wt 148.0 lb

## 2012-11-07 DIAGNOSIS — Z1211 Encounter for screening for malignant neoplasm of colon: Secondary | ICD-10-CM

## 2012-11-07 DIAGNOSIS — R1310 Dysphagia, unspecified: Secondary | ICD-10-CM

## 2012-11-07 MED ORDER — PANTOPRAZOLE SODIUM 40 MG PO TBEC: 40.0000 mg | DELAYED_RELEASE_TABLET | Freq: Every day | ORAL | Status: DC

## 2012-11-07 NOTE — Assessment & Plan Note (Signed)
Followup colonoscopy 2018 

## 2012-11-07 NOTE — Progress Notes (Signed)
History of Present Illness: Robin Francis 67yo white female referred for evaluation of dysphagia.  Over the past year she's been experiencing dysphagia to solids.  She's had food impactions that have resolved with either vomiting or by eventually passing food into her stomach, preceded by drinking copious amounts of water.  With dysphagia she may have mild  odynophagia.  She has occasional pyrosis.    Past Medical History  Diagnosis Date  . MENOPAUSAL SYNDROME 09/17/2008  . OSTEOARTHRITIS 09/17/2008  . Osteopenia 10/2012    T score -1.2 FRAX 8.1%/0.7% stable from prior DEXA   Past Surgical History  Procedure Laterality Date  . Knee arthroscopy      left  . Back surgery    . Trigger finger release    . Tonsillectomy      as child  . 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 Current Outpatient Prescriptions  Medication Sig Dispense Refill  . Ascorbic Acid (VITAMIN C PO) Take by mouth.      Marland Kitchen aspirin 81 MG tablet Take 81 mg by mouth daily.      Marland Kitchen b complex vitamins capsule Take 1 capsule by mouth daily.      . Calcium Carbonate-Vitamin D (CALCIUM + D PO) Take by mouth.      . cetirizine (ZYRTEC) 10 MG tablet Take 10 mg by mouth daily.      . Cholecalciferol (VITAMIN D PO) Take by mouth.      . famotidine (PEPCID) 20 MG tablet Take 20 mg by mouth as needed for heartburn.      . fish oil-omega-3 fatty acids 1000 MG capsule Take 1 g by mouth daily.      . Glucosamine-Chondroitin-MSM (TRIPLE FLEX PO) Take 2 tablets by mouth.      . losartan-hydrochlorothiazide (HYZAAR) 100-25 MG per tablet Take 1 tablet by mouth daily.  90 tablet  4  . meloxicam (MOBIC) 15 MG tablet TAKE 1 TABLET BY MOUTH EVERY DAY  90 tablet  1  . Multiple Vitamin (MULTIVITAMIN) tablet Take 1 tablet by  mouth daily.      Marland Kitchen VITAMIN E PO Take by mouth.       No current facility-administered medications for this visit.   Allergies as of 11/07/2012 - Review Complete 11/07/2012  Allergen Reaction Noted  . Lisinopril  05/30/2012    reports that she has never smoked. She has never used smokeless tobacco. She reports that she drinks about 2.0 ounces of alcohol per week. She reports that she does not use illicit drugs.     Review of Systems: Pertinent positive and negative review of systems were noted in the above HPI section. All other review of systems were otherwise negative.  Vital signs were reviewed in today's medical record Physical Exam: General: Well developed , well nourished, no acute distress Skin: anicteric Head: Normocephalic and atraumatic Eyes:  sclerae anicteric, EOMI Ears: Normal auditory acuity Mouth: No deformity or lesions Neck: Supple, no masses or thyromegaly Lungs: Clear throughout to auscultation Heart: Regular rate and rhythm; no murmurs, rubs or bruits Abdomen: Soft, non tender and non distended. No masses, hepatosplenomegaly or hernias noted. Normal Bowel sounds Rectal:deferred Musculoskeletal: Symmetrical with no gross deformities  Skin: No lesions on visible extremities Pulses:  Normal pulses noted Extremities: No clubbing, cyanosis,  edema or deformities noted Neurological: Alert oriented x 4, grossly nonfocal Cervical Nodes:  No significant cervical adenopathy Inguinal Nodes: No significant inguinal adenopathy Psychological:  Alert and cooperative. Normal mood and affect

## 2012-11-07 NOTE — Assessment & Plan Note (Addendum)
One year history of dysphagia to solids.  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  Recommendations #1 begin protonix 40 mg daily #2 endoscopy with dilatation as indicated  Risks, alternatives, and complications of the procedure, including bleeding, perforation, and possible need for surgery, were explained to the patient.  Patient's questions were answered.

## 2012-11-07 NOTE — Patient Instructions (Addendum)

## 2012-11-11 ENCOUNTER — Encounter: Payer: Self-pay | Admitting: Gastroenterology

## 2012-11-26 ENCOUNTER — Encounter: Payer: Self-pay | Admitting: Gastroenterology

## 2012-11-29 ENCOUNTER — Ambulatory Visit (AMBULATORY_SURGERY_CENTER): Payer: Medicare Other | Admitting: Gastroenterology

## 2012-11-29 ENCOUNTER — Encounter: Payer: Self-pay | Admitting: Gastroenterology

## 2012-11-29 ENCOUNTER — Encounter: Payer: Self-pay | Admitting: *Deleted

## 2012-11-29 VITALS — BP 121/74 | HR 65 | Temp 98.3°F | Resp 18 | Ht <= 58 in | Wt 148.0 lb

## 2012-11-29 DIAGNOSIS — K209 Esophagitis, unspecified without bleeding: Secondary | ICD-10-CM

## 2012-11-29 DIAGNOSIS — K319 Disease of stomach and duodenum, unspecified: Secondary | ICD-10-CM

## 2012-11-29 DIAGNOSIS — K222 Esophageal obstruction: Secondary | ICD-10-CM

## 2012-11-29 DIAGNOSIS — K259 Gastric ulcer, unspecified as acute or chronic, without hemorrhage or perforation: Secondary | ICD-10-CM

## 2012-11-29 DIAGNOSIS — R1310 Dysphagia, unspecified: Secondary | ICD-10-CM

## 2012-11-29 MED ORDER — SODIUM CHLORIDE 0.9 % IV SOLN: 500.0000 mL | INTRAVENOUS | Status: DC

## 2012-11-29 NOTE — Progress Notes (Signed)
Called to room to assist during endoscopic procedure.  Patient ID and intended procedure confirmed with present staff. Received instructions for my participation in the procedure from the performing physician.  

## 2012-11-29 NOTE — Patient Instructions (Signed)
YOU HAD AN ENDOSCOPIC PROCEDURE TODAY AT THE  ENDOSCOPY CENTER: Refer to the procedure report that was given to you for any specific questions about what was found during the examination.  If the procedure report does not answer your questions, please call your gastroenterologist to clarify.  If you requested that your care partner not be given the details of your procedure findings, then the procedure report has been included in a sealed envelope for you to review at your convenience later.  YOU SHOULD EXPECT: Some feelings of bloating in the abdomen. Passage of more gas than usual.  Walking can help get rid of the air that was put into your GI tract during the procedure and reduce the bloating. If you had a lower endoscopy (such as a colonoscopy or flexible sigmoidoscopy) you may notice spotting of blood in your stool or on the toilet paper. If you underwent a bowel prep for your procedure, then you may not have a normal bowel movement for a few days.  DIET: Your first meal following the procedure should be a light meal and then it is ok to progress to your normal diet.  A half-sandwich or bowl of soup is an example of a good first meal.  Heavy or fried foods are harder to digest and may make you feel nauseous or bloated.  Likewise meals heavy in dairy and vegetables can cause extra gas to form and this can also increase the bloating.  Drink plenty of fluids but you should avoid alcoholic beverages for 24 hours.  ACTIVITY: Your care partner should take you home directly after the procedure.  You should plan to take it easy, moving slowly for the rest of the day.  You can resume normal activity the day after the procedure however you should NOT DRIVE or use heavy machinery for 24 hours (because of the sedation medicines used during the test).    SYMPTOMS TO REPORT IMMEDIATELY: A gastroenterologist can be reached at any hour.  During normal business hours, 8:30 AM to 5:00 PM Monday through Friday,  call [phone].  After hours and on weekends, please call the GI answering service at [phone] who will take a message and have the physician on call contact you.   Following upper endoscopy (EGD)  Vomiting of blood or coffee ground material  New chest pain or pain under the shoulder blades  Painful or persistently difficult swallowing  New shortness of breath  Fever of 100F or higher  Black, tarry-looking stools  FOLLOW UP: If any biopsies were taken you will be contacted by phone or by letter within the next 1-3 weeks.  Call your gastroenterologist if you have not heard about the biopsies in 3 weeks.  Our staff will call the home number listed on your records the next business day following your procedure to check on you and address any questions or concerns that you may have at that time regarding the information given to you following your procedure. This is a courtesy call and so if there is no answer at the home number and we have not heard from you through the emergency physician on call, we will assume that you have returned to your regular daily activities without incident.  SIGNATURES/CONFIDENTIALITY: You and/or your care partner have signed paperwork which will be entered into your electronic medical record.  These signatures attest to the fact that that the information above on your After Visit Summary has been reviewed and is understood.  Full responsibility   of the confidentiality of this discharge information lies with you and/or your care-partner.  Recommendations See procedure report 

## 2012-11-29 NOTE — Progress Notes (Signed)
Procedure ends, to recovery, report given and VSS. 

## 2012-11-29 NOTE — Op Note (Signed)
[facility] [address] [place], [address]   ENDOSCOPY PROCEDURE REPORT  PATIENT: [name], [name]  MR#: [id number] BIRTHDATE: 1946-01-01 , 67  yrs. old GENDER: Female ENDOSCOPIST: Louis Meckel, MD REFERRED BY:  Eleonore Chiquito, M.D. PROCEDURE DATE:  11/29/2012 PROCEDURE:  EGD w/ biopsy and Maloney dilation of esophagus ASA CLASS:     Class II INDICATIONS:  Dysphagia. MEDICATIONS: MAC sedation, administered by CRNA, propofol (Diprivan) 250mg  IV, and Simethicone 0.6cc PO TOPICAL ANESTHETIC:  DESCRIPTION OF PROCEDURE: After the risks benefits and alternatives of the procedure were thoroughly explained, informed consent was obtained.  The LB [id number] endoscope was introduced through the mouth and advanced to the third portion of the duodenum. Without limitations.  The instrument was slowly withdrawn as the mucosa was fully examined.      The GE junction was at approximately 34 cm from the incisors.  There was a moderate stricture at the GE junction.  The 9.8 mm gastroscope easily traversed the stricture.  Because was slightly friable at the GE junction.  Biopsies were taken. In the gastric antrum there were at least 2 superficial ulcers with surrounding erythema.  Biopsies were taken.   The remainder of the upper endoscopy exam was otherwise normal.  Retroflexed views revealed no abnormalities.     The scope was then withdrawn from the patient.  A #52 Jerene Dilling dilator was passed with moderate resistance.  There was no heme.  COMPLICATIONS: There were no complications.  ENDOSCOPIC IMPRESSION: 1.  esophageal stricture-status post Maloney dilation 2.  superficial gastric ulcer 3.  esophagitis  RECOMMENDATIONS: 1.  Await pathology findings 2.  hold NSAIDs; continue protonix 3.  office visit one month  REPEAT EXAM:  eSigned:  Louis Meckel, MD 11/29/2012 2:45 PM   CC:  PATIENT NAME:  Robin Francis, Robin Francis MR#: [id number]

## 2012-11-29 NOTE — Progress Notes (Signed)
Patient did not have preoperative order for IV antibiotic SSI prophylaxis. (G8918)  Patient did not experience any of the following events: a burn prior to discharge; a fall within the facility; wrong site/side/patient/procedure/implant event; or a hospital transfer or hospital admission upon discharge from the facility. (G8907)  

## 2012-12-02 ENCOUNTER — Telehealth: Payer: Self-pay | Admitting: *Deleted

## 2012-12-02 NOTE — Telephone Encounter (Signed)
  Follow up Call-  Call back number 11/29/2012  Post procedure Call Back phone  # [phone]  Permission to leave phone message Yes     Patient questions:  Do you have a fever, pain , or abdominal swelling? no Pain Score  0 *  Have you tolerated food without any problems? yes  Have you been able to return to your normal activities? yes  Do you have any questions about your discharge instructions: Diet   no Medications  no Follow up visit  no  Do you have questions or concerns about your Care? yes  Actions: * If pain score is 4 or above: No action needed, pain <4.

## 2012-12-03 ENCOUNTER — Encounter: Payer: Self-pay | Admitting: Gastroenterology

## 2012-12-12 ENCOUNTER — Telehealth: Payer: Self-pay | Admitting: Gastroenterology

## 2012-12-12 ENCOUNTER — Other Ambulatory Visit: Payer: Self-pay

## 2012-12-12 MED ORDER — TRAMADOL HCL 50 MG PO TABS: 50.0000 mg | ORAL_TABLET | Freq: Four times a day (QID) | ORAL | Status: DC | PRN

## 2012-12-12 NOTE — Telephone Encounter (Signed)
Script called to pharmacy and pt aware. 

## 2012-12-12 NOTE — Telephone Encounter (Signed)
Pt has been off of NSAIDS since 11/29/12. Pt states her knees are killing her and wants to know if there is something other than tylenol that she can take. Please advise.

## 2012-12-12 NOTE — Telephone Encounter (Signed)
Try Ultram 50-100 mg every 6 hours when necessary.   If that is insufficient she should call back

## 2012-12-16 ENCOUNTER — Encounter: Payer: Self-pay | Admitting: Internal Medicine

## 2012-12-16 ENCOUNTER — Ambulatory Visit (INDEPENDENT_AMBULATORY_CARE_PROVIDER_SITE_OTHER): Payer: Medicare Other | Admitting: Internal Medicine

## 2012-12-16 VITALS — BP 120/78 | HR 59 | Temp 97.5°F | Resp 20 | Wt 153.0 lb

## 2012-12-16 DIAGNOSIS — R102 Pelvic and perineal pain: Secondary | ICD-10-CM

## 2012-12-16 DIAGNOSIS — M549 Dorsalgia, unspecified: Secondary | ICD-10-CM

## 2012-12-16 DIAGNOSIS — M1990 Unspecified osteoarthritis, unspecified site: Secondary | ICD-10-CM

## 2012-12-16 DIAGNOSIS — I10 Essential (primary) hypertension: Secondary | ICD-10-CM

## 2012-12-16 LAB — POCT URINALYSIS DIPSTICK
Bilirubin, UA: NEGATIVE
Blood, UA: NEGATIVE
Glucose, UA: NEGATIVE
Ketones, UA: NEGATIVE
Leukocytes, UA: NEGATIVE
Protein, UA: NEGATIVE
Spec Grav, UA: 1.01

## 2012-12-16 NOTE — Progress Notes (Signed)
Subjective:    Patient ID: Robin Francis, female    DOB: Jul 07, 1945, 67 y.o.   MRN: [id number]  HPI 67 year old patient who presents with a three-day history of low back pain. This was maximal in the right flank area and she was somewhat concerned about a ladder infection. Over the weekend she had some pain in the lower mid abdominal area as well. No urinary frequency or dysuria. She does have a history of osteoarthritis. Today she feels better. There's been no fever.  Past Medical History  Diagnosis Date  . MENOPAUSAL SYNDROME 09/17/2008  . OSTEOARTHRITIS 09/17/2008  . Osteopenia 10/2012    T score -1.2 FRAX 8.1%/0.7% stable from prior DEXA  . GERD (gastroesophageal reflux disease)   . Hypertension     History   Social History  . Marital Status: Married    Spouse Name: N/A    Number of Children: N/A  . Years of Education: N/A   Occupational History  . Not on file.   Social History Main Topics  . Smoking status: Never Smoker   . Smokeless tobacco: Never Used  . Alcohol Use: 2.0 oz/week    4 drink(s) per week     Comment: occassionally-social  . Drug Use: No  . Sexual Activity: Yes    Birth Control/ Protection: Post-menopausal   Other Topics Concern  . Not on file   Social History Narrative  . No narrative on file    Past Surgical History  Procedure Laterality Date  . Knee arthroscopy      left  . Back surgery    . Trigger finger release    . Tonsillectomy      as child  . Total knee arthroplasty  04/03/2011    Procedure: TOTAL KNEE ARTHROPLASTY;  Surgeon: Loanne Drilling, MD;  Location: [facility];  Service: Orthopedics;  Laterality: Left;    Family History  Problem Relation Age of Onset  . Pancreatic cancer Mother   . Breast cancer Maternal Grandmother     Age 67    Allergies  Allergen Reactions  . Lisinopril     Cough    Current Outpatient Prescriptions on File Prior to Visit  Medication Sig Dispense Refill  . Ascorbic Acid (VITAMIN C PO) Take by  mouth.      Marland Kitchen b complex vitamins capsule Take 1 capsule by mouth daily.      . Calcium Carbonate-Vitamin D (CALCIUM + D PO) Take by mouth.      . cetirizine (ZYRTEC) 10 MG tablet Take 10 mg by mouth daily.      . Cholecalciferol (VITAMIN D PO) Take by mouth.      . fish oil-omega-3 fatty acids 1000 MG capsule Take 1 g by mouth daily.      Marland Kitchen losartan-hydrochlorothiazide (HYZAAR) 100-25 MG per tablet Take 1 tablet by mouth daily.  90 tablet  4  . meloxicam (MOBIC) 15 MG tablet TAKE 1 TABLET BY MOUTH EVERY DAY  90 tablet  1  . Multiple Vitamin (MULTIVITAMIN) tablet Take 1 tablet by mouth daily.      . pantoprazole (PROTONIX) 40 MG tablet Take 1 tablet (40 mg total) by mouth daily.  30 tablet  3  . traMADol (ULTRAM) 50 MG tablet Take 1 tablet (50 mg total) by mouth every 6 (six) hours as needed.  30 tablet  0  . VITAMIN E PO Take by mouth.       No current facility-administered medications on file prior to visit.  BP 120/78  Pulse 59  Temp(Src) 97.5 F (36.4 C) (Oral)  Resp 20  Wt 153 lb (69.4 kg)  SpO2 95%        Review of Systems  Constitutional: Negative.   HENT: Negative for congestion, dental problem, hearing loss, rhinorrhea, sinus pressure, sore throat and tinnitus.   Eyes: Negative for pain, discharge and visual disturbance.  Respiratory: Negative for cough and shortness of breath.   Cardiovascular: Negative for chest pain, palpitations and leg swelling.  Gastrointestinal: Positive for abdominal pain. Negative for nausea, vomiting, diarrhea, constipation, blood in stool and abdominal distention.  Genitourinary: Negative for dysuria, urgency, frequency, hematuria, flank pain, vaginal bleeding, vaginal discharge, difficulty urinating, vaginal pain and pelvic pain.  Musculoskeletal: Positive for back pain. Negative for arthralgias, gait problem and joint swelling.  Skin: Negative for rash.  Neurological: Negative for dizziness, syncope, speech difficulty, weakness, numbness  and headaches.  Hematological: Negative for adenopathy.  Psychiatric/Behavioral: Negative for behavioral problems, dysphoric mood and agitation. The patient is not nervous/anxious.        Objective:   Physical Exam  Constitutional: She is oriented to person, place, and time. She appears well-developed and well-nourished.  HENT:  Head: Normocephalic.  Right Ear: External ear normal.  Left Ear: External ear normal.  Mouth/Throat: Oropharynx is clear and moist.  Eyes: Conjunctivae and EOM are normal. Pupils are equal, round, and reactive to light.  Neck: Normal range of motion. Neck supple. No thyromegaly present.  Cardiovascular: Normal rate, regular rhythm, normal heart sounds and intact distal pulses.   Pulmonary/Chest: Effort normal and breath sounds normal.  Abdominal: Soft. Bowel sounds are normal. She exhibits no mass. There is no tenderness.  Musculoskeletal: Normal range of motion.  Lymphadenopathy:    She has no cervical adenopathy.  Neurological: She is alert and oriented to person, place, and time.  Skin: Skin is warm and dry. No rash noted.  Psychiatric: She has a normal mood and affect. Her behavior is normal.          Assessment & Plan:  History back and abdominal pain. Largely resolved. Clinical exam is normal we used Tylenol or Tylenol when necessary and clinically observe at this time Hypertension stable History of osteoarthritis

## 2012-12-16 NOTE — Patient Instructions (Signed)
Call or return to clinic prn if these symptoms worsen or fail to improve as anticipated.

## 2013-01-06 ENCOUNTER — Ambulatory Visit (INDEPENDENT_AMBULATORY_CARE_PROVIDER_SITE_OTHER): Payer: Medicare Other | Admitting: Gastroenterology

## 2013-01-06 ENCOUNTER — Encounter: Payer: Self-pay | Admitting: Gastroenterology

## 2013-01-06 VITALS — BP 120/78 | HR 97 | Wt 150.8 lb

## 2013-01-06 DIAGNOSIS — K222 Esophageal obstruction: Secondary | ICD-10-CM | POA: Insufficient documentation

## 2013-01-06 DIAGNOSIS — R1310 Dysphagia, unspecified: Secondary | ICD-10-CM

## 2013-01-06 NOTE — Assessment & Plan Note (Signed)
Resolved following dilatation of a distal esophageal stricture.  Plan repeat dilation when necessary

## 2013-01-06 NOTE — Patient Instructions (Signed)
Follow up as needed

## 2013-01-06 NOTE — Progress Notes (Signed)
History of Present Illness: The patient has returned following dilation of a distal esophageal stricture.  She reports complete resolution of dysphagia.  A superficial gastric ulcer was noted.  She remains on protonix.  She has no other GI complaints.    Past Medical History  Diagnosis Date  . MENOPAUSAL SYNDROME 09/17/2008  . OSTEOARTHRITIS 09/17/2008  . Osteopenia 10/2012    T score -1.2 FRAX 8.1%/0.7% stable from prior DEXA  . GERD (gastroesophageal reflux disease)   . Hypertension    Past Surgical History  Procedure Laterality Date  . Knee arthroscopy      left  . Back surgery    . Trigger finger release    . Tonsillectomy      as child  . 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 Current Outpatient Prescriptions  Medication Sig Dispense Refill  . Ascorbic Acid (VITAMIN C PO) Take by mouth.      Marland Kitchen b complex vitamins capsule Take 1 capsule by mouth daily.      . Calcium Carbonate-Vitamin D (CALCIUM + D PO) Take by mouth.      . cetirizine (ZYRTEC) 10 MG tablet Take 10 mg by mouth daily.      . Cholecalciferol (VITAMIN D PO) Take by mouth.      Marland Kitchen CINNAMON PO Take 1,000 mg by mouth daily.      . Coenzyme Q10 (CO Q-10) 120 MG CAPS Take by mouth.      . fish oil-omega-3 fatty acids 1000 MG capsule Take 1 g by mouth daily.      Marland Kitchen losartan-hydrochlorothiazide (HYZAAR) 100-25 MG per tablet Take 1 tablet by mouth daily.  90 tablet  4  . Multiple Vitamin (MULTIVITAMIN) tablet Take 1 tablet by mouth daily.      . NON FORMULARY Take 500 mg by mouth daily. Mangosteen      . pantoprazole (PROTONIX) 40 MG tablet Take 1 tablet (40 mg total) by mouth daily.  30 tablet  3  . Red Yeast Rice 600 MG CAPS Take 1,800 mg by mouth daily.      Marland Kitchen VITAMIN E PO Take by mouth.        No current facility-administered medications for this visit.   Allergies as of 01/06/2013 - Review Complete 01/06/2013  Allergen Reaction Noted  . Lisinopril  05/30/2012    reports that she has never smoked. She has never used smokeless tobacco. She reports that she drinks about 2.0 ounces of alcohol per week. She reports that she does not use illicit drugs.     Review of Systems: Pertinent positive and negative review of systems were noted in the above HPI section. All other review of systems were otherwise negative.  Vital signs were reviewed in today's medical record Physical Exam: General: Well developed , well nourished, no acute distress

## 2013-02-23 ENCOUNTER — Other Ambulatory Visit: Payer: Self-pay | Admitting: Gastroenterology

## 2013-04-22 ENCOUNTER — Encounter: Payer: Self-pay | Admitting: Internal Medicine

## 2013-04-22 ENCOUNTER — Ambulatory Visit (INDEPENDENT_AMBULATORY_CARE_PROVIDER_SITE_OTHER): Payer: Medicare Other | Admitting: Internal Medicine

## 2013-04-22 VITALS — BP 136/70 | HR 61 | Temp 98.3°F | Resp 20 | Ht <= 58 in | Wt 153.0 lb

## 2013-04-22 DIAGNOSIS — R071 Chest pain on breathing: Secondary | ICD-10-CM

## 2013-04-22 DIAGNOSIS — I10 Essential (primary) hypertension: Secondary | ICD-10-CM

## 2013-04-22 DIAGNOSIS — R0789 Other chest pain: Secondary | ICD-10-CM

## 2013-04-22 NOTE — Progress Notes (Signed)
Pre-visit discussion using our clinic review tool. No additional management support is needed unless otherwise documented below in the visit note.  

## 2013-04-22 NOTE — Progress Notes (Signed)
Subjective:    Patient ID: Robin Francis, female    DOB: [birth year], 68 y.o.   MRN: [id number]  HPI  68 year old patient who has a history of essential hypertension.  For the past several days.  She has had some discomfort in the left neck, upper back and shoulder region.  She believes that she strained the soft tissues while shoveling snow.  She also feels a sensation of fullness in the left axilla.  She did have a mammogram in the spring of last year.  She has not noticed any breast mass.  Yesterday, when she lifted a heavy bag of dirt.  She feels that she aggravated her left anterior chest wall pain.  She has also noted discomfort over the left sternoclavicular joint region\  Past Medical History  Diagnosis Date  . MENOPAUSAL SYNDROME 09/17/2008  . OSTEOARTHRITIS 09/17/2008  . Osteopenia 10/2012    T score -1.2 FRAX 8.1%/0.7% stable from prior DEXA  . GERD (gastroesophageal reflux disease)   . Hypertension     History   Social History  . Marital Status: Married    Spouse Name: N/A    Number of Children: N/A  . Years of Education: N/A   Occupational History  . Not on file.   Social History Main Topics  . Smoking status: Never Smoker   . Smokeless tobacco: Never Used  . Alcohol Use: 2.0 oz/week    4 drink(s) per week     Comment: occassionally-social  . Drug Use: No  . Sexual Activity: Yes    Birth Control/ Protection: Post-menopausal   Other Topics Concern  . Not on file   Social History Narrative  . No narrative on file    Past Surgical History  Procedure Laterality Date  . Knee arthroscopy      left  . Back surgery    . Trigger finger release    . Tonsillectomy      as child  . Total knee arthroplasty  04/03/2011    Procedure: TOTAL KNEE ARTHROPLASTY;  Surgeon: Gearlean Alf, MD;  Location: [facility];  Service: Orthopedics;  Laterality: Left;    Family History  Problem Relation Age of Onset  . Pancreatic cancer Mother   . Breast cancer Maternal Grandmother      Age 18    Allergies  Allergen Reactions  . Lisinopril     Cough    Current Outpatient Prescriptions on File Prior to Visit  Medication Sig Dispense Refill  . Ascorbic Acid (VITAMIN C PO) Take by mouth.      Marland Kitchen b complex vitamins capsule Take 1 capsule by mouth daily.      . Calcium Carbonate-Vitamin D (CALCIUM + D PO) Take by mouth.      . cetirizine (ZYRTEC) 10 MG tablet Take 10 mg by mouth daily.      . Cholecalciferol (VITAMIN D PO) Take by mouth.      Marland Kitchen CINNAMON PO Take 1,000 mg by mouth daily.      . Coenzyme Q10 (CO Q-10) 120 MG CAPS Take by mouth.      . fish oil-omega-3 fatty acids 1000 MG capsule Take 1 g by mouth daily.      Marland Kitchen losartan-hydrochlorothiazide (HYZAAR) 100-25 MG per tablet Take 1 tablet by mouth daily.  90 tablet  4  . Multiple Vitamin (MULTIVITAMIN) tablet Take 1 tablet by mouth daily.      . NON FORMULARY Take 500 mg by mouth daily. Mangosteen      .  pantoprazole (PROTONIX) 40 MG tablet TAKE 1 TABLET BY MOUTH EVERY DAY  30 tablet  3  . Red Yeast Rice 600 MG CAPS Take 1,800 mg by mouth daily.      Marland Kitchen VITAMIN E PO Take by mouth.       No current facility-administered medications on file prior to visit.    BP 136/70  Pulse 61  Temp(Src) 98.3 F (36.8 C) (Oral)  Resp 20  Ht 4\' 9"  (1.448 m)  Wt 153 lb (69.4 kg)  BMI 33.10 kg/m2       Review of Systems  Constitutional: Negative.   HENT: Negative for congestion, dental problem, hearing loss, rhinorrhea, sinus pressure, sore throat and tinnitus.   Eyes: Negative for pain, discharge and visual disturbance.  Respiratory: Negative for cough and shortness of breath.   Cardiovascular: Positive for chest pain. Negative for palpitations and leg swelling.  Gastrointestinal: Negative for nausea, vomiting, abdominal pain, diarrhea, constipation, blood in stool and abdominal distention.  Genitourinary: Negative for dysuria, urgency, frequency, hematuria, flank pain, vaginal bleeding, vaginal discharge,  difficulty urinating, vaginal pain and pelvic pain.  Musculoskeletal: Negative for arthralgias, gait problem and joint swelling.  Skin: Negative for rash.  Neurological: Negative for dizziness, syncope, speech difficulty, weakness, numbness and headaches.  Hematological: Negative for adenopathy.  Psychiatric/Behavioral: Negative for behavioral problems, dysphoric mood and agitation. The patient is not nervous/anxious.        Objective:   Physical Exam  Constitutional: She appears well-developed and well-nourished.  Neck: Normal range of motion. Neck supple.  Pulmonary/Chest: Effort normal and breath sounds normal.  Tenderness over the left sternoclavicular joint area No cervical or supraclavicular or axillary adenopathy Breasts examination normal  Lymphadenopathy:    She has no cervical adenopathy.          Assessment & Plan:   Chest wall pain.  Soft tissue overuse injury No evidence of left axillary acknowledging  We'll observe at the present time.  The patient will continue self-examination and report any change  Hypertension stable

## 2013-04-22 NOTE — Patient Instructions (Signed)
Call or return to clinic prn if these symptoms worsen or fail to improve as anticipated.

## 2013-04-23 ENCOUNTER — Telehealth: Payer: Self-pay | Admitting: Internal Medicine

## 2013-04-23 NOTE — Telephone Encounter (Signed)
Relevant patient education assigned to patient using Emmi. ° °

## 2013-06-03 ENCOUNTER — Other Ambulatory Visit: Payer: Self-pay | Admitting: Internal Medicine

## 2013-06-19 ENCOUNTER — Other Ambulatory Visit: Payer: Self-pay | Admitting: Gastroenterology

## 2013-07-07 LAB — HM MAMMOGRAPHY: HM MAMMO: NEGATIVE

## 2013-07-08 ENCOUNTER — Encounter: Payer: Self-pay | Admitting: Gynecology

## 2013-07-11 ENCOUNTER — Encounter: Payer: Self-pay | Admitting: Internal Medicine

## 2013-08-30 ENCOUNTER — Other Ambulatory Visit: Payer: Self-pay | Admitting: Internal Medicine

## 2013-09-12 ENCOUNTER — Encounter: Payer: Medicare Other | Admitting: Gynecology

## 2013-09-30 ENCOUNTER — Encounter: Payer: Self-pay | Admitting: Gynecology

## 2013-09-30 ENCOUNTER — Other Ambulatory Visit (HOSPITAL_COMMUNITY)
Admission: RE | Admit: 2013-09-30 | Discharge: 2013-09-30 | Disposition: A | Discharge status: Home / Self Care | Payer: Medicare Other | Source: Ambulatory Visit | Attending: Gynecology | Admitting: Gynecology

## 2013-09-30 ENCOUNTER — Ambulatory Visit (INDEPENDENT_AMBULATORY_CARE_PROVIDER_SITE_OTHER): Payer: Medicare Other | Admitting: Gynecology

## 2013-09-30 VITALS — BP 120/78 | Ht <= 58 in | Wt 157.0 lb

## 2013-09-30 DIAGNOSIS — Z124 Encounter for screening for malignant neoplasm of cervix: Secondary | ICD-10-CM | POA: Diagnosis present

## 2013-09-30 DIAGNOSIS — M949 Disorder of cartilage, unspecified: Secondary | ICD-10-CM

## 2013-09-30 DIAGNOSIS — M899 Disorder of bone, unspecified: Secondary | ICD-10-CM

## 2013-09-30 DIAGNOSIS — M8580 Other specified disorders of bone density and structure, unspecified site: Secondary | ICD-10-CM

## 2013-09-30 DIAGNOSIS — N952 Postmenopausal atrophic vaginitis: Secondary | ICD-10-CM

## 2013-09-30 NOTE — Addendum Note (Signed)
Addended by: Nelva Nay on: 09/30/2013 11:02 AM   Modules accepted: Orders

## 2013-09-30 NOTE — Progress Notes (Signed)
RHEN DOSSANTOS 06/04/1945 [id number]        68 y.o.  [id number] for followup exam. Several issues noted below.  Past medical history,surgical history, problem list, medications, allergies, family history and social history were all reviewed and documented as reviewed in the EPIC chart.  ROS:  12 system ROS performed with pertinent positives and negatives included in the history, assessment and plan.   Additional significant findings :  Swelling in the left axilla   Exam: Kim assistant Filed Vitals:   09/30/13 1022  BP: 120/78  Height: 4\' 10"  (1.473 m)  Weight: 157 lb (71.215 kg)   General appearance:  Normal affect, orientation and appearance. Skin: Grossly normal HEENT: Without gross lesions.  No cervical or supraclavicular adenopathy. Thyroid normal.  Lungs:  Clear without wheezing, rales or rhonchi Cardiac: RR, without RMG Abdominal:  Soft, nontender, without masses, guarding, rebound, organomegaly or hernia Breasts:  Examined lying and sitting without masses, retractions, discharge or axillary adenopathy. Left axilla without any palpable abnormalities. Pelvic:  Ext/BUS/vagina generalized atrophic changes  Cervix atrophic changes. Pap done  Uterus anteverted, normal size, shape and contour, midline and mobile nontender   Adnexa  Without masses or tenderness    Anus and perineum  Normal   Rectovaginal  Normal sphincter tone without palpated masses or tenderness.    Assessment/Plan:  68 y.o. [id number] female for followup exam.   1. Postmenopausal/atrophic genital changes. Patient without significant symptoms of hot flushes, night sweats, vaginal dryness. Not currently sexually active. No bleeding. Continue to monitor. Report any bleeding. 2. Left axillary fullness. Patient has noted of the last 6 months or so. No palpable abnormalities to the patient just feels full. Exam is normal. Suspect a fat pad that she's feeling. Will schedule ultrasound of the axilla for reassurance and she'll  followup for this. 3. Mammography 07/2013. Continue with annual mammography. SBE monthly reviewed. 4. Osteopenia. DEXA 10/2012 T score -1.2 FRAX 8.1%/0.7%. Stable from prior DEXA. Plan repeat DEXA next year a two-year interval. Increase calcium vitamin D reviewed. 5. Pap smear 2012. Pap smear done today. No history of abnormal Pap smears previously. Options stop screening altogether or less frequent screening intervals as she is over the age of 57 reviewed. Will readdress on annual basis. 6. Colonoscopy 2008 with planned repeat at 10 year interval. 7. Health maintenance. Patient has appointment this week to see her primary and do routine blood work. Followup for ultrasound of the axilla otherwise annual followup exam.   Note: This document was prepared with digital dictation and possible smart phrase technology. Any transcriptional errors that result from this process are unintentional.   Anastasio Auerbach MD, 10:50 AM 09/30/2013

## 2013-09-30 NOTE — Patient Instructions (Signed)
You may obtain a copy of any labs that were done today by logging onto MyChart as outlined in the instructions provided with your AVS (after visit summary). The office will not call with normal lab results but certainly if there are any significant abnormalities then we will contact you.   Health Maintenance, Female A healthy lifestyle and preventative care can promote health and wellness.  Maintain regular health, dental, and eye exams.  Eat a healthy diet. Foods like vegetables, fruits, whole grains, low-fat dairy products, and lean protein foods contain the nutrients you need without too many calories. Decrease your intake of foods high in solid fats, added sugars, and salt. Get information about a proper diet from your caregiver, if necessary.  Regular physical exercise is one of the most important things you can do for your health. Most adults should get at least 150 minutes of moderate-intensity exercise (any activity that increases your heart rate and causes you to sweat) each week. In addition, most adults need muscle-strengthening exercises on 2 or more days a week.   Maintain a healthy weight. The body mass index (BMI) is a screening tool to identify possible weight problems. It provides an estimate of body fat based on height and weight. Your caregiver can help determine your BMI, and can help you achieve or maintain a healthy weight. For adults 20 years and older:  A BMI below 18.5 is considered underweight.  A BMI of 18.5 to 24.9 is normal.  A BMI of 25 to 29.9 is considered overweight.  A BMI of 30 and above is considered obese.  Maintain normal blood lipids and cholesterol by exercising and minimizing your intake of saturated fat. Eat a balanced diet with plenty of fruits and vegetables. Blood tests for lipids and cholesterol should begin at age 61 and be repeated every 5 years. If your lipid or cholesterol levels are high, you are over 50, or you are a high risk for heart  disease, you may need your cholesterol levels checked more frequently.Ongoing high lipid and cholesterol levels should be treated with medicines if diet and exercise are not effective.  If you smoke, find out from your caregiver how to quit. If you do not use tobacco, do not start.  Lung cancer screening is recommended for adults aged 33 80 years who are at high risk for developing lung cancer because of a history of smoking. Yearly low-dose computed tomography (CT) is recommended for people who have at least a 30-pack-year history of smoking and are a current smoker or have quit within the past 15 years. A pack year of smoking is smoking an average of 1 pack of cigarettes a day for 1 year (for example: 1 pack a day for 30 years or 2 packs a day for 15 years). Yearly screening should continue until the smoker has stopped smoking for at least 15 years. Yearly screening should also be stopped for people who develop a health problem that would prevent them from having lung cancer treatment.  If you are pregnant, do not drink alcohol. If you are breastfeeding, be very cautious about drinking alcohol. If you are not pregnant and choose to drink alcohol, do not exceed 1 drink per day. One drink is considered to be 12 ounces (355 mL) of beer, 5 ounces (148 mL) of wine, or 1.5 ounces (44 mL) of liquor.  Avoid use of street drugs. Do not share needles with anyone. Ask for help if you need support or instructions about stopping  the use of drugs.  High blood pressure causes heart disease and increases the risk of stroke. Blood pressure should be checked at least every 1 to 2 years. Ongoing high blood pressure should be treated with medicines, if weight loss and exercise are not effective.  If you are 59 to 66 years old, ask your caregiver if you should take aspirin to prevent strokes.  Diabetes screening involves taking a blood sample to check your fasting blood sugar level. This should be done once every 3  years, after age 91, if you are within normal weight and without risk factors for diabetes. Testing should be considered at a younger age or be carried out more frequently if you are overweight and have at least 1 risk factor for diabetes.  Breast cancer screening is essential preventative care for women. You should practice "breast self-awareness." This means understanding the normal appearance and feel of your breasts and may include breast self-examination. Any changes detected, no matter how small, should be reported to a caregiver. Women in their 66s and 30s should have a clinical breast exam (CBE) by a caregiver as part of a regular health exam every 1 to 3 years. After age 101, women should have a CBE every year. Starting at age 100, women should consider having a mammogram (breast X-ray) every year. Women who have a family history of breast cancer should talk to their caregiver about genetic screening. Women at a high risk of breast cancer should talk to their caregiver about having an MRI and a mammogram every year.  Breast cancer gene (BRCA)-related cancer risk assessment is recommended for women who have family members with BRCA-related cancers. BRCA-related cancers include breast, ovarian, tubal, and peritoneal cancers. Having family members with these cancers may be associated with an increased risk for harmful changes (mutations) in the breast cancer genes BRCA1 and BRCA2. Results of the assessment will determine the need for genetic counseling and BRCA1 and BRCA2 testing.  The Pap test is a screening test for cervical cancer. Women should have a Pap test starting at age 57. Between ages 25 and 35, Pap tests should be repeated every 2 years. Beginning at age 37, you should have a Pap test every 3 years as long as the past 3 Pap tests have been normal. If you had a hysterectomy for a problem that was not cancer or a condition that could lead to cancer, then you no longer need Pap tests. If you are  between ages 50 and 76, and you have had normal Pap tests going back 10 years, you no longer need Pap tests. If you have had past treatment for cervical cancer or a condition that could lead to cancer, you need Pap tests and screening for cancer for at least 20 years after your treatment. If Pap tests have been discontinued, risk factors (such as a new sexual partner) need to be reassessed to determine if screening should be resumed. Some women have medical problems that increase the chance of getting cervical cancer. In these cases, your caregiver may recommend more frequent screening and Pap tests.  The human papillomavirus (HPV) test is an additional test that may be used for cervical cancer screening. The HPV test looks for the virus that can cause the cell changes on the cervix. The cells collected during the Pap test can be tested for HPV. The HPV test could be used to screen women aged 44 years and older, and should be used in women of any age  who have unclear Pap test results. After the age of 55, women should have HPV testing at the same frequency as a Pap test.  Colorectal cancer can be detected and often prevented. Most routine colorectal cancer screening begins at the age of 44 and continues through age 20. However, your caregiver may recommend screening at an earlier age if you have risk factors for colon cancer. On a yearly basis, your caregiver may provide home test kits to check for hidden blood in the stool. Use of a small camera at the end of a tube, to directly examine the colon (sigmoidoscopy or colonoscopy), can detect the earliest forms of colorectal cancer. Talk to your caregiver about this at age 86, when routine screening begins. Direct examination of the colon should be repeated every 5 to 10 years through age 13, unless early forms of pre-cancerous polyps or small growths are found.  Hepatitis C blood testing is recommended for all people born from [birth year] through 1963 and any  individual with known risks for hepatitis C.  Practice safe sex. Use condoms and avoid high-risk sexual practices to reduce the spread of sexually transmitted infections (STIs). Sexually active women aged 36 and younger should be checked for Chlamydia, which is a common sexually transmitted infection. Older women with new or multiple partners should also be tested for Chlamydia. Testing for other STIs is recommended if you are sexually active and at increased risk.  Osteoporosis is a disease in which the bones lose minerals and strength with aging. This can result in serious bone fractures. The risk of osteoporosis can be identified using a bone density scan. Women ages 20 and over and women at risk for fractures or osteoporosis should discuss screening with their caregivers. Ask your caregiver whether you should be taking a calcium supplement or vitamin D to reduce the rate of osteoporosis.  Menopause can be associated with physical symptoms and risks. Hormone replacement therapy is available to decrease symptoms and risks. You should talk to your caregiver about whether hormone replacement therapy is right for you.  Use sunscreen. Apply sunscreen liberally and repeatedly throughout the day. You should seek shade when your shadow is shorter than you. Protect yourself by wearing long sleeves, pants, a wide-brimmed hat, and sunglasses year round, whenever you are outdoors.  Notify your caregiver of new moles or changes in moles, especially if there is a change in shape or color. Also notify your caregiver if a mole is larger than the size of a pencil eraser.  Stay current with your immunizations. Document Released: 08/08/2010 Document Revised: 05/20/2012 Document Reviewed: 08/08/2010 [facility] Patient Information 2014 [employer].

## 2013-10-01 ENCOUNTER — Telehealth: Payer: Self-pay | Admitting: *Deleted

## 2013-10-01 LAB — CYTOLOGY - PAP

## 2013-10-01 NOTE — Telephone Encounter (Signed)
Message copied by Thamas Jaegers on Wed Oct 01, 2013 11:54 AM ------      Message from: Anastasio Auerbach      Created: Tue Sep 30, 2013 10:49 AM       Schedule ultrasound of the left axilla at [facility] reference patient feels swelling in the left axilla. Physician exam normal. Rule out nonpalpable abnormalities. ------

## 2013-10-01 NOTE — Telephone Encounter (Signed)
Pt informed with the below note. 

## 2013-10-01 NOTE — Telephone Encounter (Signed)
Pt 10/02/13 @ 11:00 am order faxed, left message for pt to call.

## 2013-10-02 ENCOUNTER — Other Ambulatory Visit (INDEPENDENT_AMBULATORY_CARE_PROVIDER_SITE_OTHER): Payer: Medicare Other

## 2013-10-02 DIAGNOSIS — I10 Essential (primary) hypertension: Secondary | ICD-10-CM

## 2013-10-02 DIAGNOSIS — Z0000 Encounter for general adult medical examination without abnormal findings: Secondary | ICD-10-CM

## 2013-10-02 LAB — POCT URINALYSIS DIPSTICK
BILIRUBIN UA: NEGATIVE
Blood, UA: NEGATIVE
GLUCOSE UA: NEGATIVE
Ketones, UA: NEGATIVE
Nitrite, UA: NEGATIVE
Protein, UA: NEGATIVE
Spec Grav, UA: <=1.005
Urobilinogen, UA: 0.2
pH, UA: 5.5

## 2013-10-02 LAB — CBC WITH DIFFERENTIAL/PLATELET
BASOS ABS: 0 10*3/uL (ref 0.0–0.1)
Basophils Relative: 0.5 % (ref 0.0–3.0)
Eosinophils Absolute: 0.1 10*3/uL (ref 0.0–0.7)
Eosinophils Relative: 1.4 % (ref 0.0–5.0)
HCT: 39.7 % (ref 36.0–46.0)
HEMOGLOBIN: 13.4 g/dL (ref 12.0–15.0)
LYMPHS PCT: 33 % (ref 12.0–46.0)
Lymphs Abs: 2.7 10*3/uL (ref 0.7–4.0)
MCHC: 33.9 g/dL (ref 30.0–36.0)
MCV: 90.7 fl (ref 78.0–100.0)
MONOS PCT: 8.7 % (ref 3.0–12.0)
Monocytes Absolute: 0.7 10*3/uL (ref 0.1–1.0)
NEUTROS ABS: 4.5 10*3/uL (ref 1.4–7.7)
Neutrophils Relative %: 56.4 % (ref 43.0–77.0)
Platelets: 267 10*3/uL (ref 150.0–400.0)
RBC: 4.38 Mil/uL (ref 3.87–5.11)
RDW: 12.9 % (ref 11.5–15.5)
WBC: 8.1 10*3/uL (ref 4.0–10.5)

## 2013-10-02 LAB — LIPID PANEL
CHOL/HDL RATIO: 4
Cholesterol: 252 mg/dL — ABNORMAL HIGH (ref 0–200)
HDL: 71 mg/dL (ref >39.00)
LDL Cholesterol: 168 mg/dL — ABNORMAL HIGH (ref 0–99)
NonHDL: 181
TRIGLYCERIDES: 66 mg/dL (ref 0.0–149.0)
VLDL: 13.2 mg/dL (ref 0.0–40.0)

## 2013-10-02 LAB — BASIC METABOLIC PANEL
BUN: 21 mg/dL (ref 6–23)
CALCIUM: 9.8 mg/dL (ref 8.4–10.5)
CO2: 29 mEq/L (ref 19–32)
Chloride: 103 mEq/L (ref 96–112)
Creatinine, Ser: 0.8 mg/dL (ref 0.4–1.2)
GFR: 80.82 mL/min (ref >60.00)
Glucose, Bld: 89 mg/dL (ref 70–99)
Potassium: 4.3 mEq/L (ref 3.5–5.1)
SODIUM: 140 meq/L (ref 135–145)

## 2013-10-02 LAB — HEPATIC FUNCTION PANEL
ALK PHOS: 65 U/L (ref 39–117)
ALT: 37 U/L — ABNORMAL HIGH (ref 0–35)
AST: 34 U/L (ref 0–37)
Albumin: 4 g/dL (ref 3.5–5.2)
Bilirubin, Direct: 0.1 mg/dL (ref 0.0–0.3)
Total Bilirubin: 0.9 mg/dL (ref 0.2–1.2)
Total Protein: 7.2 g/dL (ref 6.0–8.3)

## 2013-10-02 LAB — TSH: TSH: 2.34 u[IU]/mL (ref 0.35–4.50)

## 2013-10-03 ENCOUNTER — Encounter: Payer: Self-pay | Admitting: Gynecology

## 2013-10-06 ENCOUNTER — Ambulatory Visit (INDEPENDENT_AMBULATORY_CARE_PROVIDER_SITE_OTHER): Payer: Medicare Other | Admitting: Internal Medicine

## 2013-10-06 ENCOUNTER — Encounter: Payer: Self-pay | Admitting: Internal Medicine

## 2013-10-06 DIAGNOSIS — I10 Essential (primary) hypertension: Secondary | ICD-10-CM

## 2013-10-06 DIAGNOSIS — Z0000 Encounter for general adult medical examination without abnormal findings: Secondary | ICD-10-CM

## 2013-10-06 DIAGNOSIS — M8580 Other specified disorders of bone density and structure, unspecified site: Secondary | ICD-10-CM

## 2013-10-06 DIAGNOSIS — Z23 Encounter for immunization: Secondary | ICD-10-CM

## 2013-10-06 DIAGNOSIS — M949 Disorder of cartilage, unspecified: Secondary | ICD-10-CM

## 2013-10-06 DIAGNOSIS — M1990 Unspecified osteoarthritis, unspecified site: Secondary | ICD-10-CM

## 2013-10-06 DIAGNOSIS — M899 Disorder of bone, unspecified: Secondary | ICD-10-CM

## 2013-10-06 DIAGNOSIS — M179 Osteoarthritis of knee, unspecified: Secondary | ICD-10-CM

## 2013-10-06 DIAGNOSIS — M1710 Unilateral primary osteoarthritis, unspecified knee: Secondary | ICD-10-CM

## 2013-10-06 NOTE — Progress Notes (Signed)
Pre visit review using our clinic review tool, if applicable. No additional management support is needed unless otherwise documented below in the visit note. 

## 2013-10-06 NOTE — Patient Instructions (Signed)
Cardiac Diet This diet can help prevent heart disease and stroke. Many factors influence your heart health, including eating and exercise habits. Coronary risk rises a lot with abnormal blood fat (lipid) levels. Cardiac meal planning includes limiting unhealthy fats, increasing healthy fats, and making other small dietary changes. General guidelines are as follows:  Adjust calorie intake to reach and maintain desirable body weight.  Limit total fat intake to less than 30% of total calories. Saturated fat should be less than 7% of calories.  Saturated fats are found in animal products and in some vegetable products. Saturated vegetable fats are found in coconut oil, cocoa butter, palm oil, and palm kernel oil. Read labels carefully to avoid these products as much as possible. Use butter in moderation. Choose tub margarines and oils that have 2 grams of fat or less. Good cooking oils are canola and olive oils.  Practice low-fat cooking techniques. Do not fry food. Instead, broil, bake, boil, steam, grill, roast on a rack, stir-fry, or microwave it. Other fat reducing suggestions include:  Remove the skin from poultry.  Remove all visible fat from meats.  Skim the fat off stews, soups, and gravies before serving them.  Steam vegetables in water or broth instead of sauting them in fat.  Avoid foods with trans fat (or hydrogenated oils), such as commercially fried foods and commercially baked goods. Commercial shortening and deep-frying fats will contain trans fat.  Increase intake of fruits, vegetables, whole grains, and legumes to replace foods high in fat.  Increase consumption of nuts, legumes, and seeds to at least 4 servings weekly. One serving of a legume equals  cup, and 1 serving of nuts or seeds equals  cup.  Choose whole grains more often. Have 3 servings per day (a serving is 1 ounce [oz]).  Eat 4 to 5 servings of vegetables per day. A serving of vegetables is 1 cup of raw leafy  vegetables;  cup of raw or cooked cut-up vegetables;  cup of vegetable juice.  Eat 4 to 5 servings of fruit per day. A serving of fruit is 1 medium whole fruit;  cup of dried fruit;  cup of fresh, frozen, or canned fruit;  cup of 100% fruit juice.  Increase your intake of dietary fiber to 20 to 30 grams per day. Insoluble fiber may help lower your risk of heart disease and may help curb your appetite.  Soluble fiber binds cholesterol to be removed from the blood. Foods high in soluble fiber are dried beans, citrus fruits, oats, apples, bananas, broccoli, Brussels sprouts, and eggplant.  Try to include foods fortified with plant sterols or stanols, such as yogurt, breads, juices, or margarines. Choose several fortified foods to achieve a daily intake of 2 to 3 grams of plant sterols or stanols.  Foods with omega-3 fats can help reduce your risk of heart disease. Aim to have a 3.5 oz portion of fatty fish twice per week, such as salmon, mackerel, albacore tuna, sardines, lake trout, or herring. If you wish to take a fish oil supplement, choose one that contains 1 gram of both DHA and EPA.  Limit processed meats to 2 servings (3 oz portion) weekly.  Limit the sodium in your diet to 1500 milligrams (mg) per day. If you have high blood pressure, talk to a registered dietitian about a DASH (Dietary Approaches to Stop Hypertension) eating plan.  Limit sweets and beverages with added sugar, such as soda, to no more than 5 servings per week. One  serving is:   1 tablespoon sugar.  1 tablespoon jelly or jam.   cup sorbet.  1 cup lemonade.   cup regular soda. CHOOSING FOODS Starches  Allowed: Breads: All kinds (wheat, rye, raisin, white, oatmeal, Italian, French, and English muffin bread). Low-fat rolls: English muffins, frankfurter and hamburger buns, bagels, pita bread, tortillas (not fried). Pancakes, waffles, biscuits, and muffins made with recommended oil.  Avoid: Products made with  saturated or trans fats, oils, or whole milk products. Butter rolls, cheese breads, croissants. Commercial doughnuts, muffins, sweet rolls, biscuits, waffles, pancakes, store-bought mixes. Crackers  Allowed: Low-fat crackers and snacks: Animal, graham, rye, saltine (with recommended oil, no lard), oyster, and matzo crackers. Bread sticks, melba toast, rusks, flatbread, pretzels, and light popcorn.  Avoid: High-fat crackers: cheese crackers, butter crackers, and those made with coconut, palm oil, or trans fat (hydrogenated oils). Buttered popcorn. Cereals  Allowed: Hot or cold whole-grain cereals.  Avoid: Cereals containing coconut, hydrogenated vegetable fat, or animal fat. Potatoes / Pasta / Rice  Allowed: All kinds of potatoes, rice, and pasta (such as macaroni, spaghetti, and noodles).  Avoid: Pasta or rice prepared with cream sauce or high-fat cheese. Chow mein noodles, French fries. Vegetables  Allowed: All vegetables and vegetable juices.  Avoid: Fried vegetables. Vegetables in cream, butter, or high-fat cheese sauces. Limit coconut. Fruit in cream or custard. Protein  Allowed: Limit your intake of meat, seafood, and poultry to no more than 6 oz (cooked weight) per day. All lean, well-trimmed beef, veal, pork, and lamb. All chicken and turkey without skin. All fish and shellfish. Wild game: wild duck, rabbit, pheasant, and venison. Egg whites or low-cholesterol egg substitutes may be used as desired. Meatless dishes: recipes with dried beans, peas, lentils, and tofu (soybean curd). Seeds and nuts: all seeds and most nuts.  Avoid: Prime grade and other heavily marbled and fatty meats, such as short ribs, spare ribs, rib eye roast or steak, frankfurters, sausage, bacon, and high-fat luncheon meats, mutton. Caviar. Commercially fried fish. Domestic duck, goose, venison sausage. Organ meats: liver, gizzard, heart, chitterlings, brains, kidney, sweetbreads. Dairy  Allowed: Low-fat  cheeses: nonfat or low-fat cottage cheese (1% or 2% fat), cheeses made with part skim milk, such as mozzarella, farmers, string, or ricotta. (Cheeses should be labeled no more than 2 to 6 grams fat per oz.). Skim (or 1%) milk: liquid, powdered, or evaporated. Buttermilk made with low-fat milk. Drinks made with skim or low-fat milk or cocoa. Chocolate milk or cocoa made with skim or low-fat (1%) milk. Nonfat or low-fat yogurt.  Avoid: Whole milk cheeses, including colby, cheddar, muenster, Monterey Jack, Havarti, Brie, Camembert, American, Swiss, and blue. Creamed cottage cheese, cream cheese. Whole milk and whole milk products, including buttermilk or yogurt made from whole milk, drinks made from whole milk. Condensed milk, evaporated whole milk, and 2% milk. Soups and Combination Foods  Allowed: Low-fat low-sodium soups: broth, dehydrated soups, homemade broth, soups with the fat removed, homemade cream soups made with skim or low-fat milk. Low-fat spaghetti, lasagna, chili, and Spanish rice if low-fat ingredients and low-fat cooking techniques are used.  Avoid: Cream soups made with whole milk, cream, or high-fat cheese. All other soups. Desserts and Sweets  Allowed: Sherbet, fruit ices, gelatins, meringues, and angel food cake. Homemade desserts with recommended fats, oils, and milk products. Jam, jelly, honey, marmalade, sugars, and syrups. Pure sugar candy, such as gum drops, hard candy, jelly beans, marshmallows, mints, and small amounts of dark chocolate.  Avoid: Commercially prepared   cakes, pies, cookies, frosting, pudding, or mixes for these products. Desserts containing whole milk products, chocolate, coconut, lard, palm oil, or palm kernel oil. Ice cream or ice cream drinks. Candy that contains chocolate, coconut, butter, hydrogenated fat, or unknown ingredients. Buttered syrups. Fats and Oils  Allowed: Vegetable oils: safflower, sunflower, corn, soybean, cottonseed, sesame, canola, olive,  or peanut. Non-hydrogenated margarines. Salad dressing or mayonnaise: homemade or commercial, made with a recommended oil. Low or nonfat salad dressing or mayonnaise.  Limit added fats and oils to 6 to 8 tsp per day (includes fats used in cooking, baking, salads, and spreads on bread). Remember to count the "hidden fats" in foods.  Avoid: Solid fats and shortenings: butter, lard, salt pork, bacon drippings. Gravy containing meat fat, shortening, or suet. Cocoa butter, coconut. Coconut oil, palm oil, palm kernel oil, or hydrogenated oils: these ingredients are often used in bakery products, nondairy creamers, whipped toppings, candy, and commercially fried foods. Read labels carefully. Salad dressings made of unknown oils, sour cream, or cheese, such as blue cheese and Roquefort. Cream, all kinds: half-and-half, light, heavy, or whipping. Sour cream or cream cheese (even if "light" or low-fat). Nondairy cream substitutes: coffee creamers and sour cream substitutes made with palm, palm kernel, hydrogenated oils, or coconut oil. Beverages  Allowed: Coffee (regular or decaffeinated), tea. Diet carbonated beverages, mineral water. Alcohol: Check with your caregiver. Moderation is recommended.  Avoid: Whole milk, regular sodas, and juice drinks with added sugar. Condiments  Allowed: All seasonings and condiments. Cocoa powder. "Cream" sauces made with recommended ingredients.  Avoid: Carob powder made with hydrogenated fats. SAMPLE MENU Breakfast   cup orange juice   cup oatmeal  1 slice toast  1 tsp margarine  1 cup skim milk Lunch  [place] sandwich with 2 oz [place], 2 slices bread  Lettuce and tomato slices  Fresh fruit  Carrot sticks  Coffee or tea Snack  Fresh fruit or low-fat crackers Dinner  3 oz lean ground beef  1 baked potato  1 tsp margarine   cup asparagus  Lettuce salad  1 tbs non-creamy dressing   cup peach slices  1 cup skim milk Document Released:  11/02/2007 Document Revised: 07/25/2011 Document Reviewed: 03/25/2013 [employer] Patient Information 2015 [place], [place]. This information is not intended to replace advice given to you by your health care provider. Make sure you discuss any questions you have with your health care provider.   Limit your sodium (Salt) intake  Please check your blood pressure on a regular basis.  If it is consistently greater than 150/90, please make an office appointment.    It is important that you exercise regularly, at least 20 minutes 3 to 4 times per week.  If you develop chest pain or shortness of breath seek  medical attention.

## 2013-10-06 NOTE — Progress Notes (Signed)
Subjective:    Patient ID: Robin Francis, female    DOB: 27-Aug-1945, 68 y.o.   MRN: [id number]  HPI 68 year-old patient who is seen today for a health maintenance examination.  She enjoys excellent health. She is followed by orthopedics due to osteoarthritis. She is followed by gynecology and has had a colonoscopy in 2008. No new concerns or complaints today;   laboratory studies were reviewed and these were unremarkable. She has had a gynecologic evaluation earlier.   A followup bone density studywas performed last year. She  has annual mammograms     She has hypertension which has been quite stable on combination therapy  Past medical history reviewed from the chart unremarkable except for orthopedic procedures Medications include meloxicam and a number of herbal products and vitamins Family history reviewed father died of suicide death mother died young from pancreatic cancer one brother remains well   1. Risk factors, based on past  M,S,F history-  no cardiovascular risk factors except hypertension which has been well-controlled  2.  Physical activities: Remains active without restrictions  3.  Depression/mood: No history depression or mood disorder  4.  Hearing: No deficits  5.  ADL's: Independent in all aspects of daily living  6.  Fall risk: Low  7.  Home safety: No problems identified  8.  Height weight, and visual acuity; height and weight stable no change in visual acuity  9.  Counseling: Regular exercise regimen encouraged calcium and vitamin D supplements encouraged  10. Lab orders based on risk factors: Laboratory profile reviewed  11. Referral : Followup gynecology in one year  21. Care plan: Continue active lifestyle. Home blood pressure monitoring  13. Cognitive assessment: Alert and oriented normal affect. No cognitive dysfunction  14.  Preventive services-  the patient has periodic mammograms and annual gynecologic evaluation .  That includes bone  density studies.  15- provider list update includes a GI gynecology as well as orthopedics     Review of Systems  Constitutional: Negative for fever, appetite change, fatigue and unexpected weight change.  HENT: Negative for congestion, dental problem, ear pain, hearing loss, mouth sores, nosebleeds, sinus pressure, sore throat, tinnitus, trouble swallowing and voice change.   Eyes: Negative for photophobia, pain, redness and visual disturbance.  Respiratory: Negative for cough, chest tightness and shortness of breath.   Cardiovascular: Negative for chest pain, palpitations and leg swelling.  Gastrointestinal: Negative for nausea, vomiting, abdominal pain, diarrhea, constipation, blood in stool, abdominal distention and rectal pain.  Genitourinary: Negative for dysuria, urgency, frequency, hematuria, flank pain, vaginal bleeding, vaginal discharge, difficulty urinating, genital sores, vaginal pain, menstrual problem and pelvic pain.  Musculoskeletal: Negative for arthralgias, back pain and neck stiffness.  Skin: Negative for rash.  Neurological: Negative for dizziness, syncope, speech difficulty, weakness, light-headedness, numbness and headaches.  Hematological: Negative for adenopathy. Does not bruise/bleed easily.  Psychiatric/Behavioral: Negative for suicidal ideas, behavioral problems, self-injury, dysphoric mood and agitation. The patient is not nervous/anxious.        Objective:   Physical Exam  Constitutional: She is oriented to person, place, and time. She appears well-developed and well-nourished.  HENT:  Head: Normocephalic and atraumatic.  Right Ear: External ear normal.  Left Ear: External ear normal.  Mouth/Throat: Oropharynx is clear and moist.  Eyes: Conjunctivae and EOM are normal.  Neck: Normal range of motion. Neck supple. No JVD present. No thyromegaly present.  Cardiovascular: Normal rate, regular rhythm, normal heart sounds and intact distal pulses.  No murmur  heard. Pulmonary/Chest: Effort normal and breath sounds normal. She has no wheezes. She has no rales.  Abdominal: Soft. Bowel sounds are normal. She exhibits no distension and no mass. There is no tenderness. There is no rebound and no guarding.  Musculoskeletal: Normal range of motion. She exhibits no edema and no tenderness.  Status post left TKR  Neurological: She is alert and oriented to person, place, and time. She has normal reflexes. No cranial nerve deficit. She exhibits normal muscle tone. Coordination normal.  Skin: Skin is warm and dry. No rash noted.  Psychiatric: She has a normal mood and affect. Her behavior is normal.          Assessment & Plan:   Preventive health examination   osteoarthritis Osteopenia Hypertension stable  We'll continue calcium and vitamin D supplements. We'll continue regular exercise. Efforts at  weight loss encouraged  return in one year for followup.

## 2013-10-30 ENCOUNTER — Other Ambulatory Visit: Payer: Self-pay | Admitting: Gastroenterology

## 2013-11-21 ENCOUNTER — Other Ambulatory Visit: Payer: Self-pay

## 2013-12-08 ENCOUNTER — Encounter: Payer: Self-pay | Admitting: Internal Medicine

## 2014-01-16 ENCOUNTER — Encounter: Payer: Self-pay | Admitting: Gynecology

## 2014-02-10 ENCOUNTER — Encounter: Payer: Self-pay | Admitting: Family Medicine

## 2014-02-10 ENCOUNTER — Ambulatory Visit (INDEPENDENT_AMBULATORY_CARE_PROVIDER_SITE_OTHER): Payer: Medicare Other | Admitting: Family Medicine

## 2014-02-10 VITALS — BP 136/86 | HR 86 | Temp 98.4°F | Ht <= 58 in | Wt 157.0 lb

## 2014-02-10 DIAGNOSIS — J209 Acute bronchitis, unspecified: Secondary | ICD-10-CM

## 2014-02-10 MED ORDER — HYDROCODONE-HOMATROPINE 5-1.5 MG/5ML PO SYRP: 5.0000 mL | ORAL_SOLUTION | ORAL | Status: DC | PRN

## 2014-02-10 MED ORDER — AZITHROMYCIN 250 MG PO TABS: ORAL_TABLET | ORAL | Status: DC

## 2014-02-10 NOTE — Progress Notes (Signed)
   Subjective:    Patient ID: Robin Francis, female    DOB: 11/18/1945, 69 y.o.   MRN: [id number]  HPI Here for 6 days of fever, chest tightness and coughing up green sputum.    Review of Systems  Constitutional: Positive for fever.  HENT: Positive for congestion. Negative for postnasal drip and sinus pressure.   Eyes: Negative.   Respiratory: Positive for cough and chest tightness.        Objective:   Physical Exam  Constitutional: She appears well-developed and well-nourished.  HENT:  Right Ear: External ear normal.  Left Ear: External ear normal.  Nose: Nose normal.  Mouth/Throat: Oropharynx is clear and moist.  Eyes: Conjunctivae are normal.  Pulmonary/Chest: Effort normal. No respiratory distress. She has no wheezes. She has no rales.  Scattered rhonchi   Lymphadenopathy:    She has no cervical adenopathy.          Assessment & Plan:  Add Mucinex

## 2014-02-10 NOTE — Progress Notes (Signed)
Pre visit review using our clinic review tool, if applicable. No additional management support is needed unless otherwise documented below in the visit note. 

## 2014-02-17 ENCOUNTER — Other Ambulatory Visit: Payer: Self-pay | Admitting: Gastroenterology

## 2014-02-20 ENCOUNTER — Other Ambulatory Visit: Payer: Self-pay | Admitting: Gastroenterology

## 2014-02-21 ENCOUNTER — Other Ambulatory Visit: Payer: Self-pay | Admitting: Internal Medicine

## 2014-03-25 ENCOUNTER — Other Ambulatory Visit: Payer: Self-pay | Admitting: Internal Medicine

## 2014-06-21 ENCOUNTER — Other Ambulatory Visit: Payer: Self-pay | Admitting: Gastroenterology

## 2014-07-23 LAB — HM MAMMOGRAPHY: HM Mammogram: NEGATIVE

## 2014-07-24 ENCOUNTER — Encounter: Payer: Self-pay | Admitting: Gynecology

## 2014-07-28 ENCOUNTER — Encounter: Payer: Self-pay | Admitting: Internal Medicine

## 2014-08-20 ENCOUNTER — Other Ambulatory Visit: Payer: Self-pay | Admitting: Internal Medicine

## 2014-09-22 ENCOUNTER — Other Ambulatory Visit: Payer: Self-pay | Admitting: Gastroenterology

## 2014-11-04 ENCOUNTER — Encounter: Payer: Self-pay | Admitting: Gynecology

## 2014-11-04 ENCOUNTER — Ambulatory Visit (INDEPENDENT_AMBULATORY_CARE_PROVIDER_SITE_OTHER): Payer: Medicare Other | Admitting: Gynecology

## 2014-11-04 VITALS — BP 122/76 | Ht <= 58 in | Wt 160.0 lb

## 2014-11-04 DIAGNOSIS — Z01419 Encounter for gynecological examination (general) (routine) without abnormal findings: Secondary | ICD-10-CM | POA: Diagnosis not present

## 2014-11-04 DIAGNOSIS — M8580 Other specified disorders of bone density and structure, unspecified site: Secondary | ICD-10-CM

## 2014-11-04 DIAGNOSIS — N952 Postmenopausal atrophic vaginitis: Secondary | ICD-10-CM | POA: Diagnosis not present

## 2014-11-04 NOTE — Patient Instructions (Signed)

## 2014-11-04 NOTE — Progress Notes (Signed)
Robin Francis 1945/10/17 [id number]        69 y.o.  [id number] for breast and pelvic exam.  Past medical history,surgical history, problem list, medications, allergies, family history and social history were all reviewed and documented as reviewed in the EPIC chart.  ROS:  Performed with pertinent positives and negatives included in the history, assessment and plan.   Additional significant findings :  none   Exam: Kim Counsellor Vitals:   11/04/14 1359  BP: 122/76  Height: 4\' 10"  (1.473 m)  Weight: 160 lb (72.576 kg)   General appearance:  Normal affect, orientation and appearance. Skin: Grossly normal HEENT: Without gross lesions.  No cervical or supraclavicular adenopathy. Thyroid normal.  Lungs:  Clear without wheezing, rales or rhonchi Cardiac: RR, without RMG Abdominal:  Soft, nontender, without masses, guarding, rebound, organomegaly or hernia Breasts:  Examined lying and sitting without masses, retractions, discharge or axillary adenopathy. Pelvic:  Ext/BUS/vagina with generalized atrophic changes  Cervix with atrophic changes  Uterus difficult to palpate but grossly normal size midline mobile nontender  Adnexa  Without gross masses or tenderness    Anus and perineum  Normal   Rectovaginal  Normal sphincter tone without palpated masses or tenderness.    Assessment/Plan:  69 y.o. [id number] female for breast and pelvic exam.   1. Post menopausal/atrophic genital changes. Without significant hot flashes, night sweats, vaginal dryness or any vaginal bleeding. Continue to monitor and report any issues or bleeding. 2. Osteopenia.  DEXA 2014 T score -1.2 FRAX 8%/0.7%. Repeat DEXA now and she will schedule. 3. Mammography 07/2014. Continue with annual mammography. SBE monthly reviewed. 4. Colonoscopy 2008 with planned repeat interval reported at 10 years. 5. Pap smear 2015. No Pap smear done today. No history of significant abnormal Pap smears. Options to stop screening as she  is over the age 75 versus less frequent screening intervals reviewed. Will readdress on annual basis. 6. Health maintenance. No routine lab work done as patient has this done at her primary physician's office. Follow up 1 year, sooner as needed.   Anastasio Auerbach MD, 2:17 PM 11/04/2014

## 2014-11-07 DIAGNOSIS — M8580 Other specified disorders of bone density and structure, unspecified site: Secondary | ICD-10-CM

## 2014-11-07 HISTORY — DX: Other specified disorders of bone density and structure, unspecified site: M85.80

## 2014-11-11 ENCOUNTER — Other Ambulatory Visit (INDEPENDENT_AMBULATORY_CARE_PROVIDER_SITE_OTHER): Payer: Medicare Other

## 2014-11-11 DIAGNOSIS — Z0000 Encounter for general adult medical examination without abnormal findings: Secondary | ICD-10-CM

## 2014-11-11 LAB — BASIC METABOLIC PANEL
BUN: 15 mg/dL (ref 6–23)
CHLORIDE: 101 meq/L (ref 96–112)
CO2: 31 meq/L (ref 19–32)
CREATININE: 0.77 mg/dL (ref 0.40–1.20)
Calcium: 9.8 mg/dL (ref 8.4–10.5)
GFR: 79.34 mL/min (ref >60.00)
GLUCOSE: 95 mg/dL (ref 70–99)
POTASSIUM: 3.9 meq/L (ref 3.5–5.1)
Sodium: 141 mEq/L (ref 135–145)

## 2014-11-11 LAB — CBC WITH DIFFERENTIAL/PLATELET
BASOS PCT: 0.6 % (ref 0.0–3.0)
Basophils Absolute: 0 10*3/uL (ref 0.0–0.1)
EOS ABS: 0.1 10*3/uL (ref 0.0–0.7)
EOS PCT: 1.4 % (ref 0.0–5.0)
HCT: 41.1 % (ref 36.0–46.0)
HEMOGLOBIN: 13.9 g/dL (ref 12.0–15.0)
LYMPHS ABS: 2.8 10*3/uL (ref 0.7–4.0)
Lymphocytes Relative: 32.4 % (ref 12.0–46.0)
MCHC: 33.7 g/dL (ref 30.0–36.0)
MCV: 91 fl (ref 78.0–100.0)
MONO ABS: 0.7 10*3/uL (ref 0.1–1.0)
Monocytes Relative: 7.8 % (ref 3.0–12.0)
NEUTROS ABS: 5 10*3/uL (ref 1.4–7.7)
NEUTROS PCT: 57.8 % (ref 43.0–77.0)
PLATELETS: 293 10*3/uL (ref 150.0–400.0)
RBC: 4.52 Mil/uL (ref 3.87–5.11)
RDW: 12.7 % (ref 11.5–15.5)
WBC: 8.6 10*3/uL (ref 4.0–10.5)

## 2014-11-11 LAB — POCT URINALYSIS DIPSTICK
Bilirubin, UA: NEGATIVE
Glucose, UA: NEGATIVE
KETONES UA: NEGATIVE
Nitrite, UA: NEGATIVE
PH UA: 7
PROTEIN UA: NEGATIVE
Spec Grav, UA: 1.015
UROBILINOGEN UA: 0.2

## 2014-11-11 LAB — LIPID PANEL
CHOLESTEROL: 221 mg/dL — AB (ref 0–200)
HDL: 67.7 mg/dL (ref >39.00)
LDL Cholesterol: 136 mg/dL — ABNORMAL HIGH (ref 0–99)
NONHDL: 152.95
Total CHOL/HDL Ratio: 3
Triglycerides: 84 mg/dL (ref 0.0–149.0)
VLDL: 16.8 mg/dL (ref 0.0–40.0)

## 2014-11-11 LAB — TSH: TSH: 2.54 u[IU]/mL (ref 0.35–4.50)

## 2014-11-11 LAB — HEPATIC FUNCTION PANEL
ALT: 28 U/L (ref 0–35)
AST: 23 U/L (ref 0–37)
Albumin: 4.2 g/dL (ref 3.5–5.2)
Alkaline Phosphatase: 68 U/L (ref 39–117)
BILIRUBIN DIRECT: 0.2 mg/dL (ref 0.0–0.3)
BILIRUBIN TOTAL: 0.8 mg/dL (ref 0.2–1.2)
Total Protein: 6.8 g/dL (ref 6.0–8.3)

## 2014-11-17 ENCOUNTER — Encounter: Payer: Self-pay | Admitting: Internal Medicine

## 2014-11-17 ENCOUNTER — Ambulatory Visit (INDEPENDENT_AMBULATORY_CARE_PROVIDER_SITE_OTHER): Payer: Medicare Other | Admitting: Internal Medicine

## 2014-11-17 VITALS — BP 110/70 | HR 78 | Temp 98.5°F | Resp 20 | Ht <= 58 in | Wt 161.0 lb

## 2014-11-17 DIAGNOSIS — Z0000 Encounter for general adult medical examination without abnormal findings: Secondary | ICD-10-CM | POA: Diagnosis not present

## 2014-11-17 DIAGNOSIS — M170 Bilateral primary osteoarthritis of knee: Secondary | ICD-10-CM

## 2014-11-17 DIAGNOSIS — Z23 Encounter for immunization: Secondary | ICD-10-CM | POA: Diagnosis not present

## 2014-11-17 DIAGNOSIS — I10 Essential (primary) hypertension: Secondary | ICD-10-CM

## 2014-11-17 NOTE — Progress Notes (Signed)
Subjective:    Patient ID: Robin Francis, female    DOB: 1945-03-19, 69 y.o.   MRN: [id number]  HPI 69 year-old patient who is seen today for a health maintenance examination.  She enjoys excellent health. She is followed by orthopedics due to osteoarthritis. She is followed by gynecology and has had a colonoscopy in 2008. No new concerns or complaints today;   laboratory studies were reviewed and these were unremarkable. She has had a gynecologic evaluation earlier.   A followup bone density studywas performed last year. She  has annual mammograms. Only issues, worsening right knee pain.  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  She is contemplating right total knee replacement surgery.  Presently walks with a cane    She has hypertension which has been quite stable on combination therapy  Past medical history reviewed from the chart unremarkable except for orthopedic procedures Medications include meloxicam and a number of herbal products and vitamins Family history reviewed father died of suicide death mother died young from pancreatic cancer one brother remains well   1. Risk factors, based on past  M,S,F history-  no cardiovascular risk factors except hypertension which has been well-controlled  2.  Physical activities: Remains active without restrictions  3.  Depression/mood: No history depression or mood disorder  4.  Hearing: No deficits  5.  ADL's: Independent in all aspects of daily living  6.  Fall risk: Low to moderate.  Walks with a cane due to right knee pain  7.  Home safety: No problems identified  8.  Height weight, and visual acuity; height and weight stable no change in visual acuity  9.  Counseling: Regular exercise regimen encouraged calcium and vitamin D supplements encouraged  10. Lab orders based on risk factors: Laboratory profile reviewed  11. Referral : Followup gynecology in one year  68. Care plan: Continue  active lifestyle. Home blood pressure monitoring  13. Cognitive assessment: Alert and oriented normal affect. No cognitive dysfunction  14.  Preventive services-  the patient has periodic mammograms and annual gynecologic evaluation .  That includes bone density studies.  15- provider list update includes a GI gynecology as well as orthopedics     Review of Systems  Constitutional: Negative for fever, appetite change, fatigue and unexpected weight change.  HENT: Negative for congestion, dental problem, ear pain, hearing loss, mouth sores, nosebleeds, sinus pressure, sore throat, tinnitus, trouble swallowing and voice change.   Eyes: Negative for photophobia, pain, redness and visual disturbance.  Respiratory: Negative for cough, chest tightness and shortness of breath.   Cardiovascular: Negative for chest pain, palpitations and leg swelling.  Gastrointestinal: Negative for nausea, vomiting, abdominal pain, diarrhea, constipation, blood in stool, abdominal distention and rectal pain.  Genitourinary: Negative for dysuria, urgency, frequency, hematuria, flank pain, vaginal bleeding, vaginal discharge, difficulty urinating, genital sores, vaginal pain, menstrual problem and pelvic pain.  Musculoskeletal: Negative for back pain, arthralgias and neck stiffness.  Skin: Negative for rash.  Neurological: Negative for dizziness, syncope, speech difficulty, weakness, light-headedness, numbness and headaches.  Hematological: Negative for adenopathy. Does not bruise/bleed easily.  Psychiatric/Behavioral: Negative for suicidal ideas, behavioral problems, self-injury, dysphoric mood and agitation. The patient is not nervous/anxious.        Objective:   Physical Exam  Constitutional: She is oriented to person, place, and time. She appears well-developed and well-nourished.  HENT:  Head: Normocephalic and atraumatic.  Right Ear: External ear normal.  Left Ear:  External ear normal.  Mouth/Throat:  Oropharynx is clear and moist.  Eyes: Conjunctivae and EOM are normal.  Neck: Normal range of motion. Neck supple. No JVD present. No thyromegaly present.  Cardiovascular: Normal rate, regular rhythm, normal heart sounds and intact distal pulses.   No murmur heard. Pulmonary/Chest: Effort normal and breath sounds normal. She has no wheezes. She has no rales.  Abdominal: Soft. Bowel sounds are normal. She exhibits no distension and no mass. There is no tenderness. There is no rebound and no guarding.  Musculoskeletal: Normal range of motion. She exhibits no edema or tenderness.  Status post left TKR  Neurological: She is alert and oriented to person, place, and time. She has normal reflexes. No cranial nerve deficit. She exhibits normal muscle tone. Coordination normal.  Skin: Skin is warm and dry. No rash noted.  Psychiatric: She has a normal mood and affect. Her behavior is normal.          Assessment & Plan:   Preventive health examination  Osteoarthritis Osteopenia Hypertension stable  We'll continue calcium and vitamin D supplements. We'll continue regular exercise. Efforts at  weight loss encouraged  return in one year for followup.

## 2014-11-17 NOTE — Progress Notes (Signed)
Pre visit review using our clinic review tool, if applicable. No additional management support is needed unless otherwise documented below in the visit note. 

## 2014-11-17 NOTE — Patient Instructions (Addendum)
Limit your sodium (Salt) intake  Please check your blood pressure on a regular basis.  If it is consistently greater than 150/90, please make an office appointment.  Return in one year for follow-up  Take a calcium supplement, plus [phone] units of vitamin D  Health Maintenance, Female Adopting a healthy lifestyle and getting preventive care can go a long way to promote health and wellness. Talk with your health care provider about what schedule of regular examinations is right for you. This is a good chance for you to check in with your provider about disease prevention and staying healthy. In between checkups, there are plenty of things you can do on your own. Experts have done a lot of research about which lifestyle changes and preventive measures are most likely to keep you healthy. Ask your health care provider for more information. WEIGHT AND DIET  Eat a healthy diet  Be sure to include plenty of vegetables, fruits, low-fat dairy products, and lean protein.  Do not eat a lot of foods high in solid fats, added sugars, or salt.  Get regular exercise. This is one of the most important things you can do for your health.  Most adults should exercise for at least 150 minutes each week. The exercise should increase your heart rate and make you sweat (moderate-intensity exercise).  Most adults should also do strengthening exercises at least twice a week. This is in addition to the moderate-intensity exercise.  Maintain a healthy weight  Body mass index (BMI) is a measurement that can be used to identify possible weight problems. It estimates body fat based on height and weight. Your health care provider can help determine your BMI and help you achieve or maintain a healthy weight.  For females 24 years of age and older:   A BMI below 18.5 is considered underweight.  A BMI of 18.5 to 24.9 is normal.  A BMI of 25 to 29.9 is considered overweight.  A BMI of 30 and above is considered  obese.  Watch levels of cholesterol and blood lipids  You should start having your blood tested for lipids and cholesterol at 67 years of age, then have this test every 5 years.  You may need to have your cholesterol levels checked more often if:  Your lipid or cholesterol levels are high.  You are older than 66 years of age.  You are at high risk for heart disease.  CANCER SCREENING   Lung Cancer  Lung cancer screening is recommended for adults 37-75 years old who are at high risk for lung cancer because of a history of smoking.  A yearly low-dose CT scan of the lungs is recommended for people who:  Currently smoke.  Have quit within the past 15 years.  Have at least a 30-pack-year history of smoking. A pack year is smoking an average of one pack of cigarettes a day for 1 year.  Yearly screening should continue until it has been 15 years since you quit.  Yearly screening should stop if you develop a health problem that would prevent you from having lung cancer treatment.  Breast Cancer  Practice breast self-awareness. This means understanding how your breasts normally appear and feel.  It also means doing regular breast self-exams. Let your health care provider know about any changes, no matter how small.  If you are in your 20s or 30s, you should have a clinical breast exam (CBE) by a health care provider every 1-3 years as part of  a regular health exam.  If you are 40 or older, have a CBE every year. Also consider having a breast X-ray (mammogram) every year.  If you have a family history of breast cancer, talk to your health care provider about genetic screening.  If you are at high risk for breast cancer, talk to your health care provider about having an MRI and a mammogram every year.  Breast cancer gene (BRCA) assessment is recommended for women who have family members with BRCA-related cancers. BRCA-related cancers  include:  Breast.  Ovarian.  Tubal.  Peritoneal cancers.  Results of the assessment will determine the need for genetic counseling and BRCA1 and BRCA2 testing. Cervical Cancer Your health care provider may recommend that you be screened regularly for cancer of the pelvic organs (ovaries, uterus, and vagina). This screening involves a pelvic examination, including checking for microscopic changes to the surface of your cervix (Pap test). You may be encouraged to have this screening done every 3 years, beginning at age 21.  For women ages 30-65, health care providers may recommend pelvic exams and Pap testing every 3 years, or they may recommend the Pap and pelvic exam, combined with testing for human papilloma virus (HPV), every 5 years. Some types of HPV increase your risk of cervical cancer. Testing for HPV may also be done on women of any age with unclear Pap test results.  Other health care providers may not recommend any screening for nonpregnant women who are considered low risk for pelvic cancer and who do not have symptoms. Ask your health care provider if a screening pelvic exam is right for you.  If you have had past treatment for cervical cancer or a condition that could lead to cancer, you need Pap tests and screening for cancer for at least 20 years after your treatment. If Pap tests have been discontinued, your risk factors (such as having a new sexual partner) need to be reassessed to determine if screening should resume. Some women have medical problems that increase the chance of getting cervical cancer. In these cases, your health care provider may recommend more frequent screening and Pap tests. Colorectal Cancer  This type of cancer can be detected and often prevented.  Routine colorectal cancer screening usually begins at 67 years of age and continues through 67 years of age.  Your health care provider may recommend screening at an earlier age if you have risk factors for  colon cancer.  Your health care provider may also recommend using home test kits to check for hidden blood in the stool.  A small camera at the end of a tube can be used to examine your colon directly (sigmoidoscopy or colonoscopy). This is done to check for the earliest forms of colorectal cancer.  Routine screening usually begins at age 50.  Direct examination of the colon should be repeated every 5-10 years through 67 years of age. However, you may need to be screened more often if early forms of precancerous polyps or small growths are found. Skin Cancer  Check your skin from head to toe regularly.  Tell your health care provider about any new moles or changes in moles, especially if there is a change in a mole's shape or color.  Also tell your health care provider if you have a mole that is larger than the size of a pencil eraser.  Always use sunscreen. Apply sunscreen liberally and repeatedly throughout the day.  Protect yourself by wearing long sleeves, pants, a wide-brimmed   hat, and sunglasses whenever you are outside. HEART DISEASE, DIABETES, AND HIGH BLOOD PRESSURE   High blood pressure causes heart disease and increases the risk of stroke. High blood pressure is more likely to develop in:  People who have blood pressure in the high end of the normal range (130-139/85-89 mm Hg).  People who are overweight or obese.  People who are African American.  If you are 30-44 years of age, have your blood pressure checked every 3-5 years. If you are 37 years of age or older, have your blood pressure checked every year. You should have your blood pressure measured twice--once when you are at a hospital or clinic, and once when you are not at a hospital or clinic. Record the average of the two measurements. To check your blood pressure when you are not at a hospital or clinic, you can use:  An automated blood pressure machine at a pharmacy.  A home blood pressure monitor.  If you  are between 72 years and 87 years old, ask your health care provider if you should take aspirin to prevent strokes.  Have regular diabetes screenings. This involves taking a blood sample to check your fasting blood sugar level.  If you are at a normal weight and have a low risk for diabetes, have this test once every three years after 67 years of age.  If you are overweight and have a high risk for diabetes, consider being tested at a younger age or more often. PREVENTING INFECTION  Hepatitis B  If you have a higher risk for hepatitis B, you should be screened for this virus. You are considered at high risk for hepatitis B if:  You were born in a country where hepatitis B is common. Ask your health care provider which countries are considered high risk.  Your parents were born in a high-risk country, and you have not been immunized against hepatitis B (hepatitis B vaccine).  You have HIV or AIDS.  You use needles to inject street drugs.  You live with someone who has hepatitis B.  You have had sex with someone who has hepatitis B.  You get hemodialysis treatment.  You take certain medicines for conditions, including cancer, organ transplantation, and autoimmune conditions. Hepatitis C  Blood testing is recommended for:  Everyone born from [birth year] through 1963.  Anyone with known risk factors for hepatitis C. Sexually transmitted infections (STIs)  You should be screened for sexually transmitted infections (STIs) including gonorrhea and chlamydia if:  You are sexually active and are younger than 67 years of age.  You are older than 67 years of age and your health care provider tells you that you are at risk for this type of infection.  Your sexual activity has changed since you were last screened and you are at an increased risk for chlamydia or gonorrhea. Ask your health care provider if you are at risk.  If you do not have HIV, but are at risk, it may be recommended that you  take a prescription medicine daily to prevent HIV infection. This is called pre-exposure prophylaxis (PrEP). You are considered at risk if:  You are sexually active and do not regularly use condoms or know the HIV status of your partner(s).  You take drugs by injection.  You are sexually active with a partner who has HIV. Talk with your health care provider about whether you are at high risk of being infected with HIV. If you choose to begin PrEP, you  should first be tested for HIV. You should then be tested every 3 months for as long as you are taking PrEP.  PREGNANCY   If you are premenopausal and you may become pregnant, ask your health care provider about preconception counseling.  If you may become pregnant, take 400 to 800 micrograms (mcg) of folic acid every day.  If you want to prevent pregnancy, talk to your health care provider about birth control (contraception). OSTEOPOROSIS AND MENOPAUSE   Osteoporosis is a disease in which the bones lose minerals and strength with aging. This can result in serious bone fractures. Your risk for osteoporosis can be identified using a bone density scan.  If you are 65 years of age or older, or if you are at risk for osteoporosis and fractures, ask your health care provider if you should be screened.  Ask your health care provider whether you should take a calcium or vitamin D supplement to lower your risk for osteoporosis.  Menopause may have certain physical symptoms and risks.  Hormone replacement therapy may reduce some of these symptoms and risks. Talk to your health care provider about whether hormone replacement therapy is right for you.  HOME CARE INSTRUCTIONS   Schedule regular health, dental, and eye exams.  Stay current with your immunizations.   Do not use any tobacco products including cigarettes, chewing tobacco, or electronic cigarettes.  If you are pregnant, do not drink alcohol.  If you are breastfeeding, limit how  much and how often you drink alcohol.  Limit alcohol intake to no more than 1 drink per day for nonpregnant women. One drink equals 12 ounces of beer, 5 ounces of wine, or 1 ounces of hard liquor.  Do not use street drugs.  Do not share needles.  Ask your health care provider for help if you need support or information about quitting drugs.  Tell your health care provider if you often feel depressed.  Tell your health care provider if you have ever been abused or do not feel safe at home.   This information is not intended to replace advice given to you by your health care provider. Make sure you discuss any questions you have with your health care provider.   Document Released: 08/08/2010 Document Revised: 02/13/2014 Document Reviewed: 12/25/2012 Elsevier Interactive Patient Education 2016 [employer]  

## 2014-11-26 ENCOUNTER — Other Ambulatory Visit: Payer: Self-pay | Admitting: Gynecology

## 2014-11-26 ENCOUNTER — Ambulatory Visit (INDEPENDENT_AMBULATORY_CARE_PROVIDER_SITE_OTHER): Payer: Medicare Other

## 2014-11-26 DIAGNOSIS — M8589 Other specified disorders of bone density and structure, multiple sites: Secondary | ICD-10-CM

## 2014-11-26 DIAGNOSIS — M8580 Other specified disorders of bone density and structure, unspecified site: Secondary | ICD-10-CM

## 2014-11-26 DIAGNOSIS — M899 Disorder of bone, unspecified: Secondary | ICD-10-CM

## 2014-11-27 ENCOUNTER — Encounter: Payer: Self-pay | Admitting: Gynecology

## 2014-12-12 ENCOUNTER — Other Ambulatory Visit: Payer: Self-pay | Admitting: Orthopedic Surgery

## 2014-12-12 DIAGNOSIS — M25561 Pain in right knee: Secondary | ICD-10-CM

## 2015-01-28 ENCOUNTER — Other Ambulatory Visit: Payer: Self-pay | Admitting: Internal Medicine

## 2015-02-12 ENCOUNTER — Encounter: Payer: Self-pay | Admitting: Internal Medicine

## 2015-02-12 ENCOUNTER — Ambulatory Visit (INDEPENDENT_AMBULATORY_CARE_PROVIDER_SITE_OTHER): Payer: Medicare Other | Admitting: Internal Medicine

## 2015-02-12 VITALS — BP 136/90 | HR 81 | Temp 98.0°F | Resp 20 | Ht <= 58 in | Wt 162.0 lb

## 2015-02-12 DIAGNOSIS — M150 Primary generalized (osteo)arthritis: Secondary | ICD-10-CM | POA: Diagnosis not present

## 2015-02-12 DIAGNOSIS — M159 Polyosteoarthritis, unspecified: Secondary | ICD-10-CM

## 2015-02-12 DIAGNOSIS — I10 Essential (primary) hypertension: Secondary | ICD-10-CM

## 2015-02-12 NOTE — Progress Notes (Signed)
Subjective:    Patient ID: Robin Francis, female    DOB: March 01, 1945, 70 y.o.   MRN: [id number]  HPI   70 year old patient who has treated hypertension.  She is seen today for a preop evaluation prior to anticipated right knee surgery in March. She is status post left total knee replacement surgery  Approximately 4 years ago .  She continues to do well except for some right knee pain.  This does interfere with activities.  She no longer uses a cane which was not very helpful.  She also has some mild left knee pain that she feels is related to altered biomechanics.  Remains stable clinically.  Activity is somewhat limited due to knee pain, but no exertional chest pain or dyspnea on exertion. She does get to a health club periodically without exercise limitations. She is a lifelong nonsmoker  Past Medical History  Diagnosis Date  . OSTEOARTHRITIS 09/17/2008  . Osteopenia 11/2014    T score -1.4 FRAX 8.5% / 0.8%  . GERD (gastroesophageal reflux disease)   . Hypertension     Social History   Social History  . Marital Status: Married    Spouse Name: N/A  . Number of Children: N/A  . Years of Education: N/A   Occupational History  . Not on file.   Social History Main Topics  . Smoking status: Never Smoker   . Smokeless tobacco: Never Used  . Alcohol Use: 2.4 oz/week    4 Standard drinks or equivalent per week     Comment: occassionally-social  . Drug Use: No  . Sexual Activity: Not Currently    Birth Control/ Protection: Post-menopausal     Comment: 1st intercourse 70 yo-Fewer than 5 partners   Other Topics Concern  . Not on file   Social History Narrative    Past Surgical History  Procedure Laterality Date  . Knee arthroscopy      left  . Back surgery    . Trigger finger release    . Tonsillectomy      as child  . Total knee arthroplasty  04/03/2011    Procedure: TOTAL KNEE ARTHROPLASTY;  Surgeon: Gearlean Alf, MD;  Location: [facility];  Service: Orthopedics;   Laterality: Left;    Family History  Problem Relation Age of Onset  . Pancreatic cancer Mother   . Breast cancer Maternal Grandmother     Age 33    Allergies  Allergen Reactions  . Lisinopril     Cough    Current Outpatient Prescriptions on File Prior to Visit  Medication Sig Dispense Refill  . Ascorbic Acid (VITAMIN C PO) Take by mouth.    Marland Kitchen b complex vitamins capsule Take 1 capsule by mouth daily.    . Calcium Carbonate-Vitamin D (CALCIUM + D PO) Take by mouth.    . cetirizine (ZYRTEC) 10 MG tablet Take 10 mg by mouth daily.    Marland Kitchen CINNAMON PO Take 1,000 mg by mouth daily.    . Coenzyme Q10 (CO Q-10) 120 MG CAPS Take by mouth.    . diclofenac sodium (VOLTAREN) 1 % GEL Apply 2 g topically 2 (two) times daily.    . fish oil-omega-3 fatty acids 1000 MG capsule Take 1 g by mouth daily.    Marland Kitchen losartan-hydrochlorothiazide (HYZAAR) 100-25 MG tablet TAKE 1 TABLET BY MOUTH EVERY DAY 90 tablet 1  . Multiple Vitamin (MULTIVITAMIN) tablet Take 1 tablet by mouth daily.    . pantoprazole (PROTONIX) 40 MG tablet  TAKE 1 TABLET BY MOUTH EVERY DAY 30 tablet 3  . Red Yeast Rice 600 MG CAPS Take 1,800 mg by mouth daily.    . TURMERIC PO Take by mouth.    Marland Kitchen VITAMIN E PO Take by mouth.     No current facility-administered medications on file prior to visit.    BP 136/90 mmHg  Pulse 81  Temp(Src) 98 F (36.7 C) (Oral)  Resp 20  Ht 4' 8.75" (1.441 m)  Wt 162 lb (73.483 kg)  BMI 35.39 kg/m2  SpO2 98%     Review of Systems  Constitutional: Negative.   HENT: Negative for congestion, dental problem, hearing loss, rhinorrhea, sinus pressure, sore throat and tinnitus.   Eyes: Negative for pain, discharge and visual disturbance.  Respiratory: Negative for cough and shortness of breath.   Cardiovascular: Negative for chest pain, palpitations and leg swelling.  Gastrointestinal: Negative for nausea, vomiting, abdominal pain, diarrhea, constipation, blood in stool and abdominal distention.    Genitourinary: Negative for dysuria, urgency, frequency, hematuria, flank pain, vaginal bleeding, vaginal discharge, difficulty urinating, vaginal pain and pelvic pain.  Musculoskeletal: Negative for joint swelling, arthralgias and gait problem.       Right knee pain  Skin: Negative for rash.  Neurological: Negative for dizziness, syncope, speech difficulty, weakness, numbness and headaches.  Hematological: Negative for adenopathy.  Psychiatric/Behavioral: Negative for behavioral problems, dysphoric mood and agitation. The patient is not nervous/anxious.        Objective:   Physical Exam  Constitutional: She is oriented to person, place, and time. She appears well-developed and well-nourished.  Weight 162 Blood pressure 130/84  HENT:  Head: Normocephalic.  Right Ear: External ear normal.  Left Ear: External ear normal.  Mouth/Throat: Oropharynx is clear and moist.  Eyes: Conjunctivae and EOM are normal. Pupils are equal, round, and reactive to light.  Neck: Normal range of motion. Neck supple. No thyromegaly present.  Cardiovascular: Normal rate, regular rhythm, normal heart sounds and intact distal pulses.   Pulmonary/Chest: Effort normal and breath sounds normal.  Abdominal: Soft. Bowel sounds are normal. She exhibits no mass. There is no tenderness.  Musculoskeletal: Normal range of motion.  Lymphadenopathy:    She has no cervical adenopathy.  Neurological: She is alert and oriented to person, place, and time.  Skin: Skin is warm and dry. No rash noted.  Psychiatric: She has a normal mood and affect. Her behavior is normal.          Assessment & Plan:   Hypertension.  Well-controlled Endstage osteoarthritis, right knee  No change in medical therapy No contraindications to the anticipated orthopedic surgery Low risk No further preoperative evaluation indicated

## 2015-02-12 NOTE — Patient Instructions (Signed)
Limit your sodium (Salt) intake  Please check your blood pressure on a regular basis.  If it is consistently greater than 140/90, please make an office appointment.    It is important that you exercise regularly, at least 20 minutes 3 to 4 times per week.  If you develop chest pain or shortness of breath seek  medical attention.  Report any clinical change prior to anticipated orthopedic surgery in March

## 2015-02-12 NOTE — Progress Notes (Signed)
Pre visit review using our clinic review tool, if applicable. No additional management support is needed unless otherwise documented below in the visit note. 

## 2015-02-22 ENCOUNTER — Ambulatory Visit
Admission: RE | Admit: 2015-02-22 | Discharge: 2015-02-22 | Disposition: A | Discharge status: Home / Self Care | Payer: Medicare Other | Source: Ambulatory Visit | Attending: Orthopedic Surgery | Admitting: Orthopedic Surgery

## 2015-02-22 DIAGNOSIS — M25561 Pain in right knee: Secondary | ICD-10-CM

## 2015-02-25 ENCOUNTER — Telehealth: Payer: Self-pay

## 2015-02-25 MED ORDER — PANTOPRAZOLE SODIUM 40 MG PO TBEC: 40.0000 mg | DELAYED_RELEASE_TABLET | Freq: Every day | ORAL | Status: DC

## 2015-02-25 NOTE — Telephone Encounter (Signed)
Rx has been filled as instructed by Dr Havery Moros.

## 2015-02-25 NOTE — Telephone Encounter (Signed)
Yes that's fine. You can give her 90 pills with 2 refills. Renal function normal.Thanks

## 2015-02-25 NOTE — Telephone Encounter (Signed)
Incoming fax request for Pantoprazole 40mg  1 po daily. Pt. is to establish care with Dr Loletha Carrow on 04-06-2015 can pt have refills on medication until follow up? Please advise.

## 2015-03-13 ENCOUNTER — Other Ambulatory Visit: Payer: Self-pay | Admitting: Gastroenterology

## 2015-03-15 ENCOUNTER — Ambulatory Visit: Payer: Self-pay | Admitting: Orthopedic Surgery

## 2015-03-15 NOTE — Progress Notes (Signed)
Preoperative surgical orders have been place into the Epic hospital system for Robin Francis on 03/15/2015, 11:25 AM  by Mickel Crow for surgery on 04-19-2015.  Preop Uni Knee orders including Experal, IV Tylenol, and IV Decadron as long as there are no contraindications to the above medications. Arlee Muslim, PA-C

## 2015-04-06 ENCOUNTER — Ambulatory Visit (INDEPENDENT_AMBULATORY_CARE_PROVIDER_SITE_OTHER): Payer: Medicare Other | Admitting: Gastroenterology

## 2015-04-06 ENCOUNTER — Encounter: Payer: Self-pay | Admitting: Gastroenterology

## 2015-04-06 VITALS — BP 110/80 | HR 80 | Ht <= 58 in | Wt 163.4 lb

## 2015-04-06 DIAGNOSIS — K219 Gastro-esophageal reflux disease without esophagitis: Secondary | ICD-10-CM | POA: Diagnosis not present

## 2015-04-06 DIAGNOSIS — Q394 Esophageal web: Secondary | ICD-10-CM | POA: Diagnosis not present

## 2015-04-06 DIAGNOSIS — K222 Esophageal obstruction: Secondary | ICD-10-CM

## 2015-04-06 NOTE — Progress Notes (Signed)
[facility] Progress Note  Chief Complaint: GERD  Subjective History:  EGD with Deatra Ina 11/2012 Venia Minks dil #52 of Schatzki ring and incidental shallow DU (was on NSAIDs) rare dysphagia - maybe meat or bread. Has been on PPI since then, rare pyrosis Last colonoscopy 10/2006 - due 10/2016  ROS: Cardiovascular:  no chest pain Respiratory: no dyspnea  Past Medical History: Past Medical History  Diagnosis Date  . OSTEOARTHRITIS 09/17/2008  . Osteopenia 11/2014    T score -1.4 FRAX 8.5% / 0.8%  . GERD (gastroesophageal reflux disease)   . Hypertension     Objective:  Med list reviewed  Vital signs in last 24 hrs: Filed Vitals:   04/06/15 1331  BP: 110/80  Pulse: 80    Physical Exam   HEENT: sclera anicteric, oral mucosa moist without lesions  Neck: supple, no thyromegaly, JVD or lymphadenopathy  Cardiac: RRR without murmurs, S1S2 heard, no peripheral edema  Pulm: clear to auscultation bilaterally, normal RR and effort noted  Abdomen: overweight, soft, no tenderness, with active bowel sounds. No guarding or palpable hepatosplenomegaly  Skin; warm and dry, no jaundice or rash    @ASSESSMENTPLANBEGIN @ Assessment: Encounter Diagnoses  Name Primary?  . Gastroesophageal reflux disease without esophagitis Yes  . Schatzki's ring      Feels good.  Goal is to get of ppi (she has already started to wean with QOD)  Plan:  See prn Colonoscopy 10/2016  Nelida Meuse III

## 2015-04-06 NOTE — Patient Instructions (Addendum)
See [place] in the fall of 2018 for your next colonoscopy.  Stop the protonix as we discused.  Thank you for choosing [facility]  Dr Wilfrid Lund III

## 2015-04-09 ENCOUNTER — Other Ambulatory Visit (HOSPITAL_COMMUNITY): Payer: Self-pay | Admitting: *Deleted

## 2015-04-09 NOTE — Patient Instructions (Addendum)
Robin Francis  04/09/2015   Your procedure is scheduled on: 04-19-15  Report to [facility] Main  Entrance take [facility]  elevators to 3rd floor to  [place] at 600 AM.  Call this number if you have problems the morning of surgery [phone]   Remember: ONLY 1 PERSON MAY GO WITH YOU TO SHORT STAY TO GET  READY MORNING OF [name]  Do not eat food or drink liquids :After Midnight.     Take these medicines the morning of surgery with A SIP OF WATER: CERTRIZINE (ZYRTEC), PANTAPRAZOLE (PROTONIX)                               You may not have any metal on your body including hair pins and              piercings  Do not wear jewelry, make-up, lotions, powders or perfumes, deodorant             Do not wear nail polish.  Do not shave  48 hours prior to surgery.              Men may shave face and neck.   Do not bring valuables to the hospital. [place].  Contacts, dentures or bridgework may not be worn into surgery.  Leave suitcase in the car. After surgery it may be brought to your room.     Patients discharged the day of surgery will not be allowed to drive home.  Name and phone number of your driver:  Special Instructions: N/A              Please read over the following fact sheets you were given: _____________________________________________________________________             [facility] - Preparing for Surgery Before surgery, you can play an important role.  Because skin is not sterile, your skin needs to be as free of germs as possible.  You can reduce the number of germs on your skin by washing with CHG (chlorahexidine gluconate) soap before surgery.  CHG is an antiseptic cleaner which kills germs and bonds with the skin to continue killing germs even after washing. Please DO NOT use if you have an allergy to CHG or antibacterial soaps.  If your skin becomes reddened/irritated stop using the CHG  and inform your nurse when you arrive at Short Stay. Do not shave (including legs and underarms) for at least 48 hours prior to the first CHG shower.  You may shave your face/neck. Please follow these instructions carefully:  1.  Shower with CHG Soap the night before surgery and the  morning of Surgery.  2.  If you choose to wash your hair, wash your hair first as usual with your  normal  shampoo.  3.  After you shampoo, rinse your hair and body thoroughly to remove the  shampoo.                           4.  Use CHG as you would any other liquid soap.  You can apply chg directly  to the skin and wash  Gently with a scrungie or clean washcloth.  5.  Apply the CHG Soap to your body ONLY FROM THE NECK DOWN.   Do not use on face/ open                           Wound or open sores. Avoid contact with eyes, ears mouth and genitals (private parts).                       Wash face,  Genitals (private parts) with your normal soap.             6.  Wash thoroughly, paying special attention to the area where your surgery  will be performed.  7.  Thoroughly rinse your body with warm water from the neck down.  8.  DO NOT shower/wash with your normal soap after using and rinsing off  the CHG Soap.                9.  Pat yourself dry with a clean towel.            10.  Wear clean pajamas.            11.  Place clean sheets on your bed the night of your first shower and do not  sleep with pets. Day of Surgery : Do not apply any lotions/deodorants the morning of surgery.  Please wear clean clothes to the hospital/surgery center.  FAILURE TO FOLLOW THESE INSTRUCTIONS MAY RESULT IN THE CANCELLATION OF YOUR SURGERY PATIENT SIGNATURE_________________________________  NURSE SIGNATURE__________________________________  ________________________________________________________________________   Robin Francis  An incentive spirometer is a tool that can help keep your lungs clear and  active. This tool measures how well you are filling your lungs with each breath. Taking long deep breaths may help reverse or decrease the chance of developing breathing (pulmonary) problems (especially infection) following:  A long period of time when you are unable to move or be active. BEFORE THE PROCEDURE   If the spirometer includes an indicator to show your best effort, your nurse or respiratory therapist will set it to a desired goal.  If possible, sit up straight or lean slightly forward. Try not to slouch.  Hold the incentive spirometer in an upright position. INSTRUCTIONS FOR USE   Sit on the edge of your bed if possible, or sit up as far as you can in bed or on a chair.  Hold the incentive spirometer in an upright position.  Breathe out normally.  Place the mouthpiece in your mouth and seal your lips tightly around it.  Breathe in slowly and as deeply as possible, raising the piston or the ball toward the top of the column.  Hold your breath for 3-5 seconds or for as long as possible. Allow the piston or ball to fall to the bottom of the column.  Remove the mouthpiece from your mouth and breathe out normally.  Rest for a few seconds and repeat Steps 1 through 7 at least 10 times every 1-2 hours when you are awake. Take your time and take a few normal breaths between deep breaths.  The spirometer may include an indicator to show your best effort. Use the indicator as a goal to work toward during each repetition.  After each set of 10 deep breaths, practice coughing to be sure your lungs are clear. If you have an incision (the cut made at the time of surgery),  support your incision when coughing by placing a pillow or rolled up towels firmly against it. Once you are able to get out of bed, walk around indoors and cough well. You may stop using the incentive spirometer when instructed by your caregiver.  RISKS AND COMPLICATIONS  Take your time so you do not get dizzy or  light-headed.  If you are in pain, you may need to take or ask for pain medication before doing incentive spirometry. It is harder to take a deep breath if you are having pain. AFTER USE  Rest and breathe slowly and easily.  It can be helpful to keep track of a log of your progress. Your caregiver can provide you with a simple table to help with this. If you are using the spirometer at home, follow these instructions: [place] IF:   You are having difficultly using the spirometer.  You have trouble using the spirometer as often as instructed.  Your pain medication is not giving enough relief while using the spirometer.  You develop fever of 100.5 F (38.1 C) or higher. SEEK IMMEDIATE MEDICAL CARE IF:   You cough up bloody sputum that had not been present before.  You develop fever of 102 F (38.9 C) or greater.  You develop worsening pain at or near the incision site. MAKE SURE YOU:   Understand these instructions.  Will watch your condition.  Will get help right away if you are not doing well or get worse. Document Released: 06/05/2006 Document Revised: 04/17/2011 Document Reviewed: 08/06/2006 Exit[employer] Patient Information 2014 [employer], [place].   ________________________________________________________________________  WHAT IS A BLOOD TRANSFUSION? Blood Transfusion Information  A transfusion is the replacement of blood or some of its parts. Blood is made up of multiple cells which provide different functions.  Red blood cells carry oxygen and are used for blood loss replacement.  White blood cells fight against infection.  Platelets control bleeding.  Plasma helps clot blood.  Other blood products are available for specialized needs, such as hemophilia or other clotting disorders. BEFORE THE TRANSFUSION  Who gives blood for transfusions?   Healthy volunteers who are fully evaluated to make sure their blood is safe. This is blood bank  blood. Transfusion therapy is the safest it has ever been in the practice of medicine. Before blood is taken from a donor, a complete history is taken to make sure that person has no history of diseases nor engages in risky social behavior (examples are intravenous drug use or sexual activity with multiple partners). The donor's travel history is screened to minimize risk of transmitting infections, such as malaria. The donated blood is tested for signs of infectious diseases, such as HIV and hepatitis. The blood is then tested to be sure it is compatible with you in order to minimize the chance of a transfusion reaction. If you or a relative donates blood, this is often done in anticipation of surgery and is not appropriate for emergency situations. It takes many days to process the donated blood. RISKS AND COMPLICATIONS Although transfusion therapy is very safe and saves many lives, the main dangers of transfusion include:   Getting an infectious disease.  Developing a transfusion reaction. This is an allergic reaction to something in the blood you were given. Every precaution is taken to prevent this. The decision to have a blood transfusion has been considered carefully by your caregiver before blood is given. Blood is not given unless the benefits outweigh the risks. AFTER THE TRANSFUSION  Right after receiving a blood transfusion, you will usually feel much better and more energetic. This is especially true if your red blood cells have gotten low (anemic). The transfusion raises the level of the red blood cells which carry oxygen, and this usually causes an energy increase.  The nurse administering the transfusion will monitor you carefully for complications. HOME CARE INSTRUCTIONS  No special instructions are needed after a transfusion. You may find your energy is better. Speak with your caregiver about any limitations on activity for underlying diseases you may have. SEEK MEDICAL CARE IF:    Your condition is not improving after your transfusion.  You develop redness or irritation at the intravenous (IV) site. SEEK IMMEDIATE MEDICAL CARE IF:  Any of the following symptoms occur over the next 12 hours:  Shaking chills.  You have a temperature by mouth above 102 F (38.9 C), not controlled by medicine.  Chest, back, or muscle pain.  People around you feel you are not acting correctly or are confused.  Shortness of breath or difficulty breathing.  Dizziness and fainting.  You get a rash or develop hives.  You have a decrease in urine output.  Your urine turns a dark color or changes to pink, red, or brown. Any of the following symptoms occur over the next 10 days:  You have a temperature by mouth above 102 F (38.9 C), not controlled by medicine.  Shortness of breath.  Weakness after normal activity.  The white part of the eye turns yellow (jaundice).  You have a decrease in the amount of urine or are urinating less often.  Your urine turns a dark color or changes to pink, red, or brown. Document Released: 01/21/2000 Document Revised: 04/17/2011 Document Reviewed: 09/09/2007 [facility] Patient Information 2014 [place], [place].  _______________________________________________________________________

## 2015-04-13 ENCOUNTER — Encounter (HOSPITAL_COMMUNITY): Payer: Self-pay

## 2015-04-13 ENCOUNTER — Other Ambulatory Visit: Payer: Self-pay

## 2015-04-13 ENCOUNTER — Encounter (HOSPITAL_COMMUNITY)
Admission: RE | Admit: 2015-04-13 | Discharge: 2015-04-13 | Disposition: A | Discharge status: Home / Self Care | Payer: Medicare Other | Source: Ambulatory Visit | Attending: Orthopedic Surgery | Admitting: Orthopedic Surgery

## 2015-04-13 DIAGNOSIS — Z0183 Encounter for blood typing: Secondary | ICD-10-CM | POA: Diagnosis not present

## 2015-04-13 DIAGNOSIS — M1711 Unilateral primary osteoarthritis, right knee: Secondary | ICD-10-CM | POA: Insufficient documentation

## 2015-04-13 DIAGNOSIS — Z01818 Encounter for other preprocedural examination: Secondary | ICD-10-CM | POA: Insufficient documentation

## 2015-04-13 DIAGNOSIS — Z01812 Encounter for preprocedural laboratory examination: Secondary | ICD-10-CM | POA: Insufficient documentation

## 2015-04-13 LAB — COMPREHENSIVE METABOLIC PANEL
ALT: 29 U/L (ref 14–54)
AST: 26 U/L (ref 15–41)
Albumin: 4.4 g/dL (ref 3.5–5.0)
Alkaline Phosphatase: 67 U/L (ref 38–126)
Anion gap: 13 (ref 5–15)
BILIRUBIN TOTAL: 1.1 mg/dL (ref 0.3–1.2)
BUN: 18 mg/dL (ref 6–20)
CHLORIDE: 98 mmol/L — AB (ref 101–111)
CO2: 29 mmol/L (ref 22–32)
CREATININE: 0.85 mg/dL (ref 0.44–1.00)
Calcium: 10.6 mg/dL — ABNORMAL HIGH (ref 8.9–10.3)
Glucose, Bld: 120 mg/dL — ABNORMAL HIGH (ref 65–99)
POTASSIUM: 4.5 mmol/L (ref 3.5–5.1)
Sodium: 140 mmol/L (ref 135–145)
TOTAL PROTEIN: 7.4 g/dL (ref 6.5–8.1)

## 2015-04-13 LAB — CBC
HEMATOCRIT: 38.5 % (ref 36.0–46.0)
Hemoglobin: 13 g/dL (ref 12.0–15.0)
MCH: 30.6 pg (ref 26.0–34.0)
MCHC: 33.8 g/dL (ref 30.0–36.0)
MCV: 90.6 fL (ref 78.0–100.0)
PLATELETS: 344 10*3/uL (ref 150–400)
RBC: 4.25 MIL/uL (ref 3.87–5.11)
RDW: 12.6 % (ref 11.5–15.5)
WBC: 6.7 10*3/uL (ref 4.0–10.5)

## 2015-04-13 LAB — APTT: aPTT: 38 seconds — ABNORMAL HIGH (ref 24–37)

## 2015-04-13 LAB — URINALYSIS, ROUTINE W REFLEX MICROSCOPIC
Bilirubin Urine: NEGATIVE
GLUCOSE, UA: NEGATIVE mg/dL
Hgb urine dipstick: NEGATIVE
Ketones, ur: NEGATIVE mg/dL
LEUKOCYTES UA: NEGATIVE
Nitrite: NEGATIVE
PH: 7.5 (ref 5.0–8.0)
Protein, ur: NEGATIVE mg/dL
Specific Gravity, Urine: 1.01 (ref 1.005–1.030)

## 2015-04-13 LAB — PROTIME-INR
INR: 1.16 (ref 0.00–1.49)
PROTHROMBIN TIME: 14.5 s (ref 11.6–15.2)

## 2015-04-13 LAB — SURGICAL PCR SCREEN
MRSA, PCR: NEGATIVE
Staphylococcus aureus: NEGATIVE

## 2015-04-14 NOTE — H&P (Signed)
TOTAL KNEE ADMISSION H&P  Patient is being admitted for right medial unicompartmental knee arthroplasty.  Subjective:  Chief Complaint:right knee pain.  HPI: Robin Francis, 70 y.o. female, has a history of pain and functional disability in the right knee due to arthritis and has failed non-surgical conservative treatments for greater than 12 weeks to includeNSAID's and/or analgesics, corticosteriod injections, viscosupplementation injections and weight reduction as appropriate.  Onset of symptoms was gradual, starting >10 years ago with gradually worsening course since that time. The patient noted prior procedures on the knee to include  arthroscopy and menisectomy on the right knee(s).  Patient currently rates pain in the right knee(s) at 8 out of 10 with activity. Patient has night pain, worsening of pain with activity and weight bearing, pain that interferes with activities of daily living, pain with passive range of motion, crepitus and joint swelling.  Patient has evidence of periarticular osteophytes and joint space narrowing by imaging studies. There is no active infection.  Patient Active Problem List   Diagnosis Date Noted  . Stricture and stenosis of esophagus 01/06/2013  . Special screening for malignant neoplasms, colon 11/07/2012  . Essential hypertension, benign 04/04/2012  . Low bone mass   . Postop Hyponatremia 04/13/2011  . Postop Acute blood loss anemia 04/13/2011  . OA (osteoarthritis) of knee 04/03/2011  . MENOPAUSAL SYNDROME 09/17/2008  . Osteoarthritis 09/17/2008   Past Medical History  Diagnosis Date  . OSTEOARTHRITIS 09/17/2008  . Osteopenia 11/2014    T score -1.4 FRAX 8.5% / 0.8%  . GERD (gastroesophageal reflux disease)   . Hypertension     Past Surgical History  Procedure Laterality Date  . Knee arthroscopy      left  . Trigger finger release Left   . Tonsillectomy      as child  . Total knee arthroplasty  04/03/2011    Procedure: TOTAL KNEE  ARTHROPLASTY;  Surgeon: Gearlean Alf, MD;  Location: [facility] ORS;  Service: Orthopedics;  Laterality: Left;  . Back surgery      LOWER      Current outpatient prescriptions:  .  acetaminophen (TYLENOL) 650 MG CR tablet, Take 1,300 mg by mouth every morning., Disp: , Rfl:  .  ALOE VERA PO, Take 1 capsule by mouth daily., Disp: , Rfl:  .  Ascorbic Acid (VITAMIN C PO), Take by mouth., Disp: , Rfl:  .  b complex vitamins capsule, Take 1 capsule by mouth daily., Disp: , Rfl:  .  Calcium Carbonate-Vitamin D (CALCIUM + D PO), Take 1 tablet by mouth daily. , Disp: , Rfl:  .  cetirizine (ZYRTEC) 10 MG tablet, Take 10 mg by mouth daily., Disp: , Rfl:  .  CINNAMON PO, Take 1,000 mg by mouth daily., Disp: , Rfl:  .  Coenzyme Q10 (CO Q-10) 120 MG CAPS, Take 1 tablet by mouth daily. , Disp: , Rfl:  .  diclofenac sodium (VOLTAREN) 1 % GEL, Apply 2 g topically 2 (two) times daily. knee, Disp: , Rfl:  .  fish oil-omega-3 fatty acids 1000 MG capsule, Take 1 g by mouth daily., Disp: , Rfl:  .  losartan-hydrochlorothiazide (HYZAAR) 100-25 MG tablet, TAKE 1 TABLET BY MOUTH EVERY DAY, Disp: 90 tablet, Rfl: 1 .  Multiple Vitamin (MULTIVITAMIN) tablet, Take 1 tablet by mouth daily., Disp: , Rfl:  .  pantoprazole (PROTONIX) 40 MG tablet, Take 1 tablet (40 mg total) by mouth daily., Disp: 90 tablet, Rfl: 3 .  Red Yeast Rice 600 MG CAPS,  Take 1,800 mg by mouth daily., Disp: , Rfl:  .  TURMERIC PO, Take 1 capsule by mouth daily. , Disp: , Rfl:  .  VITAMIN E PO, Take 1 capsule by mouth daily. , Disp: , Rfl:  .  pantoprazole (PROTONIX) 20 MG tablet, Take 20 mg by mouth daily., Disp: , Rfl:   Allergies  Allergen Reactions  . Lisinopril Other (See Comments)    Cough    Social History  Substance Use Topics  . Smoking status: Never Smoker   . Smokeless tobacco: Never Used  . Alcohol Use: 2.4 oz/week    4 Standard drinks or equivalent per week     Comment: occassionally-social    Family History  Problem Relation  Age of Onset  . Pancreatic cancer Mother   . Breast cancer Maternal Grandmother     Age 38     Review of Systems  Constitutional: Negative.   HENT: Negative.   Eyes: Negative.   Respiratory: Negative.   Cardiovascular: Negative.   Gastrointestinal: Positive for heartburn. Negative for nausea, vomiting, abdominal pain, diarrhea, constipation, blood in stool and melena.  Genitourinary: Negative.   Musculoskeletal: Positive for joint pain. Negative for myalgias, back pain, falls and neck pain.  Skin: Negative.   Neurological: Negative.   Endo/Heme/Allergies: Negative.   Psychiatric/Behavioral: Negative.     Objective:  Physical Exam  Constitutional: She is oriented to person, place, and time. She appears well-developed and well-nourished. No distress.  HENT:  Head: Normocephalic and atraumatic.  Right Ear: External ear normal.  Left Ear: External ear normal.  Nose: Nose normal.  Mouth/Throat: Oropharynx is clear and moist.  Eyes: Conjunctivae and EOM are normal.  Neck: Normal range of motion. Neck supple.  Cardiovascular: Normal rate, regular rhythm, normal heart sounds and intact distal pulses.   No murmur heard. Respiratory: Effort normal and breath sounds normal. No respiratory distress. She has no wheezes.  GI: Soft. Bowel sounds are normal. She exhibits no distension. There is no tenderness.  Musculoskeletal:       Right hip: Normal.       Left hip: Normal.       Left knee: Normal.  Evaluation of her right knee shows no effusion. Her range of motion of right knee is about 0 to 125. She is very tender medially. There is no lateral tenderness or any instability noted.  Neurological: She is alert and oriented to person, place, and time. She has normal strength and normal reflexes. No sensory deficit.  Skin: No rash noted. She is not diaphoretic. No erythema.  Psychiatric: She has a normal mood and affect. Her behavior is normal.    Vital signs in last 24 hours: Temp:   [98.4 F (36.9 C)] 98.4 F (36.9 C) (03/07 1117) Pulse Rate:  [77] 77 (03/07 1117) Resp:  [16] 16 (03/07 1117) BP: (122)/(86) 122/86 mmHg (03/07 1117) SpO2:  [99 %] 99 % (03/07 1117) Weight:  [73.029 kg (161 lb)] 73.029 kg (161 lb) (03/07 1117)    Imaging Review Plain radiographs demonstrate severe degenerative joint disease of the right knee(s). The overall alignment ismild varus. The bone quality appears to be good for age and reported activity level.  Assessment/Plan:  End stage primary osteoarthritis, right knee   The patient history, physical examination, clinical judgment of the provider and imaging studies are consistent with end stage degenerative joint disease of the right knee(s) and partial knee arthroplasty is deemed medically necessary. The treatment options including medical management, injection therapy  arthroscopy and arthroplasty were discussed at length. The risks and benefits of partial knee arthroplasty were presented and reviewed. The risks due to aseptic loosening, infection, stiffness, patella tracking problems, thromboembolic complications and other imponderables were discussed. The patient acknowledged the explanation, agreed to proceed with the plan and consent was signed. Patient is being admitted for inpatient treatment for surgery, pain control, PT, OT, prophylactic antibiotics, VTE prophylaxis, progressive ambulation and ADL's and discharge planning. The patient is planning to be discharged home with home health services    PCP: Dr. Lisa Roca, PA-C

## 2015-04-19 ENCOUNTER — Encounter (HOSPITAL_COMMUNITY)
Admission: RE | Disposition: A | Discharge status: Home / Self Care | Payer: Self-pay | Source: Ambulatory Visit | Attending: Orthopedic Surgery

## 2015-04-19 ENCOUNTER — Ambulatory Visit (HOSPITAL_COMMUNITY): Payer: Medicare Other | Admitting: Anesthesiology

## 2015-04-19 ENCOUNTER — Encounter (HOSPITAL_COMMUNITY): Payer: Self-pay | Admitting: *Deleted

## 2015-04-19 ENCOUNTER — Observation Stay (HOSPITAL_COMMUNITY)
Admission: RE | Admit: 2015-04-19 | Discharge: 2015-04-20 | Disposition: A | Discharge status: Home / Self Care | Payer: Medicare Other | Source: Ambulatory Visit | Attending: Orthopedic Surgery | Admitting: Orthopedic Surgery

## 2015-04-19 DIAGNOSIS — M8580 Other specified disorders of bone density and structure, unspecified site: Secondary | ICD-10-CM | POA: Insufficient documentation

## 2015-04-19 DIAGNOSIS — E669 Obesity, unspecified: Secondary | ICD-10-CM | POA: Insufficient documentation

## 2015-04-19 DIAGNOSIS — I10 Essential (primary) hypertension: Secondary | ICD-10-CM | POA: Diagnosis not present

## 2015-04-19 DIAGNOSIS — Z96652 Presence of left artificial knee joint: Secondary | ICD-10-CM | POA: Diagnosis not present

## 2015-04-19 DIAGNOSIS — K219 Gastro-esophageal reflux disease without esophagitis: Secondary | ICD-10-CM | POA: Diagnosis not present

## 2015-04-19 DIAGNOSIS — Z79899 Other long term (current) drug therapy: Secondary | ICD-10-CM | POA: Diagnosis not present

## 2015-04-19 DIAGNOSIS — M1711 Unilateral primary osteoarthritis, right knee: Secondary | ICD-10-CM | POA: Diagnosis present

## 2015-04-19 DIAGNOSIS — Z6833 Body mass index (BMI) 33.0-33.9, adult: Secondary | ICD-10-CM | POA: Insufficient documentation

## 2015-04-19 DIAGNOSIS — M179 Osteoarthritis of knee, unspecified: Secondary | ICD-10-CM | POA: Diagnosis present

## 2015-04-19 DIAGNOSIS — M1710 Unilateral primary osteoarthritis, unspecified knee: Secondary | ICD-10-CM | POA: Diagnosis present

## 2015-04-19 HISTORY — PX: PARTIAL KNEE ARTHROPLASTY: SHX2174

## 2015-04-19 LAB — TYPE AND SCREEN
ABO/RH(D): B POS
Antibody Screen: NEGATIVE

## 2015-04-19 SURGERY — ARTHROPLASTY, KNEE, UNICOMPARTMENTAL
Anesthesia: Spinal | Site: Knee | Laterality: Right

## 2015-04-19 MED ORDER — ACETAMINOPHEN 650 MG RE SUPP: 650.0000 mg | Freq: Four times a day (QID) | RECTAL | Status: DC | PRN

## 2015-04-19 MED ORDER — LIDOCAINE HCL (CARDIAC) 20 MG/ML IV SOLN
INTRAVENOUS | Status: AC
  Filled 2015-04-19: qty 5

## 2015-04-19 MED ORDER — FENTANYL CITRATE (PF) 100 MCG/2ML IJ SOLN
INTRAMUSCULAR | Status: AC
  Filled 2015-04-19: qty 2

## 2015-04-19 MED ORDER — SODIUM CHLORIDE 0.9 % IV SOLN
INTRAVENOUS | Status: DC
  Administered 2015-04-19 – 2015-04-20 (×2): via INTRAVENOUS

## 2015-04-19 MED ORDER — ACETAMINOPHEN 325 MG PO TABS: 650.0000 mg | ORAL_TABLET | Freq: Four times a day (QID) | ORAL | Status: DC | PRN

## 2015-04-19 MED ORDER — TRANEXAMIC ACID 1000 MG/10ML IV SOLN
1000.0000 mg | INTRAVENOUS | Status: AC
  Administered 2015-04-19: 1000 mg via INTRAVENOUS
  Filled 2015-04-19: qty 10

## 2015-04-19 MED ORDER — FENTANYL CITRATE (PF) 100 MCG/2ML IJ SOLN
25.0000 ug | INTRAMUSCULAR | Status: DC | PRN
  Administered 2015-04-19 (×2): 50 ug via INTRAVENOUS

## 2015-04-19 MED ORDER — METHOCARBAMOL 1000 MG/10ML IJ SOLN
500.0000 mg | Freq: Four times a day (QID) | INTRAVENOUS | Status: DC | PRN
  Administered 2015-04-19: 500 mg via INTRAVENOUS
  Filled 2015-04-19 (×2): qty 5

## 2015-04-19 MED ORDER — RIVAROXABAN 10 MG PO TABS
10.0000 mg | ORAL_TABLET | Freq: Every day | ORAL | Status: DC
  Administered 2015-04-20: 10 mg via ORAL
  Filled 2015-04-19 (×2): qty 1

## 2015-04-19 MED ORDER — PANTOPRAZOLE SODIUM 20 MG PO TBEC
20.0000 mg | DELAYED_RELEASE_TABLET | Freq: Every day | ORAL | Status: DC
  Administered 2015-04-19 – 2015-04-20 (×2): 20 mg via ORAL
  Filled 2015-04-19 (×2): qty 1

## 2015-04-19 MED ORDER — BUPIVACAINE LIPOSOME 1.3 % IJ SUSP
20.0000 mL | Freq: Once | INTRAMUSCULAR | Status: DC
  Filled 2015-04-19: qty 20

## 2015-04-19 MED ORDER — ONDANSETRON HCL 4 MG/2ML IJ SOLN: 4.0000 mg | Freq: Four times a day (QID) | INTRAMUSCULAR | Status: DC | PRN

## 2015-04-19 MED ORDER — SODIUM CHLORIDE 0.9 % IJ SOLN
INTRAMUSCULAR | Status: DC | PRN
  Administered 2015-04-19: 30 mL

## 2015-04-19 MED ORDER — METHOCARBAMOL 500 MG PO TABS
500.0000 mg | ORAL_TABLET | Freq: Four times a day (QID) | ORAL | Status: DC | PRN
  Administered 2015-04-19 – 2015-04-20 (×2): 500 mg via ORAL
  Filled 2015-04-19 (×3): qty 1

## 2015-04-19 MED ORDER — PROPOFOL 10 MG/ML IV BOLUS
INTRAVENOUS | Status: AC
  Filled 2015-04-19: qty 20

## 2015-04-19 MED ORDER — PROPOFOL 10 MG/ML IV BOLUS
INTRAVENOUS | Status: DC | PRN
  Administered 2015-04-19: 150 mg via INTRAVENOUS

## 2015-04-19 MED ORDER — METOCLOPRAMIDE HCL 10 MG PO TABS: 5.0000 mg | ORAL_TABLET | Freq: Three times a day (TID) | ORAL | Status: DC | PRN

## 2015-04-19 MED ORDER — METOCLOPRAMIDE HCL 5 MG/ML IJ SOLN: 5.0000 mg | Freq: Three times a day (TID) | INTRAMUSCULAR | Status: DC | PRN

## 2015-04-19 MED ORDER — MIDAZOLAM HCL 5 MG/5ML IJ SOLN
INTRAMUSCULAR | Status: DC | PRN
  Administered 2015-04-19: 2 mg via INTRAVENOUS

## 2015-04-19 MED ORDER — FLEET ENEMA 7-19 GM/118ML RE ENEM: 1.0000 | ENEMA | Freq: Once | RECTAL | Status: DC | PRN

## 2015-04-19 MED ORDER — ACETAMINOPHEN 10 MG/ML IV SOLN
INTRAVENOUS | Status: AC
  Filled 2015-04-19: qty 100

## 2015-04-19 MED ORDER — SODIUM CHLORIDE 0.9 % IV SOLN
INTRAVENOUS | Status: DC
  Administered 2015-04-19: 10:00:00 via INTRAVENOUS

## 2015-04-19 MED ORDER — MIDAZOLAM HCL 2 MG/2ML IJ SOLN
INTRAMUSCULAR | Status: AC
  Filled 2015-04-19: qty 2

## 2015-04-19 MED ORDER — LOSARTAN POTASSIUM 50 MG PO TABS
100.0000 mg | ORAL_TABLET | Freq: Every day | ORAL | Status: DC
  Administered 2015-04-19 – 2015-04-20 (×2): 100 mg via ORAL
  Filled 2015-04-19 (×2): qty 2

## 2015-04-19 MED ORDER — DOCUSATE SODIUM 100 MG PO CAPS
100.0000 mg | ORAL_CAPSULE | Freq: Two times a day (BID) | ORAL | Status: DC
  Administered 2015-04-19 – 2015-04-20 (×2): 100 mg via ORAL

## 2015-04-19 MED ORDER — LORATADINE 10 MG PO TABS
10.0000 mg | ORAL_TABLET | Freq: Every day | ORAL | Status: DC
  Administered 2015-04-20: 10 mg via ORAL
  Filled 2015-04-19: qty 1

## 2015-04-19 MED ORDER — BUPIVACAINE LIPOSOME 1.3 % IJ SUSP
INTRAMUSCULAR | Status: DC | PRN
  Administered 2015-04-19: 20 mL

## 2015-04-19 MED ORDER — SODIUM CHLORIDE 0.9 % IJ SOLN
INTRAMUSCULAR | Status: AC
  Filled 2015-04-19: qty 50

## 2015-04-19 MED ORDER — LACTATED RINGERS IV SOLN
INTRAVENOUS | Status: DC | PRN
  Administered 2015-04-19 (×2): via INTRAVENOUS

## 2015-04-19 MED ORDER — MORPHINE SULFATE (PF) 2 MG/ML IV SOLN: 1.0000 mg | INTRAVENOUS | Status: DC | PRN

## 2015-04-19 MED ORDER — POLYETHYLENE GLYCOL 3350 17 G PO PACK: 17.0000 g | PACK | Freq: Every day | ORAL | Status: DC | PRN

## 2015-04-19 MED ORDER — LIP MEDEX EX OINT
TOPICAL_OINTMENT | CUTANEOUS | Status: AC
  Administered 2015-04-19: 14:00:00
  Filled 2015-04-19: qty 7

## 2015-04-19 MED ORDER — ONDANSETRON HCL 4 MG/2ML IJ SOLN
INTRAMUSCULAR | Status: AC
  Filled 2015-04-19: qty 2

## 2015-04-19 MED ORDER — FENTANYL CITRATE (PF) 100 MCG/2ML IJ SOLN
INTRAMUSCULAR | Status: DC | PRN
  Administered 2015-04-19 (×4): 50 ug via INTRAVENOUS

## 2015-04-19 MED ORDER — BUPIVACAINE HCL (PF) 0.25 % IJ SOLN
INTRAMUSCULAR | Status: DC | PRN
  Administered 2015-04-19: 20 mL

## 2015-04-19 MED ORDER — CEFAZOLIN SODIUM-DEXTROSE 2-3 GM-% IV SOLR
INTRAVENOUS | Status: AC
  Filled 2015-04-19: qty 50

## 2015-04-19 MED ORDER — CEFAZOLIN SODIUM-DEXTROSE 2-3 GM-% IV SOLR
2.0000 g | Freq: Four times a day (QID) | INTRAVENOUS | Status: AC
  Administered 2015-04-19 (×2): 2 g via INTRAVENOUS
  Filled 2015-04-19 (×2): qty 50

## 2015-04-19 MED ORDER — DEXAMETHASONE SODIUM PHOSPHATE 10 MG/ML IJ SOLN
10.0000 mg | Freq: Once | INTRAMUSCULAR | Status: AC
  Administered 2015-04-20: 10 mg via INTRAVENOUS
  Filled 2015-04-19: qty 1

## 2015-04-19 MED ORDER — PANTOPRAZOLE SODIUM 40 MG PO TBEC
40.0000 mg | DELAYED_RELEASE_TABLET | Freq: Every day | ORAL | Status: DC
  Filled 2015-04-19: qty 1

## 2015-04-19 MED ORDER — DEXAMETHASONE SODIUM PHOSPHATE 10 MG/ML IJ SOLN
INTRAMUSCULAR | Status: DC | PRN
  Administered 2015-04-19: 10 mg via INTRAVENOUS

## 2015-04-19 MED ORDER — 0.9 % SODIUM CHLORIDE (POUR BTL) OPTIME
TOPICAL | Status: DC | PRN
  Administered 2015-04-19: 1000 mL

## 2015-04-19 MED ORDER — OXYCODONE HCL 5 MG PO TABS
5.0000 mg | ORAL_TABLET | ORAL | Status: DC | PRN
  Administered 2015-04-19: 10 mg via ORAL
  Administered 2015-04-19: 5 mg via ORAL
  Administered 2015-04-19: 10 mg via ORAL
  Administered 2015-04-19: 5 mg via ORAL
  Administered 2015-04-20 (×3): 10 mg via ORAL
  Filled 2015-04-19: qty 1
  Filled 2015-04-19 (×4): qty 2
  Filled 2015-04-19: qty 1
  Filled 2015-04-19: qty 2

## 2015-04-19 MED ORDER — SUCCINYLCHOLINE CHLORIDE 20 MG/ML IJ SOLN
INTRAMUSCULAR | Status: DC | PRN
  Administered 2015-04-19: 100 mg via INTRAVENOUS

## 2015-04-19 MED ORDER — MENTHOL 3 MG MT LOZG: 1.0000 | LOZENGE | OROMUCOSAL | Status: DC | PRN

## 2015-04-19 MED ORDER — HYDROCHLOROTHIAZIDE 25 MG PO TABS
25.0000 mg | ORAL_TABLET | Freq: Every day | ORAL | Status: DC
  Administered 2015-04-19 – 2015-04-20 (×2): 25 mg via ORAL
  Filled 2015-04-19 (×2): qty 1

## 2015-04-19 MED ORDER — DEXAMETHASONE SODIUM PHOSPHATE 10 MG/ML IJ SOLN
INTRAMUSCULAR | Status: AC
Start: 2015-04-19 — End: 2015-04-19
  Filled 2015-04-19: qty 1

## 2015-04-19 MED ORDER — SODIUM CHLORIDE 0.9 % IV SOLN
1000.0000 mg | Freq: Once | INTRAVENOUS | Status: AC
  Administered 2015-04-19: 1000 mg via INTRAVENOUS
  Filled 2015-04-19: qty 10

## 2015-04-19 MED ORDER — TRAMADOL HCL 50 MG PO TABS: 50.0000 mg | ORAL_TABLET | Freq: Four times a day (QID) | ORAL | Status: DC | PRN

## 2015-04-19 MED ORDER — PHENYLEPHRINE HCL 10 MG/ML IJ SOLN
INTRAMUSCULAR | Status: DC | PRN
  Administered 2015-04-19: 80 ug via INTRAVENOUS

## 2015-04-19 MED ORDER — ONDANSETRON HCL 4 MG/2ML IJ SOLN
INTRAMUSCULAR | Status: DC | PRN
  Administered 2015-04-19: 4 mg via INTRAVENOUS

## 2015-04-19 MED ORDER — PHENOL 1.4 % MT LIQD
1.0000 | OROMUCOSAL | Status: DC | PRN
  Filled 2015-04-19: qty 177

## 2015-04-19 MED ORDER — DIPHENHYDRAMINE HCL 12.5 MG/5ML PO ELIX: 12.5000 mg | ORAL_SOLUTION | ORAL | Status: DC | PRN

## 2015-04-19 MED ORDER — LIDOCAINE HCL (CARDIAC) 20 MG/ML IV SOLN
INTRAVENOUS | Status: DC | PRN
  Administered 2015-04-19: 100 mg via INTRAVENOUS

## 2015-04-19 MED ORDER — LOSARTAN POTASSIUM-HCTZ 100-25 MG PO TABS: 1.0000 | ORAL_TABLET | Freq: Every day | ORAL | Status: DC

## 2015-04-19 MED ORDER — ACETAMINOPHEN 500 MG PO TABS
1000.0000 mg | ORAL_TABLET | Freq: Four times a day (QID) | ORAL | Status: AC
  Administered 2015-04-19 – 2015-04-20 (×4): 1000 mg via ORAL
  Filled 2015-04-19 (×4): qty 2

## 2015-04-19 MED ORDER — ONDANSETRON HCL 4 MG PO TABS: 4.0000 mg | ORAL_TABLET | Freq: Four times a day (QID) | ORAL | Status: DC | PRN

## 2015-04-19 MED ORDER — BUPIVACAINE HCL (PF) 0.25 % IJ SOLN
INTRAMUSCULAR | Status: AC
  Filled 2015-04-19: qty 30

## 2015-04-19 MED ORDER — DEXAMETHASONE SODIUM PHOSPHATE 10 MG/ML IJ SOLN: 10.0000 mg | Freq: Once | INTRAMUSCULAR | Status: DC

## 2015-04-19 MED ORDER — CEFAZOLIN SODIUM-DEXTROSE 2-3 GM-% IV SOLR
2.0000 g | INTRAVENOUS | Status: AC
  Administered 2015-04-19: 2 g via INTRAVENOUS

## 2015-04-19 MED ORDER — BISACODYL 10 MG RE SUPP: 10.0000 mg | Freq: Every day | RECTAL | Status: DC | PRN

## 2015-04-19 MED ORDER — ONDANSETRON HCL 4 MG/2ML IJ SOLN: 4.0000 mg | Freq: Once | INTRAMUSCULAR | Status: DC | PRN

## 2015-04-19 MED ORDER — ACETAMINOPHEN 10 MG/ML IV SOLN
1000.0000 mg | Freq: Once | INTRAVENOUS | Status: AC
  Administered 2015-04-19: 1000 mg via INTRAVENOUS
  Filled 2015-04-19: qty 100

## 2015-04-19 SURGICAL SUPPLY — 48 items
BAG DECANTER FOR FLEXI CONT (MISCELLANEOUS) ×1 IMPLANT
BAG SPEC THK2 15X12 ZIP CLS (MISCELLANEOUS)
BAG ZIPLOCK 12X15 (MISCELLANEOUS) IMPLANT
BANDAGE ACE 6X5 VEL STRL LF (GAUZE/BANDAGES/DRESSINGS) ×2 IMPLANT
BLADE SAW RECIPROCATING 77.5 (BLADE) ×2 IMPLANT
BLADE SAW SGTL 13.0X1.19X90.0M (BLADE) ×2 IMPLANT
BOWL SMART MIX CTS (DISPOSABLE) ×2 IMPLANT
BUR OVAL CARBIDE 4.0 (BURR) ×2 IMPLANT
CAPT KNEE PARTIAL 2 ×2 IMPLANT
CEMENT HV SMART SET (Cement) ×2 IMPLANT
CLOTH BEACON ORANGE TIMEOUT ST (SAFETY) ×2 IMPLANT
CUFF TOURN SGL QUICK 34 (TOURNIQUET CUFF) ×2
CUFF TRNQT CYL 34X4X40X1 (TOURNIQUET CUFF) ×1 IMPLANT
DRSG ADAPTIC 3X8 NADH LF (GAUZE/BANDAGES/DRESSINGS) ×2 IMPLANT
DRSG PAD ABDOMINAL 8X10 ST (GAUZE/BANDAGES/DRESSINGS) ×2 IMPLANT
DURAPREP 26ML APPLICATOR (WOUND CARE) ×2 IMPLANT
ELECT REM PT RETURN 9FT ADLT (ELECTROSURGICAL) ×2
ELECTRODE REM PT RTRN 9FT ADLT (ELECTROSURGICAL) ×1 IMPLANT
EVACUATOR 1/8 PVC DRAIN (DRAIN) ×2 IMPLANT
GAUZE SPONGE 4X4 12PLY STRL (GAUZE/BANDAGES/DRESSINGS) ×2 IMPLANT
GLOVE BIO SURGEON STRL SZ7 (GLOVE) ×1 IMPLANT
GLOVE BIO SURGEON STRL SZ7.5 (GLOVE) ×2 IMPLANT
GLOVE BIO SURGEON STRL SZ8 (GLOVE) ×2 IMPLANT
GLOVE BIOGEL PI IND STRL 6.5 (GLOVE) IMPLANT
GLOVE BIOGEL PI IND STRL 8 (GLOVE) ×2 IMPLANT
GLOVE BIOGEL PI INDICATOR 6.5 (GLOVE) ×1
GLOVE BIOGEL PI INDICATOR 8 (GLOVE) ×2
GOWN STRL REUS W/TWL LRG LVL3 (GOWN DISPOSABLE) ×3 IMPLANT
GOWN STRL REUS W/TWL XL LVL3 (GOWN DISPOSABLE) ×2 IMPLANT
HANDPIECE INTERPULSE COAX TIP (DISPOSABLE) ×2
IMMOBILIZER KNEE 20 (SOFTGOODS) ×2
IMMOBILIZER KNEE 20 THIGH 36 (SOFTGOODS) ×1 IMPLANT
KIT IMPL STRL TIB IPOLY IUNI IMPLANT
LIQUID BAND (GAUZE/BANDAGES/DRESSINGS) ×1 IMPLANT
MANIFOLD NEPTUNE II (INSTRUMENTS) ×2 IMPLANT
PACK TOTAL KNEE CUSTOM (KITS) ×2 IMPLANT
PADDING CAST COTTON 6X4 STRL (CAST SUPPLIES) ×4 IMPLANT
POSITIONER SURGICAL ARM (MISCELLANEOUS) ×2 IMPLANT
SET HNDPC FAN SPRY TIP SCT (DISPOSABLE) ×1 IMPLANT
STRIP CLOSURE SKIN 1/2X4 (GAUZE/BANDAGES/DRESSINGS) ×4 IMPLANT
SUT MNCRL AB 4-0 PS2 18 (SUTURE) ×2 IMPLANT
SUT VIC AB 2-0 CT1 27 (SUTURE) ×4
SUT VIC AB 2-0 CT1 TAPERPNT 27 (SUTURE) ×2 IMPLANT
SUT VLOC 180 0 24IN GS25 (SUTURE) ×2 IMPLANT
SYR 50ML LL SCALE MARK (SYRINGE) ×2 IMPLANT
TRAY FOLEY W/METER SILVER 14FR (SET/KITS/TRAYS/PACK) ×2 IMPLANT
TRAY FOLEY W/METER SILVER 16FR (SET/KITS/TRAYS/PACK) ×1 IMPLANT
WRAP KNEE MAXI GEL POST OP (GAUZE/BANDAGES/DRESSINGS) ×1 IMPLANT

## 2015-04-19 NOTE — Anesthesia Preprocedure Evaluation (Addendum)
Anesthesia Evaluation  Patient identified by MRN, date of birth, ID band Patient awake    Reviewed: Allergy & Precautions, NPO status , Patient's Chart, lab work & pertinent test results  History of Anesthesia Complications Negative for: history of anesthetic complications  Airway Mallampati: II  TM Distance: >3 FB Neck ROM: Full    Dental no notable dental hx. (+) Dental Advisory Given   Pulmonary neg pulmonary ROS,    Pulmonary exam normal breath sounds clear to auscultation       Cardiovascular hypertension, Pt. on medications Normal cardiovascular exam Rhythm:Regular Rate:Normal     Neuro/Psych negative neurological ROS  negative psych ROS   GI/Hepatic Neg liver ROS, GERD  Medicated and Controlled,  Endo/Other  obesity  Renal/GU negative Renal ROS  negative genitourinary   Musculoskeletal  (+) Arthritis ,   Abdominal   Peds negative pediatric ROS (+)  Hematology negative hematology ROS (+)   Anesthesia Other Findings   Reproductive/Obstetrics negative OB ROS                            Anesthesia Physical Anesthesia Plan  ASA: II  Anesthesia Plan: General   Post-op Pain Management:    Induction:   Airway Management Planned: Oral ETT  Additional Equipment:   Intra-op Plan:   Post-operative Plan: Extubation in OR  Informed Consent: I have reviewed the patients History and Physical, chart, labs and discussed the procedure including the risks, benefits and alternatives for the proposed anesthesia with the patient or authorized representative who has indicated his/her understanding and acceptance.   Dental advisory given  Plan Discussed with: CRNA  Anesthesia Plan Comments: (Patient refuses spinal due to hardware in her back)       Anesthesia Quick Evaluation

## 2015-04-19 NOTE — OR Nursing (Signed)
Used Arista AH in the knee cavity. 3 grams

## 2015-04-19 NOTE — Transfer of Care (Signed)
Immediate Anesthesia Transfer of Care Note  Patient: Robin Francis  Procedure(s) Performed: Procedure(s): UNICOMPARTMENTAL KNEE MEDIAL REPLACMENT (Right)  Patient Location: PACU  Anesthesia Type:General  Level of Consciousness: sedated  Airway & Oxygen Therapy: Patient Spontanous Breathing and Patient connected to face mask oxygen  Post-op Assessment: Report given to RN and Post -op Vital signs reviewed and stable  Post vital signs: Reviewed and stable  Last Vitals:  Filed Vitals:   04/19/15 0611  BP: 122/70  Pulse: 88  Temp: 36.9 C  Resp: 16    Complications: No apparent anesthesia complications

## 2015-04-19 NOTE — Interval H&P Note (Signed)
History and Physical Interval Note:  04/19/2015 6:44 AM  [name]  has presented today for surgery, with the diagnosis of RIGHT KNEE MEDIAL COMPARTMENT OA  The various methods of treatment have been discussed with the patient and family. After consideration of risks, benefits and other options for treatment, the patient has consented to  Procedure(s): UNICOMPARTMENTAL KNEE MEDIAL REPLACMENT (Right) as a surgical intervention .  The patient's history has been reviewed, patient examined, no change in status, stable for surgery.  I have reviewed the patient's chart and labs.  Questions were answered to the patient's satisfaction.     Robin Francis

## 2015-04-19 NOTE — Anesthesia Procedure Notes (Signed)
Procedure Name: Intubation Date/Time: 04/19/2015 8:03 AM Performed by: Lind Covert Pre-anesthesia Checklist: Patient identified, Timeout performed, Emergency Drugs available, Suction available and Patient being monitored Patient Re-evaluated:Patient Re-evaluated prior to inductionOxygen Delivery Method: Circle system utilized Preoxygenation: Pre-oxygenation with 100% oxygen Intubation Type: IV induction Laryngoscope Size: Mac and 3 Grade View: Grade I Tube type: Oral Tube size: 7.0 mm Number of attempts: 1 Airway Equipment and Method: Stylet Placement Confirmation: ETT inserted through vocal cords under direct vision,  breath sounds checked- equal and bilateral and positive ETCO2 Secured at: 20 cm Tube secured with: Tape Dental Injury: Teeth and Oropharynx as per pre-operative assessment

## 2015-04-19 NOTE — Anesthesia Postprocedure Evaluation (Signed)
Anesthesia Post Note  Patient: Robin Francis  Procedure(s) Performed: Procedure(s) (LRB): UNICOMPARTMENTAL KNEE MEDIAL REPLACMENT (Right)  Patient location during evaluation: PACU Anesthesia Type: General Level of consciousness: awake and alert Pain management: pain level controlled Vital Signs Assessment: post-procedure vital signs reviewed and stable Respiratory status: spontaneous breathing, nonlabored ventilation, respiratory function stable and patient connected to nasal cannula oxygen Cardiovascular status: blood pressure returned to baseline and stable Postop Assessment: no signs of nausea or vomiting Anesthetic complications: no    Last Vitals:  Filed Vitals:   04/19/15 0930 04/19/15 0945  BP: 124/77 139/86  Pulse: 87 76  Temp: 36.6 C   Resp: 14 14    Last Pain:  Filed Vitals:   04/19/15 0953  PainSc: 5                  Voshon Petro JENNETTE

## 2015-04-19 NOTE — Op Note (Signed)
OPERATIVE REPORT  PREOPERATIVE DIAGNOSIS: Medial compartment osteoarthritis, Right knee  POSTOPERATIVE DIAGNOSIS: Medial compartment osteoarthritis, Right knee  PROCEDURE:Right knee medial unicompartmental arthroplasty.   SURGEON: Gaynelle Arabian, MD   ASSISTANT: Arlee Muslim, PA-C  ANESTHESIA:  General.   ESTIMATED BLOOD LOSS: Minimal.   DRAINS: Hemovac x1.   TOURNIQUET TIME: 34 minutes at 300 mmHg.   COMPLICATIONS: None.   CONDITION: Stable to recovery.   BRIEF CLINICAL NOTE:Robin Francis is a 70 y.o. female, who has  significant isolated medial compartment arthritis of the Right knee. She has had nonoperative management including injections. She has had  cortisone and viscous supplements. Unfortunately, the pain persists.  Radiograph showed isolated medial compartment bone-on-bone arthritis  with normal-appearing patellofemoral and lateral compartments. She  presents now for right knee unicompartmental arthroplasty.   PROCEDURE IN DETAIL: After successful administration of  General anesthetic, a tourniquet was placed high on the  Right thigh and right lower extremity prepped and draped in usual sterile fashion. Extremity was wrapped in an Esmarch, knee flexed, and tourniquet inflated to 300 mmHg. A midline incision was made with a 10 blade through subcutaneous  tissue to the extensor mechanism. A fresh blade was used to make a  medial parapatellar arthrotomy. Soft tissue on the proximal medial  tibia subperiosteally elevated to the joint line with a knife and into  the semimembranosus bursa with a Cobb elevator. The patella was  subluxed laterally, and the knee flexed 90 degrees. The ACL was intact.  The marginal osteophytes on the medial femur and tibia were removed with  a rongeur. The medial meniscus was also removed. The femoral cutting  block where the conformis unicompartmental knee system was placed along  the femur. There was excellent fit. I  traced the outline. We then  removed any remaining cartilage within this outline. We then placed the  cutting block again and pinned in position. The posterior femoral cut  was made, it was approximately 5 mm. The lug holes for the femoral  component were then drilled through the cutting block. The cutting  block was subsequently removed. We then utilized the high speed burr to  create a small trough at the superior aspect of the components that of  the inset and would not overhang the cartilage. The trial was placed,  it had excellent fit. The trial was subsequently removed.       The trial was placed again and the B chip was placed. There was  excellent balance throughout full motion. Also with excellent fit on  her tibia. This was removed as was the femoral trial. A curette was  used to remove any remaining cartilage from the tibia. The tibial  cutting block was then placed and there was a perfect fit on the tibial  surface. The appropriate slope was placed and it was pinned in  position. The reciprocating saw was used to make the central cut and  then the oscillating saw used to make the horizontal cut. The bone  fragment was then removed. The tibial trial was placed and had perfect  fit on the tibia. We then drilled the 2 lug holes and did the keel punch.  We then placed tibia trial femur, and a 6 mm trial insert. There was  excellent stability throughout full range of motion and no impingement.  The trial was then removed. We drilled small holes in the distal  femur in order to create more conduits for the cement. The cut bone  surfaces were  thoroughly irrigated with pulsatile lavage while the  cement was mixed on the back table. We then cemented the tibial  component into place, impacted it and removed the extruded cement. The  same was done for the femoral component. Trial 6-mm inserts placed,  knee held in full extension, and all extruded cement removed. While the  cement was  hardening, I injected the extensor mechanism, periosteum of  the femur and subcu tissues, a total of 20 mL of Exparel mixed with 30  mL of saline and then did an additional injection of 20 mL of 0.25%  Marcaine into the same tissues. When the cement had fully hardened,  then the permanent polyethylene was placed in tibial tray. There was  excellent stability throughout full range of motion with no lift off the  component and no evidence of any impingement. Wound was copiously  irrigated with saline solution, and the arthrotomy closed over a Hemovac  drain with a running #1 V-Loc suture. The subcutaneous was closed with  interrupted 2-0 Vicryl and subcuticular running 4-0 Monocryl. The drain  was hooked to suction. Incision cleaned and dried and Steri-Strips and  a bulky sterile dressing applied. The tourniquet was released after a  total time of 34 minutes. This was done after closing the extensor  mechanism. The wound was closed and a bulky sterile dressing was  applied. She was placed into a knee immobilizer, awakened and  transported to recovery room in stable condition.  Please note that a surgical assistant was a medical necessity for this  procedure in order to perform it in a safe and expeditious manner.  Assistance was necessary for retracting vital ligaments, neurovascular  structures, as well as for proper positioning of the limb to allow for  appropriate bone cuts and appropriate placement of the prosthesis.    Dione Plover Carroll Ranney, MD

## 2015-04-19 NOTE — Evaluation (Signed)
Physical Therapy Evaluation Patient Details Name: Robin Francis MRN: [id number] DOB: [birth year] Today's Date: 04/19/2015   History of Present Illness  Pt is a 70 year old female s/p right knee medial unicompartmental arthroplasty  Clinical Impression  Patient is s/p above surgery resulting in functional limitations due to the deficits listed below (see PT Problem List).  Patient will benefit from skilled PT to increase their independence and safety with mobility to allow discharge to the venue listed below.   Pt mobilizing well POD #0.     Follow Up Recommendations Home health PT    Equipment Recommendations  None recommended by PT    Recommendations for Other Services       Precautions / Restrictions Precautions Precautions: Knee Required Braces or Orthoses: Knee Immobilizer - Right Restrictions Weight Bearing Restrictions: No Other Position/Activity Restrictions: WBAT      Mobility  Bed Mobility Overal bed mobility: Needs Assistance Bed Mobility: Supine to Sit     Supine to sit: HOB elevated;Min assist     General bed mobility comments: verbal cues for technique, assist for R LE  Transfers Overall transfer level: Needs assistance Equipment used: Rolling walker (2 wheeled) Transfers: Sit to/from Stand Sit to Stand: Min guard         General transfer comment: verbal cues for UE and LE positioning  Ambulation/Gait Ambulation/Gait assistance: Min guard Ambulation Distance (Feet): 60 Feet Assistive device: Rolling walker (2 wheeled) Gait Pattern/deviations: Step-to pattern;Antalgic     General Gait Details: verbal cues for sequence, RW distance, posture, nauseated end of ambulation  Stairs            Wheelchair Mobility    Modified Rankin (Stroke Patients Only)       Balance                                             Pertinent Vitals/Pain Pain Assessment: 0-10 Pain Score: 1  Pain Location: R knee Pain Descriptors /  Indicators: Sore Pain Intervention(s): Limited activity within patient's tolerance;Monitored during session;Premedicated before session    Home Living Family/patient expects to be discharged to:: Private residence Living Arrangements: Spouse/significant other Available Help at Discharge: Family Type of Home: House Home Access: Stairs to enter Entrance Stairs-Rails: None Entrance Stairs-Number of Steps: 1 Home Layout: One level Home Equipment: Toilet riser;Walker - 2 wheels      Prior Function Level of Independence: Independent               Hand Dominance        Extremity/Trunk Assessment               Lower Extremity Assessment: RLE deficits/detail RLE Deficits / Details: unable to perform SLR, maintained KI       Communication   Communication: No difficulties  Cognition Arousal/Alertness: Awake/alert Behavior During Therapy: WFL for tasks assessed/performed Overall Cognitive Status: Within Functional Limits for tasks assessed                      General Comments      Exercises        Assessment/Plan    PT Assessment Patient needs continued PT services  PT Diagnosis Difficulty walking;Acute pain   PT Problem List Decreased strength;Decreased range of motion;Decreased mobility;Pain  PT Treatment Interventions Functional mobility training;Stair training;Gait training;DME instruction;Patient/family education;Therapeutic activities;Therapeutic exercise   PT  Goals (Current goals can be found in the Care Plan section) Acute Rehab PT Goals PT Goal Formulation: With patient Time For Goal Achievement: 04/22/15 Potential to Achieve Goals: Good    Frequency 7X/week   Barriers to discharge        Co-evaluation               End of Session Equipment Utilized During Treatment: Gait belt Activity Tolerance: Patient tolerated treatment well Patient left: in chair;with call bell/phone within reach;with chair alarm set;with family/visitor  present      Functional Assessment Tool Used: clinical judgement Functional Limitation: Mobility: Walking and moving around Mobility: Walking and Moving Around Current Status [id number]): At least 20 percent but less than 40 percent impaired, limited or restricted Mobility: Walking and Moving Around Goal Status [phone]): At least 1 percent but less than 20 percent impaired, limited or restricted    Time: 1330-1345 PT Time Calculation (min) (ACUTE ONLY): 15 min   Charges:   PT Evaluation $PT Eval Low Complexity: 1 Procedure     PT G Codes:   PT G-Codes **NOT FOR INPATIENT CLASS** Functional Assessment Tool Used: clinical judgement Functional Limitation: Mobility: Walking and moving around Mobility: Walking and Moving Around Current Status [id number]): At least 20 percent but less than 40 percent impaired, limited or restricted Mobility: Walking and Moving Around Goal Status [phone]): At least 1 percent but less than 20 percent impaired, limited or restricted    Mashawn Brazil,KATHrine E 04/19/2015, 2:33 PM Carmelia Bake, PT, DPT 04/19/2015 Pager: [phone]

## 2015-04-20 DIAGNOSIS — M1711 Unilateral primary osteoarthritis, right knee: Secondary | ICD-10-CM | POA: Diagnosis not present

## 2015-04-20 LAB — BASIC METABOLIC PANEL
Anion gap: 11 (ref 5–15)
BUN: 8 mg/dL (ref 6–20)
CO2: 28 mmol/L (ref 22–32)
Calcium: 9.1 mg/dL (ref 8.9–10.3)
Chloride: 96 mmol/L — ABNORMAL LOW (ref 101–111)
Creatinine, Ser: 0.58 mg/dL (ref 0.44–1.00)
GFR calc Af Amer: >60 mL/min (ref >60)
GFR calc non Af Amer: >60 mL/min (ref >60)
Glucose, Bld: 135 mg/dL — ABNORMAL HIGH (ref 65–99)
Potassium: 3.9 mmol/L (ref 3.5–5.1)
SODIUM: 135 mmol/L (ref 135–145)

## 2015-04-20 LAB — CBC
HCT: 33.3 % — ABNORMAL LOW (ref 36.0–46.0)
Hemoglobin: 11.6 g/dL — ABNORMAL LOW (ref 12.0–15.0)
MCH: 29.9 pg (ref 26.0–34.0)
MCHC: 34.8 g/dL (ref 30.0–36.0)
MCV: 85.8 fL (ref 78.0–100.0)
PLATELETS: 322 10*3/uL (ref 150–400)
RBC: 3.88 MIL/uL (ref 3.87–5.11)
RDW: 12.5 % (ref 11.5–15.5)
WBC: 14.9 10*3/uL — ABNORMAL HIGH (ref 4.0–10.5)

## 2015-04-20 MED ORDER — METHOCARBAMOL 500 MG PO TABS: 500.0000 mg | ORAL_TABLET | Freq: Four times a day (QID) | ORAL | Status: DC | PRN

## 2015-04-20 MED ORDER — TRAMADOL HCL 50 MG PO TABS: 50.0000 mg | ORAL_TABLET | Freq: Four times a day (QID) | ORAL | Status: DC | PRN

## 2015-04-20 MED ORDER — OXYCODONE HCL 5 MG PO TABS: 5.0000 mg | ORAL_TABLET | ORAL | Status: DC | PRN

## 2015-04-20 MED ORDER — RIVAROXABAN 10 MG PO TABS: 10.0000 mg | ORAL_TABLET | Freq: Every day | ORAL | Status: DC

## 2015-04-20 NOTE — Discharge Summary (Signed)
Physician Discharge Summary   Patient ID: Robin Francis MRN: [id number] DOB/AGE: 68/02/1945 68 y.o.  Admit date: 04/19/2015 Discharge date: 04-20-2015  Primary Diagnosis:  Medial compartment osteoarthritis, Right knee  Admission Diagnoses:  Past Medical History  Diagnosis Date  . OSTEOARTHRITIS 09/17/2008  . Osteopenia 11/2014    T score -1.4 FRAX 8.5% / 0.8%  . GERD (gastroesophageal reflux disease)   . Hypertension    Discharge Diagnoses:   Principal Problem:   OA (osteoarthritis) of knee  Estimated body mass index is 33.66 kg/(m^2) as calculated from the following:   Height as of this encounter: '4\' 10"'$  (1.473 m).   Weight as of this encounter: 73.029 kg (161 lb).  Procedure:  Procedure(s) (LRB): UNICOMPARTMENTAL KNEE MEDIAL REPLACMENT (Right)   Consults: None  HPI: Robin Francis is a 69 y.o. female, who has  significant isolated medial compartment arthritis of the Right knee. She has had nonoperative management including injections. She has had  cortisone and viscous supplements. Unfortunately, the pain persists.  Radiograph showed isolated medial compartment bone-on-bone arthritis  with normal-appearing patellofemoral and lateral compartments. She  presents now for right knee unicompartmental arthroplasty.   Laboratory Data: Admission on 04/19/2015  Component Date Value Ref Range Status  . WBC 04/20/2015 14.9* 4.0 - 10.5 K/uL Final  . RBC 04/20/2015 3.88  3.87 - 5.11 MIL/uL Final  . Hemoglobin 04/20/2015 11.6* 12.0 - 15.0 g/dL Final  . HCT 04/20/2015 33.3* 36.0 - 46.0 % Final  . MCV 04/20/2015 85.8  78.0 - 100.0 fL Final  . MCH 04/20/2015 29.9  26.0 - 34.0 pg Final  . MCHC 04/20/2015 34.8  30.0 - 36.0 g/dL Final  . RDW 04/20/2015 12.5  11.5 - 15.5 % Final  . Platelets 04/20/2015 322  150 - 400 K/uL Final  . Sodium 04/20/2015 135  135 - 145 mmol/L Final  . Potassium 04/20/2015 3.9  3.5 - 5.1 mmol/L Final  . Chloride 04/20/2015 96* 101 - 111 mmol/L Final    . CO2 04/20/2015 28  22 - 32 mmol/L Final  . Glucose, Bld 04/20/2015 135* 65 - 99 mg/dL Final  . BUN 04/20/2015 8  6 - 20 mg/dL Final  . Creatinine, Ser 04/20/2015 0.58  0.44 - 1.00 mg/dL Final  . Calcium 04/20/2015 9.1  8.9 - 10.3 mg/dL Final  . GFR calc non Af Amer 04/20/2015 >60  >60 mL/min Final  . GFR calc Af Amer 04/20/2015 >60  >60 mL/min Final   Comment: (NOTE) The eGFR has been calculated using the CKD EPI equation. This calculation has not been validated in all clinical situations. eGFR's persistently <60 mL/min signify possible Chronic Kidney Disease.   Georgiann Hahn gap 04/20/2015 11  5 - 15 Final  Hospital Outpatient Visit on 04/13/2015  Component Date Value Ref Range Status  . aPTT 04/13/2015 38* 24 - 37 seconds Final   Comment:        IF BASELINE aPTT IS ELEVATED, SUGGEST PATIENT RISK ASSESSMENT BE USED TO DETERMINE APPROPRIATE ANTICOAGULANT THERAPY.   . WBC 04/13/2015 6.7  4.0 - 10.5 K/uL Final  . RBC 04/13/2015 4.25  3.87 - 5.11 MIL/uL Final  . Hemoglobin 04/13/2015 13.0  12.0 - 15.0 g/dL Final  . HCT 04/13/2015 38.5  36.0 - 46.0 % Final  . MCV 04/13/2015 90.6  78.0 - 100.0 fL Final  . MCH 04/13/2015 30.6  26.0 - 34.0 pg Final  . MCHC 04/13/2015 33.8  30.0 - 36.0 g/dL Final  . RDW 04/13/2015  12.6  11.5 - 15.5 % Final  . Platelets 04/13/2015 344  150 - 400 K/uL Final  . Sodium 04/13/2015 140  135 - 145 mmol/L Final  . Potassium 04/13/2015 4.5  3.5 - 5.1 mmol/L Final  . Chloride 04/13/2015 98* 101 - 111 mmol/L Final  . CO2 04/13/2015 29  22 - 32 mmol/L Final  . Glucose, Bld 04/13/2015 120* 65 - 99 mg/dL Final  . BUN 04/13/2015 18  6 - 20 mg/dL Final  . Creatinine, Ser 04/13/2015 0.85  0.44 - 1.00 mg/dL Final  . Calcium 04/13/2015 10.6* 8.9 - 10.3 mg/dL Final  . Total Protein 04/13/2015 7.4  6.5 - 8.1 g/dL Final  . Albumin 04/13/2015 4.4  3.5 - 5.0 g/dL Final  . AST 04/13/2015 26  15 - 41 U/L Final  . ALT 04/13/2015 29  14 - 54 U/L Final  . Alkaline Phosphatase  04/13/2015 67  38 - 126 U/L Final  . Total Bilirubin 04/13/2015 1.1  0.3 - 1.2 mg/dL Final  . GFR calc non Af Amer 04/13/2015 >60  >60 mL/min Final  . GFR calc Af Amer 04/13/2015 >60  >60 mL/min Final   Comment: (NOTE) The eGFR has been calculated using the CKD EPI equation. This calculation has not been validated in all clinical situations. eGFR's persistently <60 mL/min signify possible Chronic Kidney Disease.   . Anion gap 04/13/2015 13  5 - 15 Final  . Prothrombin Time 04/13/2015 14.5  11.6 - 15.2 seconds Final  . INR 04/13/2015 1.16  0.00 - 1.49 Final  . ABO/RH(D) 04/13/2015 B POS   Final  . Antibody Screen 04/13/2015 NEG   Final  . Sample Expiration 04/13/2015 04/22/2015   Final  . Extend sample reason 04/13/2015 NO TRANSFUSIONS OR PREGNANCY IN THE PAST 3 MONTHS   Final  . Color, Urine 04/13/2015 YELLOW  YELLOW Final  . APPearance 04/13/2015 CLEAR  CLEAR Final  . Specific Gravity, Urine 04/13/2015 1.010  1.005 - 1.030 Final  . pH 04/13/2015 7.5  5.0 - 8.0 Final  . Glucose, UA 04/13/2015 NEGATIVE  NEGATIVE mg/dL Final  . Hgb urine dipstick 04/13/2015 NEGATIVE  NEGATIVE Final  . Bilirubin Urine 04/13/2015 NEGATIVE  NEGATIVE Final  . Ketones, ur 04/13/2015 NEGATIVE  NEGATIVE mg/dL Final  . Protein, ur 04/13/2015 NEGATIVE  NEGATIVE mg/dL Final  . Nitrite 04/13/2015 NEGATIVE  NEGATIVE Final  . Leukocytes, UA 04/13/2015 NEGATIVE  NEGATIVE Final   MICROSCOPIC NOT DONE ON URINES WITH NEGATIVE PROTEIN, BLOOD, LEUKOCYTES, NITRITE, OR GLUCOSE <1000 mg/dL.  Marland Kitchen MRSA, PCR 04/13/2015 NEGATIVE  NEGATIVE Final  . Staphylococcus aureus 04/13/2015 NEGATIVE  NEGATIVE Final   Comment:        The Xpert SA Assay (FDA approved for NASAL specimens in patients over 68 years of age), is one component of a comprehensive surveillance program.  Test performance has been validated by [facility] for patients greater than or equal to 9 year old. It is not intended to diagnose infection nor to guide  or monitor treatment.      X-Rays:No results found.  EKG: Orders placed or performed in visit on 04/13/15  . EKG 12-Lead     Hospital Course: Robin Francis is a 70 y.o. who was admitted to [facility]. They were brought to the operating room on 04/19/2015 and underwent Procedure(s): UNICOMPARTMENTAL [place].  Patient tolerated the procedure well and was later transferred to the recovery room and then to the orthopaedic floor for postoperative care.  They were given PO and IV analgesics for pain control following their surgery.  They were given 24 hours of postoperative antibiotics of  Anti-infectives    Start     Dose/Rate Route Frequency Ordered Stop   04/19/15 1400  ceFAZolin (ANCEF) IVPB 2 g/50 mL premix     2 g 100 mL/hr over 30 Minutes Intravenous Every 6 hours 04/19/15 1102 04/19/15 2136   04/19/15 0625  ceFAZolin (ANCEF) IVPB 2 g/50 mL premix     2 g 100 mL/hr over 30 Minutes Intravenous On call to O.R. 04/19/15 1443 04/19/15 0804     and started on DVT prophylaxis in the form of Xarelto.   PT and OT were ordered for postop therapy protocol.  Discharge planning consulted to help with postop disposition and equipment needs.  Patient had a good night on the evening of surgery.  They started to get up OOB with therapy on day one. Hemovac drain was pulled without difficulty.  Patient was seen in rounds on day one and it was felt that as long as they did well with the remaining sessions of therapy that they would be ready to go home.  Arrangements were made and they were setup to go home on POD 1.  Discharge home with home health Diet - Cardiac diet Follow up - in 2 weeks Activity - WBAT Dressing - May remove the surgical dressing tomorrow at home and then apply a dry gauze dressing daily. May shower three days following surgery but do not submerge the incision under water. Disposition - Home Condition Upon Discharge - Good D/C Meds - See DC Summary DVT  Prophylaxis Xarelto 10 mg daily for ten days, then change to Aspirin 325 mg daily for two weeks, then reduce to Baby Aspirin 81 mg daily for three additional weeks.  Discharge Instructions    Call MD / Call 911    Complete by:  As directed   If you experience chest pain or shortness of breath, CALL 911 and be transported to the hospital emergency room.  If you develope a fever above 101 F, pus (white drainage) or increased drainage or redness at the wound, or calf pain, call your surgeon's office.     Change dressing    Complete by:  As directed   Change dressing daily with sterile 4 x 4 inch gauze dressing and apply TED hose. Do not submerge the incision under water.     Constipation Prevention    Complete by:  As directed   Drink plenty of fluids.  Prune juice may be helpful.  You may use a stool softener, such as Colace (over the counter) 100 mg twice a day.  Use MiraLax (over the counter) for constipation as needed.     Diet general    Complete by:  As directed      Discharge instructions    Complete by:  As directed   Pick up stool softner and laxative for home use following surgery while on pain medications. Do not submerge incision under water. May remove the surgical dressing tomorrow, Wednesday 04/21/2015, and then apply a dry gauze dressing daily. Please use good hand washing techniques while changing dressing each day. May shower starting three days after surgery starting Thursday 04/22/2015. Please use a clean towel to pat the incision dry following showers. Continue to use ice for pain and swelling after surgery. Do not use any lotions or creams on the incision until instructed by your surgeon.  Postoperative Constipation  Protocol  Constipation - defined medically as fewer than three stools per week and severe constipation as less than one stool per week.  One of the most common issues patients have following surgery is constipation. Even if you have a regular bowel pattern  at home, your normal regimen is likely to be disrupted due to multiple reasons following surgery. Combination of anesthesia, postoperative narcotics, change in appetite and fluid intake all can affect your bowels. In order to avoid complications following surgery, here are some recommendations in order to help you during your recovery period.  Colace (docusate) - Pick up an over-the-counter form of Colace or another stool softener and take twice a day as long as you are requiring postoperative pain medications. Take with a full glass of water daily. If you experience loose stools or diarrhea, hold the colace until you stool forms back up. If your symptoms do not get better within 1 week or if they get worse, check with your doctor.  Dulcolax (bisacodyl) - Pick up over-the-counter and take as directed by the product packaging as needed to assist with the movement of your bowels. Take with a full glass of water. Use this product as needed if not relieved by Colace only.   MiraLax (polyethylene glycol) - Pick up over-the-counter to have on hand. MiraLax is a solution that will increase the amount of water in your bowels to assist with bowel movements. Take as directed and can mix with a glass of water, juice, soda, coffee, or tea. Take if you go more than two days without a movement. Do not use MiraLax more than once per day. Call your doctor if you are still constipated or irregular after using this medication for 7 days in a row.  If you continue to have problems with postoperative constipation, please contact the office for further assistance and recommendations. If you experience "the worst abdominal pain ever" or develop nausea or vomiting, please contact the office immediatly for further recommendations for treatment.  Xarelto 10 mg daily for ten days, then change to Aspirin 325 mg daily for two weeks, then reduce to Baby Aspirin 81 mg daily for three additional weeks.     Do not put a  pillow under the knee. Place it under the heel.    Complete by:  As directed      Do not sit on low chairs, stoools or toilet seats, as it may be difficult to get up from low surfaces    Complete by:  As directed      Driving restrictions    Complete by:  As directed   No driving until released by the physician.     Increase activity slowly as tolerated    Complete by:  As directed      Lifting restrictions    Complete by:  As directed   No lifting until released by the physician.     Patient may shower    Complete by:  As directed   You may shower without a dressing once there is no drainage.  Do not wash over the wound.  If drainage remains, do not shower until drainage stops.     TED hose    Complete by:  As directed   Use stockings (TED hose) for 3 weeks on both leg(s).  You may remove them at night for sleeping.     Weight bearing as tolerated    Complete by:  As directed   Laterality:  right  Extremity:  Lower            Medication List    STOP taking these medications        ALOE VERA PO     b complex vitamins capsule     CALCIUM + D PO     CINNAMON PO     Co Q-10 120 MG Caps     diclofenac sodium 1 % Gel  Commonly known as:  VOLTAREN     fish oil-omega-3 fatty acids 1000 MG capsule     multivitamin tablet     Red Yeast Rice 600 MG Caps     TURMERIC PO     VITAMIN C PO     VITAMIN E PO      TAKE these medications        acetaminophen 650 MG CR tablet  Commonly known as:  TYLENOL  Take 1,300 mg by mouth every morning.     cetirizine 10 MG tablet  Commonly known as:  ZYRTEC  Take 10 mg by mouth daily.     losartan-hydrochlorothiazide 100-25 MG tablet  Commonly known as:  HYZAAR  TAKE 1 TABLET BY MOUTH EVERY DAY     methocarbamol 500 MG tablet  Commonly known as:  ROBAXIN  Take 1 tablet (500 mg total) by mouth every 6 (six) hours as needed for muscle spasms.     oxyCODONE 5 MG immediate release tablet  Commonly known as:  Oxy  IR/ROXICODONE  Take 1-2 tablets (5-10 mg total) by mouth every 3 (three) hours as needed for moderate pain or severe pain.     pantoprazole 20 MG tablet  Commonly known as:  PROTONIX  Take 20 mg by mouth daily.     pantoprazole 40 MG tablet  Commonly known as:  PROTONIX  Take 1 tablet (40 mg total) by mouth daily.     rivaroxaban 10 MG Tabs tablet  Commonly known as:  XARELTO  Take 1 tablet (10 mg total) by mouth daily with breakfast. Xarelto 10 mg daily for ten days, then change to Aspirin 325 mg daily for two weeks, then reduce to Baby Aspirin 81 mg daily for three additional weeks.     traMADol 50 MG tablet  Commonly known as:  ULTRAM  Take 1-2 tablets (50-100 mg total) by mouth every 6 (six) hours as needed (mild pain).           Follow-up Information    Follow up with Gearlean Alf, MD. Schedule an appointment as soon as possible for a visit on 05/04/2015.   Specialty:  Orthopedic Surgery   Why:  Call office at [phone] to setup appointment on Tuesday 05/04/2015 with Dr. Wynelle Link.   Contact information:   [address] [place] [address] [phone]       Signed: Arlee Muslim, PA-C Orthopaedic Surgery 04/20/2015, 8:43 AM

## 2015-04-20 NOTE — Care Management Note (Signed)
Case Management Note  Patient Details  Name: Robin Francis MRN: [id number] Date of Birth: 02/05/1946  Subjective/Objective:                  Medial compartment osteoarthritis, Right knee Action/Plan: Discharge planning Expected Discharge Date:  04/20/15               Expected Discharge Plan:  Home/Self Care  In-House Referral:     Discharge planning Services  CM Consult  Post Acute Care Choice:    Choice offered to:  Patient  DME Arranged:  N/A DME Agency:  NA  HH Arranged:  NA HH Agency:  NA  Status of Service:  Completed, signed off  Medicare Important Message Given:    Date Medicare IM Given:    Medicare IM give by:    Date Additional Medicare IM Given:    Additional Medicare Important Message give by:     If discussed at Long Length of Stay Meetings, dates discussed:    Additional Comments: CM met with pt in room to confirm plan to go straight to outpt therapy.  Pt confirms.  Pt has both rolling walker and asst commode at home.  No other CM needs were communicated. Jeffries, Sarah Christine, RN 04/20/2015, 10:58 AM  

## 2015-04-20 NOTE — Progress Notes (Signed)
OT Cancellation Note  Patient Details Name: Robin Francis MRN: [id number] DOB: March 14, 1945   Cancelled Treatment:    Reason Eval/Treat Not Completed: OT screened, no needs identified, will sign off.  Pt has all DME and assist at home.  No OT needs  Knox Holdman 04/20/2015, 10:31 AM  Lesle Chris, OTR/L [phone] 04/20/2015

## 2015-04-20 NOTE — Progress Notes (Signed)
   Subjective: 1 Day Post-Op Procedure(s) (LRB): UNICOMPARTMENTAL KNEE MEDIAL REPLACMENT (Right) Patient reports pain as mild.   Patient seen in rounds with Dr. Wynelle Link.  She walked 60 feet day of surgery. Patient is well, and has had no acute complaints or problems Patient is ready to go home following morning session of therapy.  Objective: Vital signs in last 24 hours: Temp:  [97.3 F (36.3 C)-98.3 F (36.8 C)] 97.5 F (36.4 C) (03/14 0604) Pulse Rate:  [65-87] 73 (03/14 0604) Resp:  [10-16] 16 (03/14 0604) BP: (108-147)/(68-92) 108/92 mmHg (03/14 0604) SpO2:  [95 %-100 %] 97 % (03/14 0604)  Intake/Output from previous day:  Intake/Output Summary (Last 24 hours) at 04/20/15 0836 Last data filed at 04/20/15 0605  Gross per 24 hour  Intake 3733.75 ml  Output   2655 ml  Net 1078.75 ml    Labs:  Recent Labs  04/20/15 0406  HGB 11.6*    Recent Labs  04/20/15 0406  WBC 14.9*  RBC 3.88  HCT 33.3*  PLT 322    Recent Labs  04/20/15 0406  NA 135  K 3.9  CL 96*  CO2 28  BUN 8  CREATININE 0.58  GLUCOSE 135*  CALCIUM 9.1   No results for input(s): LABPT, INR in the last 72 hours.  EXAM: General - Patient is Alert, Appropriate and Oriented Extremity - Neurovascular intact Sensation intact distally Dorsiflexion/Plantar flexion intact Dressing - clean, dry, no drainage Motor Function - intact, moving foot and toes well on exam.  Hemovac pulled without difficulty.  Assessment/Plan: 1 Day Post-Op Procedure(s) (LRB): UNICOMPARTMENTAL KNEE MEDIAL REPLACMENT (Right) Procedure(s) (LRB): UNICOMPARTMENTAL KNEE MEDIAL REPLACMENT (Right) Past Medical History  Diagnosis Date  . OSTEOARTHRITIS 09/17/2008  . Osteopenia 11/2014    T score -1.4 FRAX 8.5% / 0.8%  . GERD (gastroesophageal reflux disease)   . Hypertension    Principal Problem:   OA (osteoarthritis) of knee  Estimated body mass index is 33.66 kg/(m^2) as calculated from the following:   Height as  of this encounter: 4\' 10"  (1.473 m).   Weight as of this encounter: 73.029 kg (161 lb). Advance diet Up with therapy Discharge home with home health Diet - Cardiac diet Follow up - in 2 weeks Activity - WBAT Dressing - May remove the surgical dressing tomorrow at home and then apply a dry gauze dressing daily. May shower three days following surgery but do not submerge the incision under water. Disposition - Home Condition Upon Discharge - Good D/C Meds - See DC Summary DVT Prophylaxis Xarelto 10 mg daily for ten days, then change to Aspirin 325 mg daily for two weeks, then reduce to Baby Aspirin 81 mg daily for three additional weeks.  Arlee Muslim, PA-C Orthopaedic Surgery 04/20/2015, 8:36 AM

## 2015-04-20 NOTE — Progress Notes (Signed)
Physical Therapy Treatment Patient Details Name: Robin Francis MRN: [id number] DOB: January 09, 1946 Today's Date: 04/20/2015    History of Present Illness Pt is a 70 year old female s/p right knee medial unicompartmental arthroplasty    PT Comments    Pt ambulated in hallway and practiced one step.  Pt also performed LE exercises.  Pt feels ready for d/c home and had no further questions.  Follow Up Recommendations  Home health PT     Equipment Recommendations  None recommended by PT    Recommendations for Other Services       Precautions / Restrictions Precautions Precautions: Knee Required Braces or Orthoses: Knee Immobilizer - Right Restrictions Weight Bearing Restrictions: No Other Position/Activity Restrictions: WBAT    Mobility  Bed Mobility Overal bed mobility: Needs Assistance Bed Mobility: Supine to Sit     Supine to sit: HOB elevated;Supervision     General bed mobility comments: verbal cues for self assist  Transfers Overall transfer level: Needs assistance Equipment used: Rolling walker (2 wheeled) Transfers: Sit to/from Stand Sit to Stand: Min guard         General transfer comment: verbal cues for UE and LE positioning  Ambulation/Gait Ambulation/Gait assistance: Min guard Ambulation Distance (Feet): 160 Feet Assistive device: Rolling walker (2 wheeled) Gait Pattern/deviations: Step-through pattern;Decreased stride length;Antalgic     General Gait Details: verbal cues for RW distance and posture   Stairs Stairs: Yes Stairs assistance: Min guard Stair Management: Step to pattern;Forwards;With walker Number of Stairs: 1 General stair comments: verbal cues for sequence and safety, pt performed well and did not have any questions  Wheelchair Mobility    Modified Rankin (Stroke Patients Only)       Balance                                    Cognition Arousal/Alertness: Awake/alert Behavior During Therapy: WFL for  tasks assessed/performed Overall Cognitive Status: Within Functional Limits for tasks assessed                      Exercises Total Joint Exercises Ankle Circles/Pumps: AROM;Both;10 reps Quad Sets: AROM;Both;10 reps Short Arc Quad: AROM;Right;10 reps Hip ABduction/ADduction: AROM;Right;10 reps Straight Leg Raises: AAROM;Right;10 reps Goniometric ROM: knee flexion AAROM 55* in recliner    General Comments        Pertinent Vitals/Pain Pain Assessment: 0-10 Pain Score: 3  Pain Location: R knee Pain Descriptors / Indicators: Aching;Sore Pain Intervention(s): Limited activity within patient's tolerance;Monitored during session;Repositioned;Ice applied    Home Living                      Prior Function            PT Goals (current goals can now be found in the care plan section) Progress towards PT goals: Progressing toward goals    Frequency  7X/week    PT Plan Current plan remains appropriate    Co-evaluation             End of Session Equipment Utilized During Treatment: Gait belt Activity Tolerance: Patient tolerated treatment well Patient left: in chair;with call bell/phone within reach;with chair alarm set     Time: [phone] PT Time Calculation (min) (ACUTE ONLY): 18 min  Charges:  $Gait Training: 8-22 mins  G Codes:      Tangi Shroff,KATHrine E 26-Apr-2015, 10:59 AM Carmelia Bake, PT, DPT 26-Apr-2015 Pager: [phone]

## 2015-04-20 NOTE — Discharge Instructions (Addendum)
° °Dr. Frank Aluisio °Total Joint Specialist °[facility] °[address] °[place], [place] [address] °[phone] ° °UNI KNEE REPLACEMENT POSTOPERATIVE DIRECTIONS ° ° °Knee Rehabilitation, Guidelines Following Surgery  °Results after knee surgery are often greatly improved when you follow the exercise, range of motion and muscle strengthening exercises prescribed by your doctor. Safety measures are also important to protect the knee from further injury. Any time any of these exercises cause you to have increased pain or swelling in your knee joint, decrease the amount until you are comfortable again and slowly increase them. If you have problems or questions, call your caregiver or physical therapist for advice.  ° °HOME CARE INSTRUCTIONS  °Remove items at home which could result in a fall. This includes throw rugs or furniture in walking pathways.  °· ICE to the affected knee every three hours for 30 minutes at a time and then as needed for pain and swelling.  Continue to use ice on the knee for pain and swelling from surgery. You may notice swelling that will progress down to the foot and ankle.  This is normal after surgery.  Elevate the leg when you are not up walking on it.   °· Continue to use the breathing machine which will help keep your temperature down.  It is common for your temperature to cycle up and down following surgery, especially at night when you are not up moving around and exerting yourself.  The breathing machine keeps your lungs expanded and your temperature down. °· Do not place pillow under knee, focus on keeping the knee straight while resting ° °DIET °You may resume your previous home diet once your are discharged from the hospital. ° °DRESSING / WOUND CARE / SHOWERING °You may shower 3 days after surgery, but keep the wounds dry during showering.  You may use an occlusive plastic wrap (Press'n Seal for example), NO SOAKING/SUBMERGING IN THE BATHTUB.  If the  bandage gets wet, change with a clean dry gauze.  If the incision gets wet, pat the wound dry with a clean towel. °You may start showering once you are discharged home but do not submerge the incision under water. Just pat the incision dry and apply a dry gauze dressing on daily. °Change the surgical dressing daily and reapply a dry dressing each time. ° °ACTIVITY °Walk with your walker as instructed. °Use walker as long as suggested by your caregivers. °Avoid periods of inactivity such as sitting longer than an hour when not asleep. This helps prevent blood clots.  °You may resume a sexual relationship in one month or when given the OK by your doctor.  °You may return to work once you are cleared by your doctor.  °Do not drive a car for 6 weeks or until released by you surgeon.  °Do not drive while taking narcotics. ° °WEIGHT BEARING °Weight bearing as tolerated with assist device (walker, cane, etc) as directed, use it as long as suggested by your surgeon or therapist, typically at least 4-6 weeks. ° °POSTOPERATIVE CONSTIPATION PROTOCOL °Constipation - defined medically as fewer than three stools per week and severe constipation as less than one stool per week. ° °One of the most common issues patients have following surgery is constipation.  Even if you have a regular bowel pattern at home, your normal regimen is likely to be disrupted due to multiple reasons following surgery.  Combination of anesthesia, postoperative narcotics, change in appetite and fluid intake all can affect your bowels.    In order to avoid complications following surgery, here are some recommendations in order to help you during your recovery period.  Colace (docusate) - Pick up an over-the-counter form of Colace or another stool softener and take twice a day as long as you are requiring postoperative pain medications.  Take with a full glass of water daily.  If you experience loose stools or diarrhea, hold the colace until you stool forms  back up.  If your symptoms do not get better within 1 week or if they get worse, check with your doctor.  Dulcolax (bisacodyl) - Pick up over-the-counter and take as directed by the product packaging as needed to assist with the movement of your bowels.  Take with a full glass of water.  Use this product as needed if not relieved by Colace only.   MiraLax (polyethylene glycol) - Pick up over-the-counter to have on hand.  MiraLax is a solution that will increase the amount of water in your bowels to assist with bowel movements.  Take as directed and can mix with a glass of water, juice, soda, coffee, or tea.  Take if you go more than two days without a movement. Do not use MiraLax more than once per day. Call your doctor if you are still constipated or irregular after using this medication for 7 days in a row.  If you continue to have problems with postoperative constipation, please contact the office for further assistance and recommendations.  If you experience "the worst abdominal pain ever" or develop nausea or vomiting, please contact the office immediatly for further recommendations for treatment.  ITCHING  If you experience itching with your medications, try taking only a single pain pill, or even half a pain pill at a time.  You can also use Benadryl over the counter for itching or also to help with sleep.   TED HOSE STOCKINGS Wear the elastic stockings on both legs for three weeks following surgery during the day but you may remove then at night for sleeping.  MEDICATIONS See your medication summary on the After Visit Summary that the nursing staff will review with you prior to discharge.  You may have some home medications which will be placed on hold until you complete the course of blood thinner medication.  It is important for you to complete the blood thinner medication as prescribed by your surgeon.  Continue your approved medications as instructed at time of  discharge.  PRECAUTIONS If you experience chest pain or shortness of breath - call 911 immediately for transfer to the hospital emergency department.  If you develop a fever greater that 101 F, purulent drainage from wound, increased redness or drainage from wound, foul odor from the wound/dressing, or calf pain - CONTACT YOUR SURGEON.                                                   FOLLOW-UP APPOINTMENTS Make sure you keep all of your appointments after your operation with your surgeon and caregivers. You should call the office at the above phone number and make an appointment for approximately two weeks after the date of your surgery or on the date instructed by your surgeon outlined in the "After Visit Summary".  RANGE OF MOTION AND STRENGTHENING EXERCISES  Rehabilitation of the knee is important following a knee injury or an  operation. After just a few days of immobilization, the muscles of the thigh which control the knee become weakened and shrink (atrophy). Knee exercises are designed to build up the tone and strength of the thigh muscles and to improve knee motion. Often times heat used for twenty to thirty minutes before working out will loosen up your tissues and help with improving the range of motion but do not use heat for the first two weeks following surgery. These exercises can be done on a training (exercise) mat, on the floor, on a table or on a bed. Use what ever works the best and is most comfortable for you Knee exercises include:  Leg Lifts - While your knee is still immobilized in a splint or cast, you can do straight leg raises. Lift the leg to 60 degrees, hold for 3 sec, and slowly lower the leg. Repeat 10-20 times 2-3 times daily. Perform this exercise against resistance later as your knee gets better.  Quad and Hamstring Sets - Tighten up the muscle on the front of the thigh (Quad) and hold for 5-10 sec. Repeat this 10-20 times hourly. Hamstring sets are done by pushing the  foot backward against an object and holding for 5-10 sec. Repeat as with quad sets.   Leg Slides: Lying on your back, slowly slide your foot toward your buttocks, bending your knee up off the floor (only go as far as is comfortable). Then slowly slide your foot back down until your leg is flat on the floor again.  Angel Wings: Lying on your back spread your legs to the side as far apart as you can without causing discomfort.  A rehabilitation program following serious knee injuries can speed recovery and prevent re-injury in the future due to weakened muscles. Contact your doctor or a physical therapist for more information on knee rehabilitation.   IF YOU ARE TRANSFERRED TO A SKILLED REHAB FACILITY If the patient is transferred to a skilled rehab facility following release from the hospital, a list of the current medications will be sent to the facility for the patient to continue.  When discharged from the skilled rehab facility, please have the facility set up the patient's [place] prior to being released. Also, the skilled facility will be responsible for providing the patient with their medications at time of release from the facility to include their pain medication, the muscle relaxants, and their blood thinner medication. If the patient is still at the rehab facility at time of the two week follow up appointment, the skilled rehab facility will also need to assist the patient in arranging follow up appointment in our office and any transportation needs.  MAKE SURE YOU:  Understand these instructions.  Get help right away if you are not doing well or get worse.    Pick up stool softner and laxative for home use following surgery while on pain medications. Do not submerge incision under water. Please use good hand washing techniques while changing dressing each day. May shower starting three days after surgery. Please use a clean towel to pat the incision dry following  showers. Continue to use ice for pain and swelling after surgery. Do not use any lotions or creams on the incision until instructed by your surgeon.   Take Xarelto 10 mg daily for ten days, then change to Aspirin 325 mg daily for two weeks, then reduce to Baby Aspirin 81 mg daily for three additional weeks.   Information on my medicine -  XARELTO (Rivaroxaban)  This medication education was reviewed with me or my healthcare representative as part of my discharge preparation.  The pharmacist that spoke with me during my hospital stay was:  Clovis Riley, [facility]  Why was Xarelto prescribed for you? Xarelto was prescribed for you to reduce the risk of blood clots forming after orthopedic surgery. The medical term for these abnormal blood clots is venous thromboembolism (VTE).  What do you need to know about xarelto ? Take your Xarelto ONCE DAILY at the same time every day. You may take it either with or without food.  If you have difficulty swallowing the tablet whole, you may crush it and mix in applesauce just prior to taking your dose.  Take Xarelto exactly as prescribed by your doctor and DO NOT stop taking Xarelto without talking to the doctor who prescribed the medication.  Stopping without other VTE prevention medication to take the place of Xarelto may increase your risk of developing a clot.  After discharge, you should have regular check-up appointments with your healthcare provider that is prescribing your Xarelto.    What do you do if you miss a dose? If you miss a dose, take it as soon as you remember on the same day then continue your regularly scheduled once daily regimen the next day. Do not take two doses of Xarelto on the same day.   Important Safety Information A possible side effect of Xarelto is bleeding. You should call your healthcare provider right away if you experience any of the following: ? Bleeding from an injury or your nose that does not  stop. ? Unusual colored urine (red or dark brown) or unusual colored stools (red or black). ? Unusual bruising for unknown reasons. ? A serious fall or if you hit your head (even if there is no bleeding).  Some medicines may interact with Xarelto and might increase your risk of bleeding while on Xarelto. To help avoid this, consult your healthcare provider or pharmacist prior to using any new prescription or non-prescription medications, including herbals, vitamins, non-steroidal anti-inflammatory drugs (NSAIDs) and supplements.  This website has more information on Xarelto: [email or web address]

## 2015-07-16 ENCOUNTER — Encounter: Payer: Self-pay | Admitting: Gynecology

## 2015-08-19 ENCOUNTER — Other Ambulatory Visit: Payer: Self-pay | Admitting: Internal Medicine

## 2015-09-16 ENCOUNTER — Other Ambulatory Visit: Payer: Self-pay | Admitting: Internal Medicine

## 2015-11-05 ENCOUNTER — Ambulatory Visit (INDEPENDENT_AMBULATORY_CARE_PROVIDER_SITE_OTHER): Payer: Medicare Other | Admitting: Gynecology

## 2015-11-05 ENCOUNTER — Encounter: Payer: Self-pay | Admitting: Gynecology

## 2015-11-05 VITALS — BP 124/70 | Ht <= 58 in | Wt 158.0 lb

## 2015-11-05 DIAGNOSIS — M8580 Other specified disorders of bone density and structure, unspecified site: Secondary | ICD-10-CM

## 2015-11-05 DIAGNOSIS — Z01419 Encounter for gynecological examination (general) (routine) without abnormal findings: Secondary | ICD-10-CM | POA: Diagnosis not present

## 2015-11-05 DIAGNOSIS — N952 Postmenopausal atrophic vaginitis: Secondary | ICD-10-CM

## 2015-11-05 NOTE — Patient Instructions (Signed)
Have your primary doctor check your vitamin D level with your next blood work.

## 2015-11-05 NOTE — Progress Notes (Signed)
    Robin Francis 02/21/1945 [id number]        70 y.o.  G2P2002  for breast and pelvic exam.  Past medical history,surgical history, problem list, medications, allergies, family history and social history were all reviewed and documented as reviewed in the EPIC chart.  ROS:  Performed with pertinent positives and negatives included in the history, assessment and plan.   Additional significant findings :  none   Exam: Caryn Bee assistant Vitals:   11/05/15 1415  BP: 124/70  Weight: 158 lb (71.7 kg)  Height: 4\' 9"  (1.448 m)   Body mass index is 34.19 kg/m.  General appearance:  Normal affect, orientation and appearance. Skin: Grossly normal HEENT: Without gross lesions.  No cervical or supraclavicular adenopathy. Thyroid normal.  Lungs:  Clear without wheezing, rales or rhonchi Cardiac: RR, without RMG Abdominal:  Soft, nontender, without masses, guarding, rebound, organomegaly or hernia Breasts:  Examined lying and sitting without masses, retractions, discharge or axillary adenopathy. Pelvic:  Ext, BUS, Vagina with atrophic changes  Cervix with atrophic changes  Uterus anteverted, normal size, shape and contour, midline and mobile nontender   Adnexa without masses or tenderness    Anus and perineum normal   Rectovaginal normal sphincter tone without palpated masses or tenderness.    Assessment/Plan:  70 y.o. [id number] female for breast and pelvic exam  1. Postmenopausal/atrophic genital changes. No significant hot flushes, night sweats, vaginal dryness or any vaginal bleeding. Continue monitor report any issues or bleeding. 2. Osteopenia. DEXA 11/2014 T score -1.4 FRAX 8.5%/0.8%. I asked patient to have a vitamin D level checked at her primary physician's office the next time she has a blood draw. Plan on repeating the DEXA next year at 2 year interval. 3. Pap smear 2015. No Pap smear done today. Plan repeat Pap smear next year at 3 year interval. Options to stop screening  also discussed based on age and current screening guidelines. 4. Colonoscopy 2008 with reported interval 10 years. 5. Mammography 06/2015. Continue with annual mammography when due. SBE monthly reviewed. 6. Health maintenance. No routine lab work done as patient does this at her primary physician's office. Follow up 1 year, sooner as needed.   Anastasio Auerbach MD, 2:40 PM 11/05/2015

## 2015-11-20 ENCOUNTER — Other Ambulatory Visit: Payer: Self-pay | Admitting: Internal Medicine

## 2015-11-22 ENCOUNTER — Other Ambulatory Visit: Payer: Self-pay

## 2015-12-09 ENCOUNTER — Other Ambulatory Visit (INDEPENDENT_AMBULATORY_CARE_PROVIDER_SITE_OTHER): Payer: Medicare Other

## 2015-12-09 DIAGNOSIS — Z0000 Encounter for general adult medical examination without abnormal findings: Secondary | ICD-10-CM

## 2015-12-09 DIAGNOSIS — E559 Vitamin D deficiency, unspecified: Secondary | ICD-10-CM | POA: Diagnosis not present

## 2015-12-09 LAB — CBC WITH DIFFERENTIAL/PLATELET
BASOS PCT: 0.6 % (ref 0.0–3.0)
Basophils Absolute: 0.1 10*3/uL (ref 0.0–0.1)
EOS PCT: 1.5 % (ref 0.0–5.0)
Eosinophils Absolute: 0.1 10*3/uL (ref 0.0–0.7)
HCT: 40 % (ref 36.0–46.0)
Hemoglobin: 13.7 g/dL (ref 12.0–15.0)
LYMPHS ABS: 2.6 10*3/uL (ref 0.7–4.0)
Lymphocytes Relative: 31.8 % (ref 12.0–46.0)
MCHC: 34.2 g/dL (ref 30.0–36.0)
MCV: 90 fl (ref 78.0–100.0)
MONO ABS: 0.7 10*3/uL (ref 0.1–1.0)
MONOS PCT: 8.6 % (ref 3.0–12.0)
NEUTROS PCT: 57.5 % (ref 43.0–77.0)
Neutro Abs: 4.6 10*3/uL (ref 1.4–7.7)
Platelets: 298 10*3/uL (ref 150.0–400.0)
RBC: 4.44 Mil/uL (ref 3.87–5.11)
RDW: 13.2 % (ref 11.5–15.5)
WBC: 8.1 10*3/uL (ref 4.0–10.5)

## 2015-12-09 LAB — POC URINALSYSI DIPSTICK (AUTOMATED)
Bilirubin, UA: NEGATIVE
Blood, UA: NEGATIVE
Glucose, UA: NEGATIVE
Ketones, UA: NEGATIVE
NITRITE UA: NEGATIVE
PH UA: 7
PROTEIN UA: NEGATIVE
Spec Grav, UA: 1.01
Urobilinogen, UA: 0.2

## 2015-12-09 LAB — BASIC METABOLIC PANEL
BUN: 15 mg/dL (ref 6–23)
CHLORIDE: 101 meq/L (ref 96–112)
CO2: 32 meq/L (ref 19–32)
Calcium: 10.1 mg/dL (ref 8.4–10.5)
Creatinine, Ser: 0.8 mg/dL (ref 0.40–1.20)
GFR: 75.68 mL/min (ref >60.00)
GLUCOSE: 97 mg/dL (ref 70–99)
POTASSIUM: 3.7 meq/L (ref 3.5–5.1)
SODIUM: 142 meq/L (ref 135–145)

## 2015-12-09 LAB — HEPATIC FUNCTION PANEL
ALBUMIN: 4.4 g/dL (ref 3.5–5.2)
ALT: 26 U/L (ref 0–35)
AST: 26 U/L (ref 0–37)
Alkaline Phosphatase: 72 U/L (ref 39–117)
BILIRUBIN TOTAL: 0.9 mg/dL (ref 0.2–1.2)
Bilirubin, Direct: 0.1 mg/dL (ref 0.0–0.3)
Total Protein: 7.1 g/dL (ref 6.0–8.3)

## 2015-12-09 LAB — TSH: TSH: 2.63 u[IU]/mL (ref 0.35–4.50)

## 2015-12-09 LAB — LIPID PANEL
CHOLESTEROL: 245 mg/dL — AB (ref 0–200)
HDL: 72.3 mg/dL (ref >39.00)
LDL Cholesterol: 156 mg/dL — ABNORMAL HIGH (ref 0–99)
NonHDL: 172.54
TRIGLYCERIDES: 84 mg/dL (ref 0.0–149.0)
Total CHOL/HDL Ratio: 3
VLDL: 16.8 mg/dL (ref 0.0–40.0)

## 2015-12-09 LAB — VITAMIN D 25 HYDROXY (VIT D DEFICIENCY, FRACTURES): VITD: 45.99 ng/mL (ref 30.00–100.00)

## 2015-12-20 ENCOUNTER — Ambulatory Visit (INDEPENDENT_AMBULATORY_CARE_PROVIDER_SITE_OTHER): Payer: Medicare Other | Admitting: Internal Medicine

## 2015-12-20 ENCOUNTER — Encounter: Payer: Self-pay | Admitting: Internal Medicine

## 2015-12-20 VITALS — BP 122/82 | HR 74 | Temp 98.7°F | Resp 16 | Ht <= 58 in | Wt 161.4 lb

## 2015-12-20 DIAGNOSIS — Z0000 Encounter for general adult medical examination without abnormal findings: Secondary | ICD-10-CM

## 2015-12-20 DIAGNOSIS — Z23 Encounter for immunization: Secondary | ICD-10-CM

## 2015-12-20 NOTE — Progress Notes (Signed)
Pre visit review using our clinic review tool, if applicable. No additional management support is needed unless otherwise documented below in the visit note. 

## 2015-12-20 NOTE — Progress Notes (Signed)
Subjective:    Patient ID: Robin Francis, female    DOB: 19-Mar-1945, 70 y.o.   MRN: [id number]  HPI  70 year old patient who is seen today for a preventive health examination.  She enjoys excellent health.  Medical issues include hypertension, obesity gastro-soft, reflux disease.  She did have upper panendoscopy a few years ago.  She has challenge herself a number of times.  Off PPI therapy but with relapse of symptoms. Her last colonoscopy was about 9 years ago. She is followed by gynecology annually.  Past Medical History:  Diagnosis Date  . GERD (gastroesophageal reflux disease)   . Hypertension   . OSTEOARTHRITIS 09/17/2008  . Osteopenia 11/2014   T score -1.4 FRAX 8.5% / 0.8%     Social History   Social History  . Marital status: Married    Spouse name: N/A  . Number of children: N/A  . Years of education: N/A   Occupational History  . Not on file.   Social History Main Topics  . Smoking status: Never Smoker  . Smokeless tobacco: Never Used  . Alcohol use 2.4 oz/week    4 Standard drinks or equivalent per week     Comment: occassionally-social  . Drug use: No  . Sexual activity: Not Currently    Birth control/ protection: Post-menopausal     Comment: 1st intercourse 70 yo-Fewer than 5 partners   Other Topics Concern  . Not on file   Social History Narrative  . No narrative on file    Past Surgical History:  Procedure Laterality Date  . BACK SURGERY     LOWER  . KNEE ARTHROSCOPY     left  . PARTIAL KNEE ARTHROPLASTY Right 04/19/2015   Procedure: UNICOMPARTMENTAL KNEE MEDIAL REPLACMENT;  Surgeon: Gaynelle Arabian, MD;  Location: [facility];  Service: Orthopedics;  Laterality: Right;  . TONSILLECTOMY     as child  . TOTAL KNEE ARTHROPLASTY  04/03/2011   Procedure: TOTAL KNEE ARTHROPLASTY;  Surgeon: Gearlean Alf, MD;  Location: [facility];  Service: Orthopedics;  Laterality: Left;  . TRIGGER FINGER RELEASE Left     Family History  Problem Relation Age of Onset   . Pancreatic cancer Mother   . Breast cancer Maternal Grandmother     Age 68    Allergies  Allergen Reactions  . Lisinopril Other (See Comments)    Cough    Current Outpatient Prescriptions on File Prior to Visit  Medication Sig Dispense Refill  . acetaminophen (TYLENOL) 650 MG CR tablet Take 1,300 mg by mouth every morning.    . cetirizine (ZYRTEC) 10 MG tablet Take 10 mg by mouth daily.    Marland Kitchen losartan-hydrochlorothiazide (HYZAAR) 100-25 MG tablet TAKE 1 TABLET BY MOUTH EVERY DAY 90 tablet 0  . pantoprazole (PROTONIX) 40 MG tablet Take 1 tablet (40 mg total) by mouth daily. 90 tablet 3   No current facility-administered medications on file prior to visit.     BP 122/82 (BP Location: Right Arm, Patient Position: Sitting, Cuff Size: Normal)   Pulse 74   Temp 98.7 F (37.1 C) (Oral)   Resp 16   Ht 4\' 9"  (1.448 m)   Wt 161 lb 6.1 oz (73.2 kg)   SpO2 97%   BMI 34.92 kg/m   1. Risk factors, based on past  M,S,F history.  cardiovascular risk factors include a history of hypertension.  She does have a history mild dyslipidemia, but with a high HDL  2.  Physical activities:walks daily  and does some light weights 6 days per week 3.  Depression/mood:no history depression or mood disorder  4.  Hearing:no deficits  5.  ADL's:independent in all aspects of daily living  6.  Fall risk:low  7.  Home safety:no problems identified  8.  Height weight, and visual acuity;height and weight stable no change in visual acuity  9.  Counseling:  10. Lab orders based on risk factors:  11. Referral :not appropriate at this time  12. Care plan:continue efforts at aggressive risk factor modification  13. Cognitive assessment: alert in order with normal affect no cognitive dysfunction  14. Screening: Patient provided with a written and personalized 5-10 year screening schedule in the AVS.    15. Provider List Update: Primary care gynecology ophthalmology.  She is also seen periodically by  dermatology and gastroenterology    Review of Systems  Constitutional: Negative.   HENT: Negative for congestion, dental problem, hearing loss, rhinorrhea, sinus pressure, sore throat and tinnitus.   Eyes: Negative for pain, discharge and visual disturbance.  Respiratory: Negative for cough and shortness of breath.   Cardiovascular: Negative for chest pain, palpitations and leg swelling.  Gastrointestinal: Negative for abdominal distention, abdominal pain, blood in stool, constipation, diarrhea, nausea and vomiting.  Genitourinary: Negative for difficulty urinating, dysuria, flank pain, frequency, hematuria, pelvic pain, urgency, vaginal bleeding, vaginal discharge and vaginal pain.  Musculoskeletal: Negative for arthralgias, gait problem and joint swelling.  Skin: Negative for rash.  Neurological: Negative for dizziness, syncope, speech difficulty, weakness, numbness and headaches.  Hematological: Negative for adenopathy.  Psychiatric/Behavioral: Negative for agitation, behavioral problems and dysphoric mood. The patient is not nervous/anxious.        Objective:   Physical Exam  Constitutional: She is oriented to person, place, and time. She appears well-developed and well-nourished.  Weight 150 Blood pressure low normal  HENT:  Head: Normocephalic and atraumatic.  Right Ear: External ear normal.  Left Ear: External ear normal.  Mouth/Throat: Oropharynx is clear and moist.  Eyes: Conjunctivae and EOM are normal.  Neck: Normal range of motion. Neck supple. No JVD present. No thyromegaly present.  Cardiovascular: Normal rate, regular rhythm, normal heart sounds and intact distal pulses.   No murmur heard. Pulmonary/Chest: Effort normal and breath sounds normal. She has no wheezes. She has no rales.  Abdominal: Soft. Bowel sounds are normal. She exhibits no distension and no mass. There is no tenderness. There is no rebound and no guarding.  Genitourinary: Vagina normal.    Musculoskeletal: Normal range of motion. She exhibits no edema or tenderness.  Status post bilateral TKA  Neurological: She is alert and oriented to person, place, and time. She has normal reflexes. No cranial nerve deficit. She exhibits normal muscle tone. Coordination normal.  Skin: Skin is warm and dry. No rash noted.  Psychiatric: She has a normal mood and affect. Her behavior is normal.          Assessment & Plan:  .  Preventive health examination .  Immunizations up-to-date.  Flu vaccine administered .  Gastro-soft, reflux disease with stricture formation.  Continue PPI therapy .  Essential hypertension, stable  obesity.  Weight loss encouraged.   Nyoka Cowden

## 2015-12-20 NOTE — Patient Instructions (Addendum)
Limit your sodium (Salt) intake  Please check your blood pressure on a regular basis.  If it is consistently greater than 150/90, please make an office appointment.    It is important that you exercise regularly, at least 20 minutes 3 to 4 times per week.  If you develop chest pain or shortness of breath seek  medical attention.  Take a calcium supplement, plus [phone] units of vitamin D  Return in one year for follow-up  Preventive Care for Adults, Female A healthy lifestyle and preventive care can promote health and wellness. Preventive health guidelines for women include the following key practices.  A routine yearly physical is a good way to check with your health care provider about your health and preventive screening. It is a chance to share any concerns and updates on your health and to receive a thorough exam.  Visit your dentist for a routine exam and preventive care every 6 months. Brush your teeth twice a day and floss once a day. Good oral hygiene prevents tooth decay and gum disease.  The frequency of eye exams is based on your age, health, family medical history, use of contact lenses, and other factors. Follow your health care provider's recommendations for frequency of eye exams.  Eat a healthy diet. Foods like vegetables, fruits, whole grains, low-fat dairy products, and lean protein foods contain the nutrients you need without too many calories. Decrease your intake of foods high in solid fats, added sugars, and salt. Eat the right amount of calories for you.Get information about a proper diet from your health care provider, if necessary.  Regular physical exercise is one of the most important things you can do for your health. Most adults should get at least 150 minutes of moderate-intensity exercise (any activity that increases your heart rate and causes you to sweat) each week. In addition, most adults need muscle-strengthening exercises on 2 or more days a  week.  Maintain a healthy weight. The body mass index (BMI) is a screening tool to identify possible weight problems. It provides an estimate of body fat based on height and weight. Your health care provider can find your BMI and can help you achieve or maintain a healthy weight.For adults 20 years and older:  A BMI below 18.5 is considered underweight.  A BMI of 18.5 to 24.9 is normal.  A BMI of 25 to 29.9 is considered overweight.  A BMI of 30 and above is considered obese.  Maintain normal blood lipids and cholesterol levels by exercising and minimizing your intake of saturated fat. Eat a balanced diet with plenty of fruit and vegetables. Blood tests for lipids and cholesterol should begin at age 23 and be repeated every 5 years. If your lipid or cholesterol levels are high, you are over 50, or you are at high risk for heart disease, you may need your cholesterol levels checked more frequently.Ongoing high lipid and cholesterol levels should be treated with medicines if diet and exercise are not working.  If you smoke, find out from your health care provider how to quit. If you do not use tobacco, do not start.  Lung cancer screening is recommended for adults aged 53-80 years who are at high risk for developing lung cancer because of a history of smoking. A yearly low-dose CT scan of the lungs is recommended for people who have at least a 30-pack-year history of smoking and are a current smoker or have quit within the past 15 years. A pack year  of smoking is smoking an average of 1 pack of cigarettes a day for 1 year (for example: 1 pack a day for 30 years or 2 packs a day for 15 years). Yearly screening should continue until the smoker has stopped smoking for at least 15 years. Yearly screening should be stopped for people who develop a health problem that would prevent them from having lung cancer treatment.  If you are pregnant, do not drink alcohol. If you are breastfeeding, be very  cautious about drinking alcohol. If you are not pregnant and choose to drink alcohol, do not have more than 1 drink per day. One drink is considered to be 12 ounces (355 mL) of beer, 5 ounces (148 mL) of wine, or 1.5 ounces (44 mL) of liquor.  Avoid use of street drugs. Do not share needles with anyone. Ask for help if you need support or instructions about stopping the use of drugs.  High blood pressure causes heart disease and increases the risk of stroke. Your blood pressure should be checked at least every 1 to 2 years. Ongoing high blood pressure should be treated with medicines if weight loss and exercise do not work.  If you are 34-47 years old, ask your health care provider if you should take aspirin to prevent strokes.  Diabetes screening is done by taking a blood sample to check your blood glucose level after you have not eaten for a certain period of time (fasting). If you are not overweight and you do not have risk factors for diabetes, you should be screened once every 3 years starting at age 28. If you are overweight or obese and you are 74-53 years of age, you should be screened for diabetes every year as part of your cardiovascular risk assessment.  Breast cancer screening is essential preventive care for women. You should practice "breast self-awareness." This means understanding the normal appearance and feel of your breasts and may include breast self-examination. Any changes detected, no matter how small, should be reported to a health care provider. Women in their 77s and 30s should have a clinical breast exam (CBE) by a health care provider as part of a regular health exam every 1 to 3 years. After age 65, women should have a CBE every year. Starting at age 43, women should consider having a mammogram (breast X-ray test) every year. Women who have a family history of breast cancer should talk to their health care provider about genetic screening. Women at a high risk of breast cancer  should talk to their health care providers about having an MRI and a mammogram every year.  Breast cancer gene (BRCA)-related cancer risk assessment is recommended for women who have family members with BRCA-related cancers. BRCA-related cancers include breast, ovarian, tubal, and peritoneal cancers. Having family members with these cancers may be associated with an increased risk for harmful changes (mutations) in the breast cancer genes BRCA1 and BRCA2. Results of the assessment will determine the need for genetic counseling and BRCA1 and BRCA2 testing.  Your health care provider may recommend that you be screened regularly for cancer of the pelvic organs (ovaries, uterus, and vagina). This screening involves a pelvic examination, including checking for microscopic changes to the surface of your cervix (Pap test). You may be encouraged to have this screening done every 3 years, beginning at age 71.  For women ages 40-65, health care providers may recommend pelvic exams and Pap testing every 3 years, or they may recommend the Pap  and pelvic exam, combined with testing for human papilloma virus (HPV), every 5 years. Some types of HPV increase your risk of cervical cancer. Testing for HPV may also be done on women of any age with unclear Pap test results.  Other health care providers may not recommend any screening for nonpregnant women who are considered low risk for pelvic cancer and who do not have symptoms. Ask your health care provider if a screening pelvic exam is right for you.  If you have had past treatment for cervical cancer or a condition that could lead to cancer, you need Pap tests and screening for cancer for at least 20 years after your treatment. If Pap tests have been discontinued, your risk factors (such as having a new sexual partner) need to be reassessed to determine if screening should resume. Some women have medical problems that increase the chance of getting cervical cancer. In  these cases, your health care provider may recommend more frequent screening and Pap tests.  Colorectal cancer can be detected and often prevented. Most routine colorectal cancer screening begins at the age of 54 years and continues through age 59 years. However, your health care provider may recommend screening at an earlier age if you have risk factors for colon cancer. On a yearly basis, your health care provider may provide home test kits to check for hidden blood in the stool. Use of a small camera at the end of a tube, to directly examine the colon (sigmoidoscopy or colonoscopy), can detect the earliest forms of colorectal cancer. Talk to your health care provider about this at age 32, when routine screening begins. Direct exam of the colon should be repeated every 5-10 years through age 56 years, unless early forms of precancerous polyps or small growths are found.  People who are at an increased risk for hepatitis B should be screened for this virus. You are considered at high risk for hepatitis B if:  You were born in a country where hepatitis B occurs often. Talk with your health care provider about which countries are considered high risk.  Your parents were born in a high-risk country and you have not received a shot to protect against hepatitis B (hepatitis B vaccine).  You have HIV or AIDS.  You use needles to inject street drugs.  You live with, or have sex with, someone who has hepatitis B.  You get hemodialysis treatment.  You take certain medicines for conditions like cancer, organ transplantation, and autoimmune conditions.  Hepatitis C blood testing is recommended for all people born from [birth year] through 1965 and any individual with known risks for hepatitis C.  Practice safe sex. Use condoms and avoid high-risk sexual practices to reduce the spread of sexually transmitted infections (STIs). STIs include gonorrhea, chlamydia, syphilis, trichomonas, herpes, HPV, and human  immunodeficiency virus (HIV). Herpes, HIV, and HPV are viral illnesses that have no cure. They can result in disability, cancer, and death.  You should be screened for sexually transmitted illnesses (STIs) including gonorrhea and chlamydia if:  You are sexually active and are younger than 24 years.  You are older than 24 years and your health care provider tells you that you are at risk for this type of infection.  Your sexual activity has changed since you were last screened and you are at an increased risk for chlamydia or gonorrhea. Ask your health care provider if you are at risk.  If you are at risk of being infected with HIV, it  is recommended that you take a prescription medicine daily to prevent HIV infection. This is called preexposure prophylaxis (PrEP). You are considered at risk if:  You are sexually active and do not regularly use condoms or know the HIV status of your partner(s).  You take drugs by injection.  You are sexually active with a partner who has HIV.  Talk with your health care provider about whether you are at high risk of being infected with HIV. If you choose to begin PrEP, you should first be tested for HIV. You should then be tested every 3 months for as long as you are taking PrEP.  Osteoporosis is a disease in which the bones lose minerals and strength with aging. This can result in serious bone fractures or breaks. The risk of osteoporosis can be identified using a bone density scan. Women ages 31 years and over and women at risk for fractures or osteoporosis should discuss screening with their health care providers. Ask your health care provider whether you should take a calcium supplement or vitamin D to reduce the rate of osteoporosis.  Menopause can be associated with physical symptoms and risks. Hormone replacement therapy is available to decrease symptoms and risks. You should talk to your health care provider about whether hormone replacement therapy is  right for you.  Use sunscreen. Apply sunscreen liberally and repeatedly throughout the day. You should seek shade when your shadow is shorter than you. Protect yourself by wearing long sleeves, pants, a wide-brimmed hat, and sunglasses year round, whenever you are outdoors.  Once a month, do a whole body skin exam, using a mirror to look at the skin on your back. Tell your health care provider of new moles, moles that have irregular borders, moles that are larger than a pencil eraser, or moles that have changed in shape or color.  Stay current with required vaccines (immunizations).  Influenza vaccine. All adults should be immunized every year.  Tetanus, diphtheria, and acellular pertussis (Td, Tdap) vaccine. Pregnant women should receive 1 dose of Tdap vaccine during each pregnancy. The dose should be obtained regardless of the length of time since the last dose. Immunization is preferred during the 27th-36th week of gestation. An adult who has not previously received Tdap or who does not know her vaccine status should receive 1 dose of Tdap. This initial dose should be followed by tetanus and diphtheria toxoids (Td) booster doses every 10 years. Adults with an unknown or incomplete history of completing a 3-dose immunization series with Td-containing vaccines should begin or complete a primary immunization series including a Tdap dose. Adults should receive a Td booster every 10 years.  Varicella vaccine. An adult without evidence of immunity to varicella should receive 2 doses or a second dose if she has previously received 1 dose. Pregnant females who do not have evidence of immunity should receive the first dose after pregnancy. This first dose should be obtained before leaving the health care facility. The second dose should be obtained 4-8 weeks after the first dose.  Human papillomavirus (HPV) vaccine. Females aged 13-26 years who have not received the vaccine previously should obtain the  3-dose series. The vaccine is not recommended for use in pregnant females. However, pregnancy testing is not needed before receiving a dose. If a female is found to be pregnant after receiving a dose, no treatment is needed. In that case, the remaining doses should be delayed until after the pregnancy. Immunization is recommended for any person with an  immunocompromised condition through the age of 80 years if she did not get any or all doses earlier. During the 3-dose series, the second dose should be obtained 4-8 weeks after the first dose. The third dose should be obtained 24 weeks after the first dose and 16 weeks after the second dose.  Zoster vaccine. One dose is recommended for adults aged 40 years or older unless certain conditions are present.  Measles, mumps, and rubella (MMR) vaccine. Adults born before [birth year] generally are considered immune to measles and mumps. Adults born in [birth year] or later should have 1 or more doses of MMR vaccine unless there is a contraindication to the vaccine or there is laboratory evidence of immunity to each of the three diseases. A routine second dose of MMR vaccine should be obtained at least 28 days after the first dose for students attending postsecondary schools, health care workers, or international travelers. People who received inactivated measles vaccine or an unknown type of measles vaccine during 1963-1967 should receive 2 doses of MMR vaccine. People who received inactivated mumps vaccine or an unknown type of mumps vaccine before 1979 and are at high risk for mumps infection should consider immunization with 2 doses of MMR vaccine. For females of childbearing age, rubella immunity should be determined. If there is no evidence of immunity, females who are not pregnant should be vaccinated. If there is no evidence of immunity, females who are pregnant should delay immunization until after pregnancy. Unvaccinated health care workers born before [birth year] who lack  laboratory evidence of measles, mumps, or rubella immunity or laboratory confirmation of disease should consider measles and mumps immunization with 2 doses of MMR vaccine or rubella immunization with 1 dose of MMR vaccine.  Pneumococcal 13-valent conjugate (PCV13) vaccine. When indicated, a person who is uncertain of his immunization history and has no record of immunization should receive the PCV13 vaccine. All adults 59 years of age and older should receive this vaccine. An adult aged 102 years or older who has certain medical conditions and has not been previously immunized should receive 1 dose of PCV13 vaccine. This PCV13 should be followed with a dose of pneumococcal polysaccharide (PPSV23) vaccine. Adults who are at high risk for pneumococcal disease should obtain the PPSV23 vaccine at least 8 weeks after the dose of PCV13 vaccine. Adults older than 68 years of age who have normal immune system function should obtain the PPSV23 vaccine dose at least 1 year after the dose of PCV13 vaccine.  Pneumococcal polysaccharide (PPSV23) vaccine. When PCV13 is also indicated, PCV13 should be obtained first. All adults aged 53 years and older should be immunized. An adult younger than age 73 years who has certain medical conditions should be immunized. Any person who resides in a nursing home or long-term care facility should be immunized. An adult smoker should be immunized. People with an immunocompromised condition and certain other conditions should receive both PCV13 and PPSV23 vaccines. People with human immunodeficiency virus (HIV) infection should be immunized as soon as possible after diagnosis. Immunization during chemotherapy or radiation therapy should be avoided. Routine use of PPSV23 vaccine is not recommended for American Indians, [place] Natives, or people younger than 65 years unless there are medical conditions that require PPSV23 vaccine. When indicated, people who have unknown immunization and have  no record of immunization should receive PPSV23 vaccine. One-time revaccination 5 years after the first dose of PPSV23 is recommended for people aged 19-64 years who have chronic kidney failure, nephrotic syndrome, asplenia,  or immunocompromised conditions. People who received 1-2 doses of PPSV23 before age 68 years should receive another dose of PPSV23 vaccine at age 67 years or later if at least 5 years have passed since the previous dose. Doses of PPSV23 are not needed for people immunized with PPSV23 at or after age 24 years.  Meningococcal vaccine. Adults with asplenia or persistent complement component deficiencies should receive 2 doses of quadrivalent meningococcal conjugate (MenACWY-D) vaccine. The doses should be obtained at least 2 months apart. Microbiologists working with certain meningococcal bacteria, [place] recruits, people at risk during an outbreak, and people who travel to or live in countries with a high rate of meningitis should be immunized. A first-year college student up through age 55 years who is living in a residence hall should receive a dose if she did not receive a dose on or after her 16th birthday. Adults who have certain high-risk conditions should receive one or more doses of vaccine.  Hepatitis A vaccine. Adults who wish to be protected from this disease, have certain high-risk conditions, work with hepatitis A-infected animals, work in hepatitis A research labs, or travel to or work in countries with a high rate of hepatitis A should be immunized. Adults who were previously unvaccinated and who anticipate close contact with an international adoptee during the first 60 days after arrival in the [place] from a country with a high rate of hepatitis A should be immunized.  Hepatitis B vaccine. Adults who wish to be protected from this disease, have certain high-risk conditions, may be exposed to blood or other infectious body fluids, are household contacts or sex  partners of hepatitis B positive people, are clients or workers in certain care facilities, or travel to or work in countries with a high rate of hepatitis B should be immunized.  Haemophilus influenzae type b (Hib) vaccine. A previously unvaccinated person with asplenia or sickle cell disease or having a scheduled splenectomy should receive 1 dose of Hib vaccine. Regardless of previous immunization, a recipient of a hematopoietic stem cell transplant should receive a 3-dose series 6-12 months after her successful transplant. Hib vaccine is not recommended for adults with HIV infection. Preventive Services / Frequency Ages 67 to 40 years  Blood pressure check.** / Every 3-5 years.  Lipid and cholesterol check.** / Every 5 years beginning at age 46.  Clinical breast exam.** / Every 3 years for women in their 56s and 79s.  BRCA-related cancer risk assessment.** / For women who have family members with a BRCA-related cancer (breast, ovarian, tubal, or peritoneal cancers).  Pap test.** / Every 2 years from ages 40 through 110. Every 3 years starting at age 67 through age 48 or 86 with a history of 3 consecutive normal Pap tests.  HPV screening.** / Every 3 years from ages 70 through ages 52 to 68 with a history of 3 consecutive normal Pap tests.  Hepatitis C blood test.** / For any individual with known risks for hepatitis C.  Skin self-exam. / Monthly.  Influenza vaccine. / Every year.  Tetanus, diphtheria, and acellular pertussis (Tdap, Td) vaccine.** / Consult your health care provider. Pregnant women should receive 1 dose of Tdap vaccine during each pregnancy. 1 dose of Td every 10 years.  Varicella vaccine.** / Consult your health care provider. Pregnant females who do not have evidence of immunity should receive the first dose after pregnancy.  HPV vaccine. / 3 doses over 6 months, if 65 and younger. The vaccine is not  recommended for use in pregnant females. However, pregnancy testing  is not needed before receiving a dose.  Measles, mumps, rubella (MMR) vaccine.** / You need at least 1 dose of MMR if you were born in 1957 or later. You may also need a 2nd dose. For females of childbearing age, rubella immunity should be determined. If there is no evidence of immunity, females who are not pregnant should be vaccinated. If there is no evidence of immunity, females who are pregnant should delay immunization until after pregnancy.  Pneumococcal 13-valent conjugate (PCV13) vaccine.** / Consult your health care provider.  Pneumococcal polysaccharide (PPSV23) vaccine.** / 1 to 2 doses if you smoke cigarettes or if you have certain conditions.  Meningococcal vaccine.** / 1 dose if you are age 65 to 41 years and a Market researcher living in a residence hall, or have one of several medical conditions, you need to get vaccinated against meningococcal disease. You may also need additional booster doses.  Hepatitis A vaccine.** / Consult your health care provider.  Hepatitis B vaccine.** / Consult your health care provider.  Haemophilus influenzae type b (Hib) vaccine.** / Consult your health care provider. Ages 41 to 4 years  Blood pressure check.** / Every year.  Lipid and cholesterol check.** / Every 5 years beginning at age 60 years.  Lung cancer screening. / Every year if you are aged 43-80 years and have a 30-pack-year history of smoking and currently smoke or have quit within the past 15 years. Yearly screening is stopped once you have quit smoking for at least 15 years or develop a health problem that would prevent you from having lung cancer treatment.  Clinical breast exam.** / Every year after age 77 years.  BRCA-related cancer risk assessment.** / For women who have family members with a BRCA-related cancer (breast, ovarian, tubal, or peritoneal cancers).  Mammogram.** / Every year beginning at age 11 years and continuing for as long as you are in good  health. Consult with your health care provider.  Pap test.** / Every 3 years starting at age 74 years through age 45 or 70 years with a history of 3 consecutive normal Pap tests.  HPV screening.** / Every 3 years from ages 24 years through ages 78 to 66 years with a history of 3 consecutive normal Pap tests.  Fecal occult blood test (FOBT) of stool. / Every year beginning at age 39 years and continuing until age 97 years. You may not need to do this test if you get a colonoscopy every 10 years.  Flexible sigmoidoscopy or colonoscopy.** / Every 5 years for a flexible sigmoidoscopy or every 10 years for a colonoscopy beginning at age 72 years and continuing until age 48 years.  Hepatitis C blood test.** / For all people born from [birth year] through 1963 and any individual with known risks for hepatitis C.  Skin self-exam. / Monthly.  Influenza vaccine. / Every year.  Tetanus, diphtheria, and acellular pertussis (Tdap/Td) vaccine.** / Consult your health care provider. Pregnant women should receive 1 dose of Tdap vaccine during each pregnancy. 1 dose of Td every 10 years.  Varicella vaccine.** / Consult your health care provider. Pregnant females who do not have evidence of immunity should receive the first dose after pregnancy.  Zoster vaccine.** / 1 dose for adults aged 77 years or older.  Measles, mumps, rubella (MMR) vaccine.** / You need at least 1 dose of MMR if you were born in 1957 or later. You may also need  a second dose. For females of childbearing age, rubella immunity should be determined. If there is no evidence of immunity, females who are not pregnant should be vaccinated. If there is no evidence of immunity, females who are pregnant should delay immunization until after pregnancy.  Pneumococcal 13-valent conjugate (PCV13) vaccine.** / Consult your health care provider.  Pneumococcal polysaccharide (PPSV23) vaccine.** / 1 to 2 doses if you smoke cigarettes or if you have certain  conditions.  Meningococcal vaccine.** / Consult your health care provider.  Hepatitis A vaccine.** / Consult your health care provider.  Hepatitis B vaccine.** / Consult your health care provider.  Haemophilus influenzae type b (Hib) vaccine.** / Consult your health care provider. Ages 71 years and over  Blood pressure check.** / Every year.  Lipid and cholesterol check.** / Every 5 years beginning at age 93 years.  Lung cancer screening. / Every year if you are aged 31-80 years and have a 30-pack-year history of smoking and currently smoke or have quit within the past 15 years. Yearly screening is stopped once you have quit smoking for at least 15 years or develop a health problem that would prevent you from having lung cancer treatment.  Clinical breast exam.** / Every year after age 81 years.  BRCA-related cancer risk assessment.** / For women who have family members with a BRCA-related cancer (breast, ovarian, tubal, or peritoneal cancers).  Mammogram.** / Every year beginning at age 73 years and continuing for as long as you are in good health. Consult with your health care provider.  Pap test.** / Every 3 years starting at age 48 years through age 61 or 92 years with 3 consecutive normal Pap tests. Testing can be stopped between 65 and 70 years with 3 consecutive normal Pap tests and no abnormal Pap or HPV tests in the past 10 years.  HPV screening.** / Every 3 years from ages 49 years through ages 40 or 70 years with a history of 3 consecutive normal Pap tests. Testing can be stopped between 65 and 70 years with 3 consecutive normal Pap tests and no abnormal Pap or HPV tests in the past 10 years.  Fecal occult blood test (FOBT) of stool. / Every year beginning at age 32 years and continuing until age 67 years. You may not need to do this test if you get a colonoscopy every 10 years.  Flexible sigmoidoscopy or colonoscopy.** / Every 5 years for a flexible sigmoidoscopy or every 10  years for a colonoscopy beginning at age 110 years and continuing until age 91 years.  Hepatitis C blood test.** / For all people born from [birth year] through 1963 and any individual with known risks for hepatitis C.  Osteoporosis screening.** / A one-time screening for women ages 64 years and over and women at risk for fractures or osteoporosis.  Skin self-exam. / Monthly.  Influenza vaccine. / Every year.  Tetanus, diphtheria, and acellular pertussis (Tdap/Td) vaccine.** / 1 dose of Td every 10 years.  Varicella vaccine.** / Consult your health care provider.  Zoster vaccine.** / 1 dose for adults aged 64 years or older.  Pneumococcal 13-valent conjugate (PCV13) vaccine.** / Consult your health care provider.  Pneumococcal polysaccharide (PPSV23) vaccine.** / 1 dose for all adults aged 72 years and older.  Meningococcal vaccine.** / Consult your health care provider.  Hepatitis A vaccine.** / Consult your health care provider.  Hepatitis B vaccine.** / Consult your health care provider.  Haemophilus influenzae type b (Hib) vaccine.** / Consult your  health care provider. ** Family history and personal history of risk and conditions may change your health care provider's recommendations.   This information is not intended to replace advice given to you by your health care provider. Make sure you discuss any questions you have with your health care provider.   Document Released: 03/21/2001 Document Revised: 02/13/2014 Document Reviewed: 06/20/2010 Elsevier Interactive Patient Education [employer].

## 2016-02-11 DIAGNOSIS — R69 Illness, unspecified: Secondary | ICD-10-CM | POA: Diagnosis not present

## 2016-02-20 ENCOUNTER — Other Ambulatory Visit: Payer: Self-pay | Admitting: Gastroenterology

## 2016-05-16 ENCOUNTER — Telehealth: Payer: Self-pay | Admitting: Gastroenterology

## 2016-05-16 ENCOUNTER — Other Ambulatory Visit: Payer: Self-pay | Admitting: Gastroenterology

## 2016-05-17 ENCOUNTER — Other Ambulatory Visit: Payer: Self-pay | Admitting: Internal Medicine

## 2016-05-17 MED ORDER — PANTOPRAZOLE SODIUM 40 MG PO TBEC: 40.0000 mg | DELAYED_RELEASE_TABLET | Freq: Every day | ORAL | 0 refills | Status: DC

## 2016-05-17 NOTE — Telephone Encounter (Signed)
Pt has been notified and aware. She states clear understanding.  

## 2016-05-17 NOTE — Telephone Encounter (Signed)
Refills needed for Protonix 40 mg one a day. Last seen 03-2015. Please advise.

## 2016-05-17 NOTE — Telephone Encounter (Signed)
Yes, it can be refilled for one tablet every day, #30, 2 refills.  If she wants me to continue to prescribe vs have PCP do it, then please see me in next 3 months.  Also, reminder that she is due for a routine colonoscopy this fall.

## 2016-06-28 DIAGNOSIS — Z1231 Encounter for screening mammogram for malignant neoplasm of breast: Secondary | ICD-10-CM | POA: Diagnosis not present

## 2016-06-28 LAB — HM MAMMOGRAPHY

## 2016-06-30 ENCOUNTER — Encounter: Payer: Self-pay | Admitting: Family Medicine

## 2016-07-11 DIAGNOSIS — R69 Illness, unspecified: Secondary | ICD-10-CM | POA: Diagnosis not present

## 2016-08-13 ENCOUNTER — Other Ambulatory Visit: Payer: Self-pay | Admitting: Internal Medicine

## 2016-08-14 DIAGNOSIS — L814 Other melanin hyperpigmentation: Secondary | ICD-10-CM | POA: Diagnosis not present

## 2016-08-14 DIAGNOSIS — L821 Other seborrheic keratosis: Secondary | ICD-10-CM | POA: Diagnosis not present

## 2016-08-14 DIAGNOSIS — L918 Other hypertrophic disorders of the skin: Secondary | ICD-10-CM | POA: Diagnosis not present

## 2016-08-14 DIAGNOSIS — D225 Melanocytic nevi of trunk: Secondary | ICD-10-CM | POA: Diagnosis not present

## 2016-08-15 IMAGING — CT CT KNEE*R* W/O CM
3 of 5 series · 6 of 14 positions shown, 7 images · non-contrast
Comparison: None.

CLINICAL DATA: Osteoarthritis of the right knee.

EXAM:
CT OF THE RIGHT HIP WITHOUT CONTRAST, CT SCAN OF THE RIGHT KNEE
WITHOUT CONTRAST, AND CT SCAN OF THE RIGHT ANKLE WITHOUT CONTRAST
TECHNIQUE: Multidetector CT imaging of the right hip, knee and ankle was
performed according to the [employer] protocol. Multiplanar CT image
reconstructions were also generated.

[Series 9: knee soft · axial · 0.39mm/px · z∈[-505,-397]mm · 2 of 130 slices shown]
[im 44/130  soft-tissue]
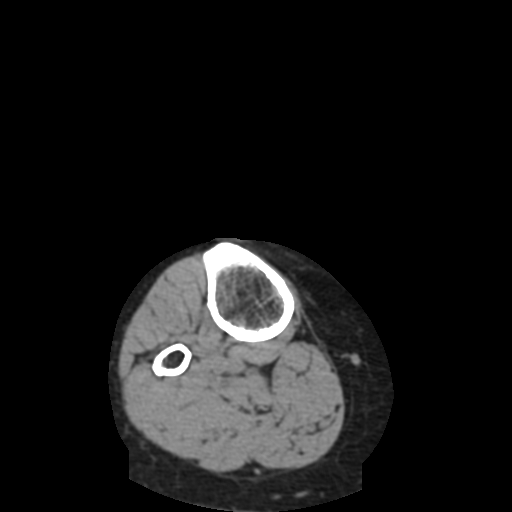
[im 87/130  soft-tissue]
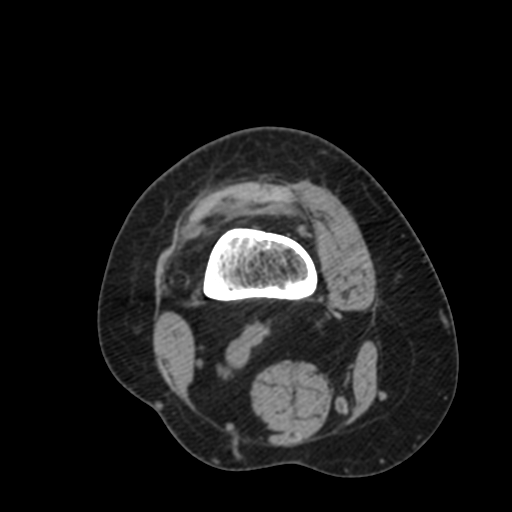

[Series 300: sag knee bone · sagittal · 0.65mm/px · 2 of 136 slices shown, 3 images]
[im 46/136  soft-tissue]
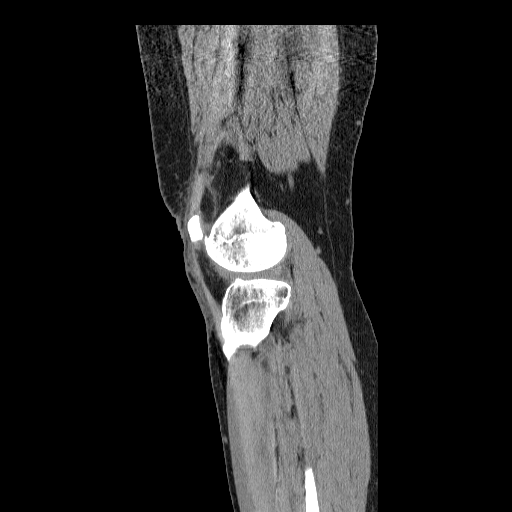
[im 46/136  bone]
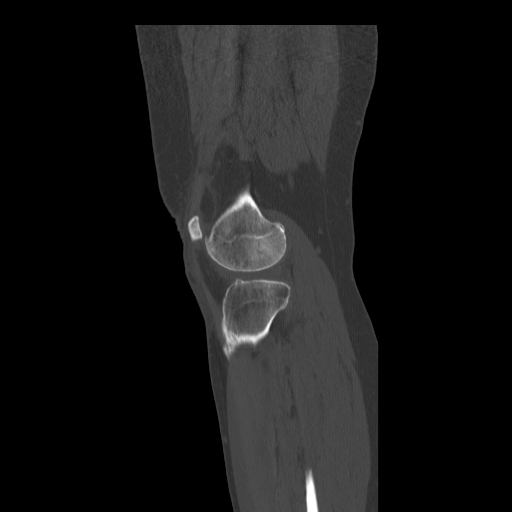
[im 91/136  bone]
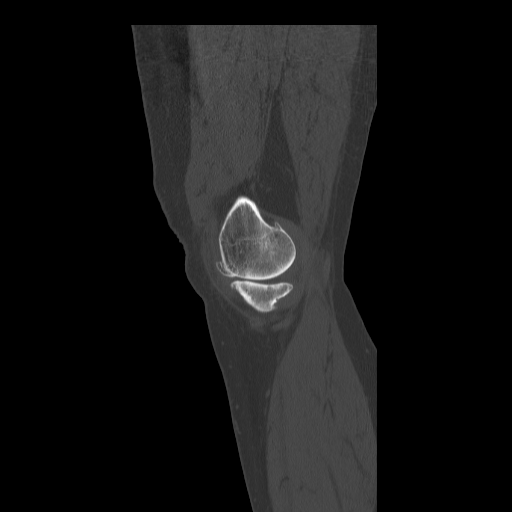

[Series 301: cor knee bone · coronal · 0.65mm/px · 2 of 134 slices shown]
[im 45/134  bone]
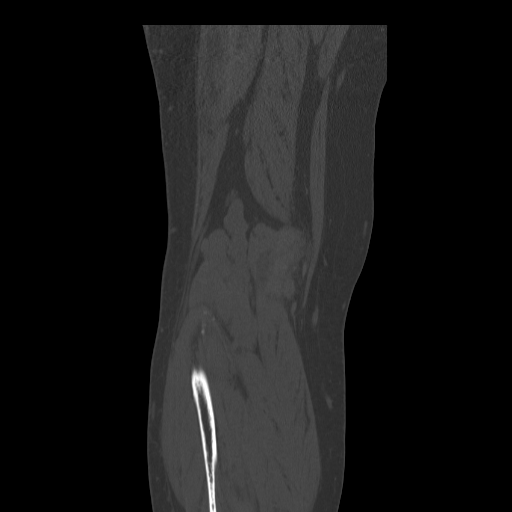
[im 89/134  bone]
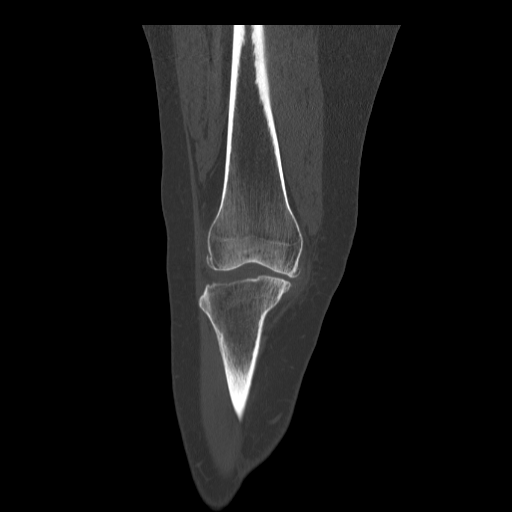

[6 of 14 positions shown; findings below may reference images not displayed]

FINDINGS: Axial images were obtained through the right hip and right ankle for
pre-surgical planning purposes.

Axial images were obtained through the right knee with multi planer
reformatted images created.

There is severe osteoarthritis of the medial compartment of the
right knee with joint space narrowing and cortical sclerosis and
marginal osteophyte formation. Small calcified loose body in the
posterior aspect of the medial compartment.

Slight osteoarthritis of the patellofemoral and lateral
compartments. No joint effusion.
IMPRESSION: Severe osteoarthritis of the medial compartment of the right knee.

## 2016-09-10 ENCOUNTER — Other Ambulatory Visit: Payer: Self-pay | Admitting: Internal Medicine

## 2016-10-17 ENCOUNTER — Telehealth: Payer: Self-pay | Admitting: Gastroenterology

## 2016-10-17 NOTE — Telephone Encounter (Signed)
Sure

## 2016-10-17 NOTE — Telephone Encounter (Signed)
As always, if that is the patient's wish, then I am OK with it.  Accepting MD would have to agree.

## 2016-10-17 NOTE — Telephone Encounter (Signed)
Dr. Henrene Pastor will you accept this patient?

## 2016-10-26 ENCOUNTER — Encounter: Payer: Self-pay | Admitting: Internal Medicine

## 2016-10-26 NOTE — Telephone Encounter (Signed)
LM w/husband to call back to schedule an appt with Dr. Henrene Pastor

## 2016-11-02 DIAGNOSIS — H2513 Age-related nuclear cataract, bilateral: Secondary | ICD-10-CM | POA: Diagnosis not present

## 2016-11-02 DIAGNOSIS — H524 Presbyopia: Secondary | ICD-10-CM | POA: Diagnosis not present

## 2016-11-06 ENCOUNTER — Encounter: Payer: Self-pay | Admitting: Gynecology

## 2016-11-06 ENCOUNTER — Ambulatory Visit (INDEPENDENT_AMBULATORY_CARE_PROVIDER_SITE_OTHER): Payer: Medicare HMO | Admitting: Gynecology

## 2016-11-06 VITALS — BP 122/74 | Ht <= 58 in | Wt 161.0 lb

## 2016-11-06 DIAGNOSIS — Z1272 Encounter for screening for malignant neoplasm of vagina: Secondary | ICD-10-CM

## 2016-11-06 DIAGNOSIS — N952 Postmenopausal atrophic vaginitis: Secondary | ICD-10-CM

## 2016-11-06 DIAGNOSIS — Z01411 Encounter for gynecological examination (general) (routine) with abnormal findings: Secondary | ICD-10-CM | POA: Diagnosis not present

## 2016-11-06 DIAGNOSIS — M8580 Other specified disorders of bone density and structure, unspecified site: Secondary | ICD-10-CM

## 2016-11-06 NOTE — Addendum Note (Signed)
Addended by: Nelva Nay on: 11/06/2016 02:55 PM   Modules accepted: Orders

## 2016-11-06 NOTE — Patient Instructions (Signed)
Followup for bone density as scheduled. 

## 2016-11-06 NOTE — Progress Notes (Signed)
    Robin Francis 07-03-1945 [id number]        71 y.o.  G2P2002 for breast and pelvic exam.  Past medical history,surgical history, problem list, medications, allergies, family history and social history were all reviewed and documented as reviewed in the EPIC chart.  ROS:  Performed with pertinent positives and negatives included in the history, assessment and plan.   Additional significant findings :  None   Exam: Caryn Bee assistant Vitals:   11/06/16 1400  BP: 122/74  Weight: 161 lb (73 kg)  Height: 4\' 10"  (1.473 m)   Body mass index is 33.65 kg/m.  General appearance:  Normal affect, orientation and appearance. Skin: Grossly normal HEENT: Without gross lesions.  No cervical or supraclavicular adenopathy. Thyroid normal.  Lungs:  Clear without wheezing, rales or rhonchi Cardiac: RR, without RMG Abdominal:  Soft, nontender, without masses, guarding, rebound, organomegaly or hernia Breasts:  Examined lying and sitting without masses, retractions, discharge or axillary adenopathy. Pelvic:  Ext, BUS, Vagina: With atrophic changes  Cervix: With atrophic changes. Pap smear done  Uterus: Anteverted, normal size, shape and contour, midline and mobile nontender   Adnexa: Without masses or tenderness    Anus and perineum: Normal   Rectovaginal: Normal sphincter tone without palpated masses or tenderness.    Assessment/Plan:  71 y.o. G48P2002 female for annual gynecologic exam.   1. Postmenopausal/atrophic genital changes. No significant hot flushes, night sweats, vaginal dryness or any vaginal bleeding. Continue to monitor report any issues or bleeding. 2. Osteopenia. DEXA 11/2014 T score -1.4 FRAX 8.5%/0.8%. Schedule DEXA now a 2 year interval. 3. Mammography 06/2016. Continue with annual mammography when due. SBE monthly reviewed. Breast exam normal today. 4. Colonoscopy due now in patients in the process of arranging. 5. Pap smear 2015. Pap smear done today. No history of  significant abnormal Pap smears. Options to stop screening per current screening guidelines based on age reviewed. Will readdress on annual basis. 6. Health maintenance. No routine lab work done as patient does this elsewhere. Follow up 1 year, sooner as needed.   Anastasio Auerbach MD, 2:17 PM 11/06/2016

## 2016-11-07 ENCOUNTER — Encounter: Payer: Self-pay | Admitting: Internal Medicine

## 2016-11-07 LAB — PAP IG W/ RFLX HPV ASCU

## 2016-11-08 ENCOUNTER — Other Ambulatory Visit: Payer: Self-pay | Admitting: Internal Medicine

## 2016-11-30 ENCOUNTER — Ambulatory Visit (INDEPENDENT_AMBULATORY_CARE_PROVIDER_SITE_OTHER): Payer: Medicare HMO

## 2016-11-30 ENCOUNTER — Other Ambulatory Visit: Payer: Self-pay | Admitting: Gynecology

## 2016-11-30 DIAGNOSIS — Z780 Asymptomatic menopausal state: Secondary | ICD-10-CM

## 2016-11-30 DIAGNOSIS — M8589 Other specified disorders of bone density and structure, multiple sites: Secondary | ICD-10-CM

## 2016-11-30 DIAGNOSIS — M8580 Other specified disorders of bone density and structure, unspecified site: Secondary | ICD-10-CM

## 2016-12-01 ENCOUNTER — Encounter: Payer: Self-pay | Admitting: Gynecology

## 2016-12-21 ENCOUNTER — Encounter: Payer: Self-pay | Admitting: Internal Medicine

## 2016-12-21 ENCOUNTER — Ambulatory Visit (INDEPENDENT_AMBULATORY_CARE_PROVIDER_SITE_OTHER): Payer: Medicare HMO | Admitting: Internal Medicine

## 2016-12-21 VITALS — BP 140/80 | HR 70 | Temp 98.2°F | Ht <= 58 in | Wt 162.0 lb

## 2016-12-21 DIAGNOSIS — I10 Essential (primary) hypertension: Secondary | ICD-10-CM

## 2016-12-21 DIAGNOSIS — Z0000 Encounter for general adult medical examination without abnormal findings: Secondary | ICD-10-CM

## 2016-12-21 DIAGNOSIS — Z23 Encounter for immunization: Secondary | ICD-10-CM

## 2016-12-21 DIAGNOSIS — M150 Primary generalized (osteo)arthritis: Secondary | ICD-10-CM

## 2016-12-21 DIAGNOSIS — K222 Esophageal obstruction: Secondary | ICD-10-CM | POA: Diagnosis not present

## 2016-12-21 DIAGNOSIS — E785 Hyperlipidemia, unspecified: Secondary | ICD-10-CM | POA: Diagnosis not present

## 2016-12-21 DIAGNOSIS — M159 Polyosteoarthritis, unspecified: Secondary | ICD-10-CM

## 2016-12-21 DIAGNOSIS — M8580 Other specified disorders of bone density and structure, unspecified site: Secondary | ICD-10-CM | POA: Diagnosis not present

## 2016-12-21 DIAGNOSIS — Z1211 Encounter for screening for malignant neoplasm of colon: Secondary | ICD-10-CM

## 2016-12-21 LAB — LIPID PANEL
CHOL/HDL RATIO: 3
CHOLESTEROL: 236 mg/dL — AB (ref 0–200)
HDL: 67.8 mg/dL (ref >39.00)
LDL Cholesterol: 148 mg/dL — ABNORMAL HIGH (ref 0–99)
NonHDL: 167.77
TRIGLYCERIDES: 99 mg/dL (ref 0.0–149.0)
VLDL: 19.8 mg/dL (ref 0.0–40.0)

## 2016-12-21 LAB — COMPREHENSIVE METABOLIC PANEL
ALBUMIN: 4.3 g/dL (ref 3.5–5.2)
ALK PHOS: 73 U/L (ref 39–117)
ALT: 26 U/L (ref 0–35)
AST: 26 U/L (ref 0–37)
BILIRUBIN TOTAL: 0.9 mg/dL (ref 0.2–1.2)
BUN: 15 mg/dL (ref 6–23)
CALCIUM: 10 mg/dL (ref 8.4–10.5)
CO2: 31 mEq/L (ref 19–32)
CREATININE: 0.8 mg/dL (ref 0.40–1.20)
Chloride: 101 mEq/L (ref 96–112)
GFR: 75.45 mL/min (ref >60.00)
Glucose, Bld: 99 mg/dL (ref 70–99)
Potassium: 4.3 mEq/L (ref 3.5–5.1)
Sodium: 140 mEq/L (ref 135–145)
TOTAL PROTEIN: 6.7 g/dL (ref 6.0–8.3)

## 2016-12-21 LAB — CBC WITH DIFFERENTIAL/PLATELET
BASOS ABS: 0.1 10*3/uL (ref 0.0–0.1)
Basophils Relative: 0.9 % (ref 0.0–3.0)
EOS ABS: 0.1 10*3/uL (ref 0.0–0.7)
Eosinophils Relative: 1.4 % (ref 0.0–5.0)
HEMATOCRIT: 40.4 % (ref 36.0–46.0)
Hemoglobin: 13.7 g/dL (ref 12.0–15.0)
LYMPHS PCT: 35 % (ref 12.0–46.0)
Lymphs Abs: 2.3 10*3/uL (ref 0.7–4.0)
MCHC: 33.8 g/dL (ref 30.0–36.0)
MCV: 91.7 fl (ref 78.0–100.0)
MONOS PCT: 8.1 % (ref 3.0–12.0)
Monocytes Absolute: 0.5 10*3/uL (ref 0.1–1.0)
NEUTROS ABS: 3.5 10*3/uL (ref 1.4–7.7)
Neutrophils Relative %: 54.6 % (ref 43.0–77.0)
PLATELETS: 293 10*3/uL (ref 150.0–400.0)
RBC: 4.4 Mil/uL (ref 3.87–5.11)
RDW: 13 % (ref 11.5–15.5)
WBC: 6.5 10*3/uL (ref 4.0–10.5)

## 2016-12-21 LAB — TSH: TSH: 1.89 u[IU]/mL (ref 0.35–4.50)

## 2016-12-21 NOTE — Progress Notes (Signed)
Subjective:    Patient ID: Robin Francis, female    DOB: 22-Jan-1946, 71 y.o.   MRN: [id number]  HPI  71 year old patient who is seen today for a wellness exam. She has a history of essential hypertension which has been well controlled. She is seen annually by gynecology and is scheduled for 10-year colonoscopy next month No concerns or complaints She is quite active and walks 2-1/2 miles most days of the week and also goes to a health club. She has had a follow-up bone density study recently and a mammogram earlier this year  Family history fairly noncontributory father died a suicide death young.  Mother also died young in her early 67s from pancreatic cancer.  One brother with hypertension  Past Medical History:  Diagnosis Date  . GERD (gastroesophageal reflux disease)   . Hypertension   . OSTEOARTHRITIS 09/17/2008  . Osteopenia 11/2016   T score -1.4 FRAX 8.8% / 1.4% stable from prior DEXA.     Social History   Socioeconomic History  . Marital status: Married    Spouse name: Not on file  . Number of children: Not on file  . Years of education: Not on file  . Highest education level: Not on file  Social Needs  . Financial resource strain: Not on file  . Food insecurity - worry: Not on file  . Food insecurity - inability: Not on file  . Transportation needs - medical: Not on file  . Transportation needs - non-medical: Not on file  Occupational History  . Not on file  Tobacco Use  . Smoking status: Never Smoker  . Smokeless tobacco: Never Used  Substance and Sexual Activity  . Alcohol use: Yes    Alcohol/week: 2.4 oz    Types: 4 Standard drinks or equivalent per week    Comment: occassionally-social  . Drug use: No  . Sexual activity: Not Currently    Birth control/protection: Post-menopausal    Comment: 1st intercourse 71 yo-Fewer than 5 partners  Other Topics Concern  . Not on file  Social History Narrative  . Not on file    Past Surgical History:    Procedure Laterality Date  . BACK SURGERY     LOWER  . KNEE ARTHROSCOPY     left  . PARTIAL KNEE ARTHROPLASTY Right 04/19/2015   Procedure: UNICOMPARTMENTAL KNEE MEDIAL REPLACMENT;  Surgeon: Gaynelle Arabian, MD;  Location: [facility];  Service: Orthopedics;  Laterality: Right;  . TONSILLECTOMY     as child  . TOTAL KNEE ARTHROPLASTY  04/03/2011   Procedure: TOTAL KNEE ARTHROPLASTY;  Surgeon: Gearlean Alf, MD;  Location: [facility];  Service: Orthopedics;  Laterality: Left;  . TRIGGER FINGER RELEASE Left     Family History  Problem Relation Age of Onset  . Pancreatic cancer Mother   . Breast cancer Maternal Grandmother        Age 2    Allergies  Allergen Reactions  . Lisinopril Other (See Comments)    Cough    Current Outpatient Medications on File Prior to Visit  Medication Sig Dispense Refill  . acetaminophen (TYLENOL) 650 MG CR tablet Take 1,300 mg by mouth every morning.    . cetirizine (ZYRTEC) 10 MG tablet Take 10 mg by mouth daily.    Marland Kitchen losartan-hydrochlorothiazide (HYZAAR) 100-25 MG tablet TAKE 1 TABLET EVERY DAY 90 tablet 0  . pantoprazole (PROTONIX) 40 MG tablet Take 1 tablet (40 mg total) by mouth daily. 90 tablet 0   No  current facility-administered medications on file prior to visit.     BP 140/80 (BP Location: Left Arm, Patient Position: Sitting, Cuff Size: Normal)   Pulse 70   Temp 98.2 F (36.8 C) (Oral)   Ht 4\' 10"  (1.473 m)   Wt 162 lb (73.5 kg)   BMI 33.86 kg/m     Review of Systems  Constitutional: Negative.   HENT: Negative for congestion, dental problem, hearing loss, rhinorrhea, sinus pressure, sore throat and tinnitus.   Eyes: Negative for pain, discharge and visual disturbance.  Respiratory: Negative for cough and shortness of breath.   Cardiovascular: Negative for chest pain, palpitations and leg swelling.  Gastrointestinal: Negative for abdominal distention, abdominal pain, blood in stool, constipation, diarrhea, nausea and vomiting.   Genitourinary: Negative for difficulty urinating, dysuria, flank pain, frequency, hematuria, pelvic pain, urgency, vaginal bleeding, vaginal discharge and vaginal pain.  Musculoskeletal: Negative for arthralgias, gait problem and joint swelling.  Skin: Negative for rash.  Neurological: Negative for dizziness, syncope, speech difficulty, weakness, numbness and headaches.  Hematological: Negative for adenopathy.  Psychiatric/Behavioral: Negative for agitation, behavioral problems and dysphoric mood. The patient is not nervous/anxious.        Objective:   Physical Exam  Constitutional: She is oriented to person, place, and time. She appears well-developed and well-nourished.  Weight 162 Blood pressure well controlled  HENT:  Head: Normocephalic and atraumatic.  Right Ear: External ear normal.  Left Ear: External ear normal.  Mouth/Throat: Oropharynx is clear and moist.  Eyes: Conjunctivae and EOM are normal.  Neck: Normal range of motion. Neck supple. No JVD present. No thyromegaly present.  Cardiovascular: Normal rate, regular rhythm, normal heart sounds and intact distal pulses.  No murmur heard. Pulmonary/Chest: Effort normal and breath sounds normal. She has no wheezes. She has no rales.  Abdominal: Soft. Bowel sounds are normal. She exhibits no distension and no mass. There is no tenderness. There is no rebound and no guarding.  Musculoskeletal: Normal range of motion. She exhibits no edema or tenderness.  Status post bilateral total knee replacement  Neurological: She is alert and oriented to person, place, and time. She has normal reflexes. No cranial nerve deficit. She exhibits normal muscle tone. Coordination normal.  Skin: Skin is warm and dry. No rash noted.  Psychiatric: She has a normal mood and affect. Her behavior is normal.          Assessment & Plan:   Preventive healthcare Essential hypertension well-controlled Overweight.  Efforts at weight loss  continued History of GERD with esophageal stricture.  Continue PPI therapy  Screening colonoscopy as scheduled  Osteopenia.  Continue calcium and vitamin D exercise.  Consider follow-up in 2-3 years  Will review screening lab  [employer]

## 2016-12-21 NOTE — Patient Instructions (Addendum)
Limit your sodium (Salt) intake  Please check your blood pressure on a regular basis.  If it is consistently greater than 150/90, please make an office appointment.    It is important that you exercise regularly, at least 20 minutes 3 to 4 times per week.  If you develop chest pain or shortness of breath seek  medical attention.  Take a calcium supplement, plus [phone] units of vitamin D   DASH Eating Plan DASH stands for "Dietary Approaches to Stop Hypertension." The DASH eating plan is a healthy eating plan that has been shown to reduce high blood pressure (hypertension). It may also reduce your risk for type 2 diabetes, heart disease, and stroke. The DASH eating plan may also help with weight loss. What are tips for following this plan? General guidelines  Avoid eating more than 2,300 mg (milligrams) of salt (sodium) a day. If you have hypertension, you may need to reduce your sodium intake to 1,500 mg a day.  Limit alcohol intake to no more than 1 drink a day for nonpregnant women and 2 drinks a day for men. One drink equals 12 oz of beer, 5 oz of wine, or 1 oz of hard liquor.  Work with your health care provider to maintain a healthy body weight or to lose weight. Ask what an ideal weight is for you.  Get at least 30 minutes of exercise that causes your heart to beat faster (aerobic exercise) most days of the week. Activities may include walking, swimming, or biking.  Work with your health care provider or diet and nutrition specialist (dietitian) to adjust your eating plan to your individual calorie needs. Reading food labels  Check food labels for the amount of sodium per serving. Choose foods with less than 5 percent of the Daily Value of sodium. Generally, foods with less than 300 mg of sodium per serving fit into this eating plan.  To find whole grains, look for the word "whole" as the first word in the ingredient list. Shopping  Buy products labeled as "low-sodium" or "no  salt added."  Buy fresh foods. Avoid canned foods and premade or frozen meals. Cooking  Avoid adding salt when cooking. Use salt-free seasonings or herbs instead of table salt or sea salt. Check with your health care provider or pharmacist before using salt substitutes.  Do not fry foods. Cook foods using healthy methods such as baking, boiling, grilling, and broiling instead.  Cook with heart-healthy oils, such as olive, canola, soybean, or sunflower oil. Meal planning   Eat a balanced diet that includes: ? 5 or more servings of fruits and vegetables each day. At each meal, try to fill half of your plate with fruits and vegetables. ? Up to 6-8 servings of whole grains each day. ? Less than 6 oz of lean meat, poultry, or fish each day. A 3-oz serving of meat is about the same size as a deck of cards. One egg equals 1 oz. ? 2 servings of low-fat dairy each day. ? A serving of nuts, seeds, or beans 5 times each week. ? Heart-healthy fats. Healthy fats called Omega-3 fatty acids are found in foods such as flaxseeds and coldwater fish, like sardines, salmon, and mackerel.  Limit how much you eat of the following: ? Canned or prepackaged foods. ? Food that is high in trans fat, such as fried foods. ? Food that is high in saturated fat, such as fatty meat. ? Sweets, desserts, sugary drinks, and other foods with  added sugar. ? Full-fat dairy products.  Do not salt foods before eating.  Try to eat at least 2 vegetarian meals each week.  Eat more home-cooked food and less restaurant, buffet, and fast food.  When eating at a restaurant, ask that your food be prepared with less salt or no salt, if possible. What foods are recommended? The items listed may not be a complete list. Talk with your dietitian about what dietary choices are best for you. Grains Whole-grain or whole-wheat bread. Whole-grain or whole-wheat pasta. Brown rice. [place]row. Bulgur. Whole-grain and low-sodium  cereals. Pita bread. Low-fat, low-sodium crackers. Whole-wheat flour tortillas. Vegetables Fresh or frozen vegetables (raw, steamed, roasted, or grilled). Low-sodium or reduced-sodium tomato and vegetable juice. Low-sodium or reduced-sodium tomato sauce and tomato paste. Low-sodium or reduced-sodium canned vegetables. Fruits All fresh, dried, or frozen fruit. Canned fruit in natural juice (without added sugar). Meat and other protein foods Skinless chicken or [place]. Ground chicken or [place]. Pork with fat trimmed off. Fish and seafood. Egg whites. Dried beans, peas, or lentils. Unsalted nuts, nut butters, and seeds. Unsalted canned beans. Lean cuts of beef with fat trimmed off. Low-sodium, lean deli meat. Dairy Low-fat (1%) or fat-free (skim) milk. Fat-free, low-fat, or reduced-fat cheeses. Nonfat, low-sodium ricotta or cottage cheese. Low-fat or nonfat yogurt. Low-fat, low-sodium cheese. Fats and oils Soft margarine without trans fats. Vegetable oil. Low-fat, reduced-fat, or light mayonnaise and salad dressings (reduced-sodium). Canola, safflower, olive, soybean, and sunflower oils. Avocado. Seasoning and other foods Herbs. Spices. Seasoning mixes without salt. Unsalted popcorn and pretzels. Fat-free sweets. What foods are not recommended? The items listed may not be a complete list. Talk with your dietitian about what dietary choices are best for you. Grains Baked goods made with fat, such as croissants, muffins, or some breads. Dry pasta or rice meal packs. Vegetables Creamed or fried vegetables. Vegetables in a cheese sauce. Regular canned vegetables (not low-sodium or reduced-sodium). Regular canned tomato sauce and paste (not low-sodium or reduced-sodium). Regular tomato and vegetable juice (not low-sodium or reduced-sodium). Angie Fava. Olives. Fruits Canned fruit in a light or heavy syrup. Fried fruit. Fruit in cream or butter sauce. Meat and other protein foods Fatty cuts of meat. Ribs.  Fried meat. Berniece Salines. Sausage. Bologna and other processed lunch meats. Salami. Fatback. Hotdogs. Bratwurst. Salted nuts and seeds. Canned beans with added salt. Canned or smoked fish. Whole eggs or egg yolks. Chicken or [place] with skin. Dairy Whole or 2% milk, cream, and half-and-half. Whole or full-fat cream cheese. Whole-fat or sweetened yogurt. Full-fat cheese. Nondairy creamers. Whipped toppings. Processed cheese and cheese spreads. Fats and oils Butter. Stick margarine. Lard. Shortening. Ghee. Bacon fat. Tropical oils, such as coconut, palm kernel, or palm oil. Seasoning and other foods Salted popcorn and pretzels. Onion salt, garlic salt, seasoned salt, table salt, and sea salt. Worcestershire sauce. Tartar sauce. Barbecue sauce. Teriyaki sauce. Soy sauce, including reduced-sodium. Steak sauce. Canned and packaged gravies. Fish sauce. Oyster sauce. Cocktail sauce. Horseradish that you find on the shelf. Ketchup. Mustard. Meat flavorings and tenderizers. Bouillon cubes. Hot sauce and Tabasco sauce. Premade or packaged marinades. Premade or packaged taco seasonings. Relishes. Regular salad dressings. Where to find more information:  National Heart, Lung, and [place]: https://wilson-eaton.com/  American Heart Association: www.heart.org Summary  The DASH eating plan is a healthy eating plan that has been shown to reduce high blood pressure (hypertension). It may also reduce your risk for type 2 diabetes, heart disease, and stroke.  With the DASH  eating plan, you should limit salt (sodium) intake to 2,300 mg a day. If you have hypertension, you may need to reduce your sodium intake to 1,500 mg a day.  When on the DASH eating plan, aim to eat more fresh fruits and vegetables, whole grains, lean proteins, low-fat dairy, and heart-healthy fats.  Work with your health care provider or diet and nutrition specialist (dietitian) to adjust your eating plan to your individual calorie needs.

## 2016-12-22 ENCOUNTER — Encounter: Payer: Medicare Other | Admitting: Internal Medicine

## 2016-12-22 LAB — HEPATITIS C ANTIBODY
HEP C AB: NONREACTIVE
SIGNAL TO CUT-OFF: 0.01 (ref <1.00)

## 2016-12-26 ENCOUNTER — Ambulatory Visit (AMBULATORY_SURGERY_CENTER): Payer: Self-pay

## 2016-12-26 VITALS — Ht <= 58 in | Wt 163.0 lb

## 2016-12-26 DIAGNOSIS — Z1211 Encounter for screening for malignant neoplasm of colon: Secondary | ICD-10-CM

## 2016-12-26 MED ORDER — SUPREP BOWEL PREP KIT 17.5-3.13-1.6 GM/177ML PO SOLN: 1.0000 | Freq: Once | ORAL | 0 refills | Status: AC

## 2016-12-26 NOTE — Progress Notes (Signed)
No allergies to eggs or soy No diet meds No home oxygen No past problems with anesthesia  Declined emmi 

## 2016-12-27 ENCOUNTER — Encounter: Payer: Self-pay | Admitting: Internal Medicine

## 2017-01-08 ENCOUNTER — Other Ambulatory Visit: Payer: Self-pay | Admitting: Internal Medicine

## 2017-01-09 ENCOUNTER — Encounter: Payer: Self-pay | Admitting: Internal Medicine

## 2017-01-09 ENCOUNTER — Ambulatory Visit (AMBULATORY_SURGERY_CENTER): Payer: Medicare HMO | Admitting: Internal Medicine

## 2017-01-09 VITALS — BP 162/90 | HR 65 | Temp 96.8°F | Resp 16 | Ht <= 58 in | Wt 162.0 lb

## 2017-01-09 DIAGNOSIS — I10 Essential (primary) hypertension: Secondary | ICD-10-CM | POA: Diagnosis not present

## 2017-01-09 DIAGNOSIS — Z1211 Encounter for screening for malignant neoplasm of colon: Secondary | ICD-10-CM

## 2017-01-09 DIAGNOSIS — D123 Benign neoplasm of transverse colon: Secondary | ICD-10-CM | POA: Diagnosis not present

## 2017-01-09 DIAGNOSIS — K635 Polyp of colon: Secondary | ICD-10-CM | POA: Diagnosis not present

## 2017-01-09 MED ORDER — SODIUM CHLORIDE 0.9 % IV SOLN: 500.0000 mL | Freq: Once | INTRAVENOUS | Status: DC

## 2017-01-09 NOTE — Op Note (Signed)
[facility] Patient Name: Robin Francis Procedure Date: 01/09/2017 11:56 AM MRN: [id number] Endoscopist: Docia Chuck. Henrene Pastor , MD Age: 71 Referring MD:  Date of Birth: 1946/01/20 Gender: Female Account #: [id number] Procedure:                Colonoscopy, with cold snare polypectomy x 1 Indications:              Screening for colorectal malignant neoplasm.                            Negative index exam 2008 Medicines:                Monitored Anesthesia Care Procedure:                Pre-Anesthesia Assessment:                           - Prior to the procedure, a History and Physical                            was performed, and patient medications and                            allergies were reviewed. The patient's tolerance of                            previous anesthesia was also reviewed. The risks                            and benefits of the procedure and the sedation                            options and risks were discussed with the patient.                            All questions were answered, and informed consent                            was obtained. Prior Anticoagulants: The patient has                            taken no previous anticoagulant or antiplatelet                            agents. After reviewing the risks and benefits, the                            patient was deemed in satisfactory condition to                            undergo the procedure.                           After obtaining informed consent, the colonoscope  was passed under direct vision. Throughout the                            procedure, the patient's blood pressure, pulse, and                            oxygen saturations were monitored continuously. The                            Colonoscope was introduced through the anus and                            advanced to the the cecum, identified by                            appendiceal orifice and ileocecal  valve. The                            ileocecal valve, appendiceal orifice, and rectum                            were photographed. The quality of the bowel                            preparation was excellent. The colonoscopy was                            performed without difficulty. The patient tolerated                            the procedure well. The bowel preparation used was                            SUPREP. Scope In: 12:08:30 PM Scope Out: 12:18:47 PM Scope Withdrawal Time: 0 hours 8 minutes 42 seconds  Total Procedure Duration: 0 hours 10 minutes 17 seconds  Findings:                 A 5 mm polyp was found in the transverse colon. The                            polyp was sessile. The polyp was removed with a                            cold snare. Resection and retrieval were complete.                           Multiple diverticula were found in the sigmoid                            colon.                           The exam was otherwise without abnormality on  direct and retroflexion views. Complications:            No immediate complications. Estimated blood loss:                            None. Estimated Blood Loss:     Estimated blood loss: none. Impression:               - One 5 mm polyp in the transverse colon, removed                            with a cold snare. Resected and retrieved.                           - Diverticulosis in the sigmoid colon.                           - The examination was otherwise normal on direct                            and retroflexion views. Recommendation:           - Repeat colonoscopy in 5 years for surveillance,                            if polyp adenomatous or SSP. Otherwise no routine                            follow-up recommended.                           - Patient has a contact number available for                            emergencies. The signs and symptoms of potential                             delayed complications were discussed with the                            patient. Return to normal activities tomorrow.                            Written discharge instructions were provided to the                            patient.                           - Resume previous diet.                           - Continue present medications.                           - Await pathology results. Docia Chuck. Henrene Pastor, MD 01/09/2017 12:23:36 PM This report has been signed electronically.

## 2017-01-09 NOTE — Progress Notes (Signed)
Called to room to assist during endoscopic procedure.  Patient ID and intended procedure confirmed with present staff. Received instructions for my participation in the procedure from the performing physician.  

## 2017-01-09 NOTE — Patient Instructions (Signed)
YOU HAD AN ENDOSCOPIC PROCEDURE TODAY AT THE [facility]:   Refer to the procedure report that was given to you for any specific questions about what was found during the examination.  If the procedure report does not answer your questions, please call your gastroenterologist to clarify.  If you requested that your care partner not be given the details of your procedure findings, then the procedure report has been included in a sealed envelope for you to review at your convenience later.  YOU SHOULD EXPECT: Some feelings of bloating in the abdomen. Passage of more gas than usual.  Walking can help get rid of the air that was put into your GI tract during the procedure and reduce the bloating. If you had a lower endoscopy (such as a colonoscopy or flexible sigmoidoscopy) you may notice spotting of blood in your stool or on the toilet paper. If you underwent a bowel prep for your procedure, you may not have a normal bowel movement for a few days.  Please Note:  You might notice some irritation and congestion in your nose or some drainage.  This is from the oxygen used during your procedure.  There is no need for concern and it should clear up in a day or so.  SYMPTOMS TO REPORT IMMEDIATELY:   Following lower endoscopy (colonoscopy or flexible sigmoidoscopy):  Excessive amounts of blood in the stool  Significant tenderness or worsening of abdominal pains  Swelling of the abdomen that is new, acute  Fever of 100F or higher   Following upper endoscopy (EGD)  Vomiting of blood or coffee ground material  New chest pain or pain under the shoulder blades  Painful or persistently difficult swallowing  New shortness of breath  Fever of 100F or higher  Black, tarry-looking stools  For urgent or emergent issues, a gastroenterologist can be reached at any hour by calling [phone].   DIET:  We do recommend a small meal at first, but then you may proceed to your regular diet.  Drink  plenty of fluids but you should avoid alcoholic beverages for 24 hours.  ACTIVITY:  You should plan to take it easy for the rest of today and you should NOT DRIVE or use heavy machinery until tomorrow (because of the sedation medicines used during the test).    FOLLOW UP: Our staff will call the number listed on your records the next business day following your procedure to check on you and address any questions or concerns that you may have regarding the information given to you following your procedure. If we do not reach you, we will leave a message.  However, if you are feeling well and you are not experiencing any problems, there is no need to return our call.  We will assume that you have returned to your regular daily activities without incident.  If any biopsies were taken you will be contacted by phone or by letter within the next 1-3 weeks.  Please call us at [phone] if you have not heard about the biopsies in 3 weeks.    SIGNATURES/CONFIDENTIALITY: You and/or your care partner have signed paperwork which will be entered into your electronic medical record.  These signatures attest to the fact that that the information above on your After Visit Summary has been reviewed and is understood.  Full responsibility of the confidentiality of this discharge information lies with you and/or your care-partner.  Polyp and diverticulosis information given. 

## 2017-01-09 NOTE — Progress Notes (Signed)
A/ox3 pleased with MAC, report to Sarah RN 

## 2017-01-10 ENCOUNTER — Telehealth: Payer: Self-pay | Admitting: *Deleted

## 2017-01-10 NOTE — Telephone Encounter (Signed)
  Follow up Call-  Call back number 01/09/2017  Post procedure Call Back phone  # [phone]  Permission to leave phone message Yes  Some recent data might be hidden     Patient questions: Spoke with husband Do you have a fever, pain , or abdominal swelling? No. Pain Score  0 *  Have you tolerated food without any problems? Yes.    Have you been able to return to your normal activities? Yes.    Do you have any questions about your discharge instructions: Diet   No. Medications  No. Follow up visit  No.  Do you have questions or concerns about your Care? No.  Actions: * If pain score is 4 or above: No action needed, pain <4.

## 2017-01-12 ENCOUNTER — Encounter: Payer: Self-pay | Admitting: Internal Medicine

## 2017-02-04 ENCOUNTER — Other Ambulatory Visit: Payer: Self-pay | Admitting: Internal Medicine

## 2017-03-15 DIAGNOSIS — R69 Illness, unspecified: Secondary | ICD-10-CM | POA: Diagnosis not present

## 2017-04-06 ENCOUNTER — Other Ambulatory Visit: Payer: Self-pay

## 2017-04-06 MED ORDER — PANTOPRAZOLE SODIUM 40 MG PO TBEC: 40.0000 mg | DELAYED_RELEASE_TABLET | Freq: Every day | ORAL | 0 refills | Status: DC

## 2017-04-10 DIAGNOSIS — J00 Acute nasopharyngitis [common cold]: Secondary | ICD-10-CM | POA: Diagnosis not present

## 2017-04-10 DIAGNOSIS — H10023 Other mucopurulent conjunctivitis, bilateral: Secondary | ICD-10-CM | POA: Diagnosis not present

## 2017-04-10 DIAGNOSIS — R05 Cough: Secondary | ICD-10-CM | POA: Diagnosis not present

## 2017-04-17 ENCOUNTER — Ambulatory Visit (INDEPENDENT_AMBULATORY_CARE_PROVIDER_SITE_OTHER): Payer: Medicare HMO | Admitting: Family Medicine

## 2017-04-17 ENCOUNTER — Encounter: Payer: Self-pay | Admitting: Family Medicine

## 2017-04-17 VITALS — BP 138/78 | HR 96 | Temp 98.0°F | Ht <= 58 in

## 2017-04-17 DIAGNOSIS — R059 Cough, unspecified: Secondary | ICD-10-CM

## 2017-04-17 DIAGNOSIS — J069 Acute upper respiratory infection, unspecified: Secondary | ICD-10-CM | POA: Diagnosis not present

## 2017-04-17 DIAGNOSIS — R05 Cough: Secondary | ICD-10-CM

## 2017-04-17 MED ORDER — HYDROCODONE-HOMATROPINE 5-1.5 MG/5ML PO SYRP: 5.0000 mL | ORAL_SOLUTION | Freq: Every evening | ORAL | 0 refills | Status: DC | PRN

## 2017-04-17 NOTE — Progress Notes (Signed)
HPI:  Acute visit for respiratory illness: -started: about 2 weeks ago and doing much better -symptoms:nasal congestion, pnd, cough - much better except cough at night and wants rx cough syrup for tickle in throat -denies:fever, SOB, NVD, tooth pain, wheezing, sinus pain -has tried: otc options and tessalon -sick contacts/travel/risks: no reported flu, strep or tick exposure  ROS: See pertinent positives and negatives per HPI.  Past Medical History:  Diagnosis Date  . GERD (gastroesophageal reflux disease)   . Hypertension   . OSTEOARTHRITIS 09/17/2008  . Osteopenia 11/2016   T score -1.4 FRAX 8.8% / 1.4% stable from prior DEXA.    Past Surgical History:  Procedure Laterality Date  . BACK SURGERY     LOWER  . KNEE ARTHROSCOPY     left  . PARTIAL KNEE ARTHROPLASTY Right 04/19/2015   Procedure: UNICOMPARTMENTAL KNEE MEDIAL REPLACMENT;  Surgeon: Gaynelle Arabian, MD;  Location: [facility];  Service: Orthopedics;  Laterality: Right;  . TONSILLECTOMY     as child  . TOTAL KNEE ARTHROPLASTY  04/03/2011   Procedure: TOTAL KNEE ARTHROPLASTY;  Surgeon: Gearlean Alf, MD;  Location: [facility];  Service: Orthopedics;  Laterality: Left;  . TRIGGER FINGER RELEASE Left     Family History  Problem Relation Age of Onset  . Pancreatic cancer Mother   . Breast cancer Maternal Grandmother        Age 92    Social History   Socioeconomic History  . Marital status: Married    Spouse name: None  . Number of children: None  . Years of education: None  . Highest education level: None  Social Needs  . Financial resource strain: None  . Food insecurity - worry: None  . Food insecurity - inability: None  . Transportation needs - medical: None  . Transportation needs - non-medical: None  Occupational History  . None  Tobacco Use  . Smoking status: Never Smoker  . Smokeless tobacco: Never Used  Substance and Sexual Activity  . Alcohol use: Yes    Alcohol/week: 2.4 oz    Types: 4 Standard  drinks or equivalent per week    Comment: occassionally-social  . Drug use: No  . Sexual activity: Not Currently    Birth control/protection: Post-menopausal    Comment: 1st intercourse 72 yo-Fewer than 5 partners  Other Topics Concern  . None  Social History Narrative  . None     Current Outpatient Medications:  .  acetaminophen (TYLENOL) 650 MG CR tablet, Take 1,300 mg by mouth every morning., Disp: , Rfl:  .  Ascorbic Acid (VITAMIN C) 1000 MG tablet, Take 1,000 mg by mouth daily., Disp: , Rfl:  .  calcium carbonate (CALCIUM 600) 600 MG TABS tablet, Take 600 mg by mouth 2 (two) times daily with a meal., Disp: , Rfl:  .  cetirizine (ZYRTEC) 10 MG tablet, Take 10 mg by mouth daily., Disp: , Rfl:  .  Cholecalciferol (VITAMIN D) 2000 units tablet, Take 2,000 Units by mouth daily., Disp: , Rfl:  .  CINNAMON PO, Take 1,000 mg by mouth daily., Disp: , Rfl:  .  Coenzyme Q10 (CO Q 10 PO), Take 120 mg by mouth., Disp: , Rfl:  .  diclofenac sodium (VOLTAREN) 1 % GEL, Apply topically 4 (four) times daily., Disp: , Rfl:  .  losartan-hydrochlorothiazide (HYZAAR) 100-25 MG tablet, TAKE 1 TABLET EVERY DAY, Disp: 90 tablet, Rfl: 0 .  Multiple Vitamin (MULTIVITAMIN) tablet, Take 1 tablet by mouth daily., Disp: ,  Rfl:  .  Omega 3-6-9 Fatty Acids (OMEGA 3-6-9 COMPLEX PO), Take by mouth., Disp: , Rfl:  .  OVER THE COUNTER MEDICATION, , Disp: , Rfl:  .  pantoprazole (PROTONIX) 40 MG tablet, Take 1 tablet (40 mg total) by mouth daily., Disp: 90 tablet, Rfl: 0 .  Red Yeast Rice Extract (RED YEAST RICE PO), Take 240 mg by mouth 2 (two) times daily., Disp: , Rfl:  .  thiamine (VITAMIN B-1) 100 MG tablet, Take 100 mg by mouth daily., Disp: , Rfl:  .  vitamin E 400 UNIT capsule, Take 400 Units by mouth daily., Disp: , Rfl:  .  HYDROcodone-homatropine (HYCODAN) 5-1.5 MG/5ML syrup, Take 5 mLs by mouth at bedtime as needed for cough., Disp: 40 mL, Rfl: 0  EXAM:  Vitals:   04/17/17 1607  BP: 138/78  Pulse: 96   Temp: 98 F (36.7 C)  SpO2: 97%    Body mass index is 33.86 kg/m.  GENERAL: vitals reviewed and listed above, alert, oriented, appears well hydrated and in no acute distress  HEENT: atraumatic, conjunttiva clear, no obvious abnormalities on inspection of external nose and ears, normal appearance of ear canals and TMs, clear nasal congestion, mild post oropharyngeal erythema with PND, no tonsillar edema or exudate, no sinus TTP  NECK: no obvious masses on inspection  LUNGS: clear to auscultation bilaterally, no wheezes, rales or rhonchi, good air movement  CV: HRRR, no peripheral edema  MS: moves all extremities without noticeable abnormality  PSYCH: pleasant and cooperative, no obvious depression or anxiety  ASSESSMENT AND PLAN:  Discussed the following assessment and plan:  Viral upper respiratory illness  Cough  -given HPI and exam findings today, a serious infection or illness is unlikely. We discussed potential etiologies, with PND from resolving VURI being most likely.. We discussed treatment side effects, likely course, antibiotic misuse, transmission, and signs of developing a serious illness. She is adamant about a cough syrup with with opioid as this has really helped in the past. Discussed risks at length. -other option also discussed - see pt instruction -of course, we advised to return or notify a doctor immediately if symptoms worsen or persist or new concerns arise.    Patient Instructions  INSTRUCTIONS FOR UPPER RESPIRATORY INFECTION:  -plenty of rest and fluids  -nasal saline wash 2-3 times daily (use prepackaged nasal saline or bottled/distilled water if making your own)   -can use AFRIN nasal spray for drainage and nasal congestion - but do NOT use longer then 3-4 days  -can use tylenol (in no history of liver disease) or ibuprofen (if no history of kidney disease, bowel bleeding or significant heart disease) as directed for aches and sorethroat  -in  the winter time, using a humidifier at night is helpful (please follow cleaning instructions)  -if you are taking a cough medication - use only as directed, may also try a teaspoon of honey to coat the throat and throat lozenges. If given a cough medication with codeine or hydrocodone or other narcotic please be advised that this contains a strong and  potentially addicting medication. Please follow instructions carefully, take as little as possible and only use AS NEEDED for severe cough. Discuss potential side effects with your pharmacy. Please do not drive or operate machinery while taking these types of medications. Please do not take other sedating medications, drugs or alcohol while taking this medication without discussing with your doctor.  -for sore throat, salt water gargles can help  -follow up  if you have fevers, facial pain, tooth pain, difficulty breathing or are worsening or symptoms persist longer then expected  Upper Respiratory Infection, Adult An upper respiratory infection (URI) is also known as the common cold. It is often caused by a type of germ (virus). Colds are easily spread (contagious). You can pass it to others by kissing, coughing, sneezing, or drinking out of the same glass. Usually, you get better in 1 to 3  weeks.  However, the cough can last for even longer. HOME CARE   Only take medicine as told by your doctor. Follow instructions provided above.  Drink enough water and fluids to keep your pee (urine) clear or pale yellow.  Get plenty of rest.  Return to work when your temperature is < 100 for 24 hours or as told by your doctor. You may use a face mask and wash your hands to stop your cold from spreading. GET HELP RIGHT AWAY IF:   After the first few days, you feel you are getting worse.  You have questions about your medicine.  You have chills, shortness of breath, or red spit (mucus).  You have pain in the face for more then 1-2 days, especially when  you bend forward.  You have a fever, puffy (swollen) neck, pain when you swallow, or white spots in the back of your throat.  You have a bad headache, ear pain, sinus pain, or chest pain.  You have a high-pitched whistling sound when you breathe in and out (wheezing).  You cough up blood.  You have sore muscles or a stiff neck. MAKE SURE YOU:   Understand these instructions.  Will watch your condition.  Will get help right away if you are not doing well or get worse. Document Released: 07/12/2007 Document Revised: 04/17/2011 Document Reviewed: 04/30/2013 [facility] Patient Information 2015 [place], [place]. This information is not intended to replace advice given to you by your health care provider. Make sure you discuss any questions you have with your health care provider.    Lucretia Kern, DO

## 2017-04-17 NOTE — Patient Instructions (Signed)

## 2017-05-02 ENCOUNTER — Other Ambulatory Visit: Payer: Self-pay | Admitting: Internal Medicine

## 2017-06-20 DIAGNOSIS — R69 Illness, unspecified: Secondary | ICD-10-CM | POA: Diagnosis not present

## 2017-06-30 ENCOUNTER — Other Ambulatory Visit: Payer: Self-pay | Admitting: Internal Medicine

## 2017-07-03 ENCOUNTER — Other Ambulatory Visit: Payer: Self-pay | Admitting: Internal Medicine

## 2017-07-06 ENCOUNTER — Encounter: Payer: Self-pay | Admitting: Internal Medicine

## 2017-07-06 DIAGNOSIS — Z1231 Encounter for screening mammogram for malignant neoplasm of breast: Secondary | ICD-10-CM | POA: Diagnosis not present

## 2017-07-14 DIAGNOSIS — M25522 Pain in left elbow: Secondary | ICD-10-CM | POA: Diagnosis not present

## 2017-10-11 ENCOUNTER — Other Ambulatory Visit: Payer: Self-pay | Admitting: Internal Medicine

## 2017-11-05 DIAGNOSIS — H524 Presbyopia: Secondary | ICD-10-CM | POA: Diagnosis not present

## 2017-11-05 DIAGNOSIS — H2513 Age-related nuclear cataract, bilateral: Secondary | ICD-10-CM | POA: Diagnosis not present

## 2017-11-07 ENCOUNTER — Ambulatory Visit: Payer: Medicare HMO | Admitting: Gynecology

## 2017-11-07 ENCOUNTER — Encounter: Payer: Self-pay | Admitting: Gynecology

## 2017-11-07 VITALS — BP 122/70 | Ht <= 58 in | Wt 167.0 lb

## 2017-11-07 DIAGNOSIS — Z01419 Encounter for gynecological examination (general) (routine) without abnormal findings: Secondary | ICD-10-CM

## 2017-11-07 DIAGNOSIS — N952 Postmenopausal atrophic vaginitis: Secondary | ICD-10-CM

## 2017-11-07 DIAGNOSIS — M8580 Other specified disorders of bone density and structure, unspecified site: Secondary | ICD-10-CM

## 2017-11-07 NOTE — Patient Instructions (Signed)
Follow-up in 1 year, sooner if any issues 

## 2017-11-07 NOTE — Progress Notes (Signed)
    Robin Francis 05/16/1945 [id number]        72 y.o.  [id number] for breast and pelvic exam.  Without gynecologic complaints  Past medical history,surgical history, problem list, medications, allergies, family history and social history were all reviewed and documented as reviewed in the EPIC chart.  ROS:  Performed with pertinent positives and negatives included in the history, assessment and plan.   Additional significant findings : None   Exam: Caryn Bee assistant Vitals:   11/07/17 1431  BP: 122/70  Weight: 167 lb (75.8 kg)  Height: 4\' 9"  (1.448 m)   Body mass index is 36.14 kg/m.  General appearance:  Normal affect, orientation and appearance. Skin: Grossly normal HEENT: Without gross lesions.  No cervical or supraclavicular adenopathy. Thyroid normal.  Lungs:  Clear without wheezing, rales or rhonchi Cardiac: RR, without RMG Abdominal:  Soft, nontender, without masses, guarding, rebound, organomegaly or hernia Breasts:  Examined lying and sitting without masses, retractions, discharge or axillary adenopathy. Pelvic:  Ext, BUS, Vagina: With atrophic changes  Cervix: With atrophic changes  Uterus: Difficult to palpate but no gross masses or tenderness.  Adnexa: Without gross masses or tenderness    Anus and perineum: Normal   Rectovaginal: Normal sphincter tone without palpated masses or tenderness.    Assessment/Plan:  72 y.o. [id number] female for annual gynecologic exam.   1. Postmenopausal/atrophic genital changes.  Doing well with no significant menopausal symptoms or any bleeding. 2. Osteopenia.  DEXA 2018 T score -1.4 FRAX 8.8% / 1.4%.  Exercises regularly.  Plan repeat DEXA next year at 2-year interval. 3. Mammography 06/2017.  Continue with annual mammography when due.  Breast exam normal today. 4. Pap smear 2018.  No Pap smear done today.  No history of significant abnormal Pap smears.  Options to stop screening per current screening guidelines based on age  reviewed.  Will readdress on an annual basis. 5. Colonoscopy 2018.  Repeat at their recommended interval. 6. Health maintenance.  No routine lab work done as patient does this elsewhere.  Follow-up 1 year, sooner as needed.   Anastasio Auerbach MD, 2:47 PM 11/07/2017

## 2017-11-21 DIAGNOSIS — R69 Illness, unspecified: Secondary | ICD-10-CM | POA: Diagnosis not present

## 2017-12-06 ENCOUNTER — Other Ambulatory Visit: Payer: Self-pay | Admitting: Internal Medicine

## 2017-12-06 DIAGNOSIS — R69 Illness, unspecified: Secondary | ICD-10-CM | POA: Diagnosis not present

## 2017-12-24 ENCOUNTER — Ambulatory Visit (INDEPENDENT_AMBULATORY_CARE_PROVIDER_SITE_OTHER): Payer: Medicare HMO | Admitting: Family Medicine

## 2017-12-24 ENCOUNTER — Encounter: Payer: Self-pay | Admitting: Family Medicine

## 2017-12-24 VITALS — BP 140/90 | HR 77 | Temp 97.5°F | Wt 166.8 lb

## 2017-12-24 DIAGNOSIS — E785 Hyperlipidemia, unspecified: Secondary | ICD-10-CM

## 2017-12-24 DIAGNOSIS — I10 Essential (primary) hypertension: Secondary | ICD-10-CM

## 2017-12-24 DIAGNOSIS — M150 Primary generalized (osteo)arthritis: Secondary | ICD-10-CM | POA: Diagnosis not present

## 2017-12-24 DIAGNOSIS — M159 Polyosteoarthritis, unspecified: Secondary | ICD-10-CM

## 2017-12-24 LAB — COMPREHENSIVE METABOLIC PANEL
ALT: 25 U/L (ref 0–35)
AST: 23 U/L (ref 0–37)
Albumin: 4.4 g/dL (ref 3.5–5.2)
Alkaline Phosphatase: 72 U/L (ref 39–117)
BILIRUBIN TOTAL: 0.9 mg/dL (ref 0.2–1.2)
BUN: 21 mg/dL (ref 6–23)
CO2: 29 meq/L (ref 19–32)
CREATININE: 0.87 mg/dL (ref 0.40–1.20)
Calcium: 9.9 mg/dL (ref 8.4–10.5)
Chloride: 102 mEq/L (ref 96–112)
GFR: 68.29 mL/min (ref >60.00)
Glucose, Bld: 101 mg/dL — ABNORMAL HIGH (ref 70–99)
Potassium: 4.2 mEq/L (ref 3.5–5.1)
SODIUM: 141 meq/L (ref 135–145)
Total Protein: 6.6 g/dL (ref 6.0–8.3)

## 2017-12-24 LAB — LIPID PANEL
CHOL/HDL RATIO: 4
Cholesterol: 221 mg/dL — ABNORMAL HIGH (ref 0–200)
HDL: 62.4 mg/dL (ref >39.00)
LDL Cholesterol: 137 mg/dL — ABNORMAL HIGH (ref 0–99)
NONHDL: 158.78
TRIGLYCERIDES: 107 mg/dL (ref 0.0–149.0)
VLDL: 21.4 mg/dL (ref 0.0–40.0)

## 2017-12-24 LAB — TSH: TSH: 1.8 u[IU]/mL (ref 0.35–4.50)

## 2017-12-24 LAB — CBC WITH DIFFERENTIAL/PLATELET
BASOS ABS: 0.1 10*3/uL (ref 0.0–0.1)
Basophils Relative: 0.9 % (ref 0.0–3.0)
EOS ABS: 0.1 10*3/uL (ref 0.0–0.7)
Eosinophils Relative: 1.5 % (ref 0.0–5.0)
HEMATOCRIT: 39.4 % (ref 36.0–46.0)
Hemoglobin: 13.4 g/dL (ref 12.0–15.0)
Lymphocytes Relative: 28.9 % (ref 12.0–46.0)
Lymphs Abs: 2 10*3/uL (ref 0.7–4.0)
MCHC: 34.1 g/dL (ref 30.0–36.0)
MCV: 90.7 fl (ref 78.0–100.0)
Monocytes Absolute: 0.6 10*3/uL (ref 0.1–1.0)
Monocytes Relative: 8.5 % (ref 3.0–12.0)
Neutro Abs: 4.3 10*3/uL (ref 1.4–7.7)
Neutrophils Relative %: 60.2 % (ref 43.0–77.0)
Platelets: 290 10*3/uL (ref 150.0–400.0)
RBC: 4.34 Mil/uL (ref 3.87–5.11)
RDW: 12.7 % (ref 11.5–15.5)
WBC: 7.1 10*3/uL (ref 4.0–10.5)

## 2017-12-24 NOTE — Addendum Note (Signed)
Addended by: Gwynne Edinger on: 12/24/2017 10:27 AM   Modules accepted: Orders

## 2017-12-24 NOTE — Patient Instructions (Addendum)
It was nice to meet you today. I will call you with bloodwork results when I get them.   I think weight loss will be most successful if you add in some higher intensity intervals to your regular exercise.   Please check blood pressures daily and report back to me when I touch base with labs.   DASH Eating Plan DASH stands for "Dietary Approaches to Stop Hypertension." The DASH eating plan is a healthy eating plan that has been shown to reduce high blood pressure (hypertension). It may also reduce your risk for type 2 diabetes, heart disease, and stroke. The DASH eating plan may also help with weight loss. What are tips for following this plan? General guidelines  Avoid eating more than 2,300 mg (milligrams) of salt (sodium) a day. If you have hypertension, you may need to reduce your sodium intake to 1,500 mg a day.  Limit alcohol intake to no more than 1 drink a day for nonpregnant women and 2 drinks a day for men. One drink equals 12 oz of beer, 5 oz of wine, or 1 oz of hard liquor.  Work with your health care provider to maintain a healthy body weight or to lose weight. Ask what an ideal weight is for you.  Get at least 30 minutes of exercise that causes your heart to beat faster (aerobic exercise) most days of the week. Activities may include walking, swimming, or biking.  Work with your health care provider or diet and nutrition specialist (dietitian) to adjust your eating plan to your individual calorie needs. Reading food labels  Check food labels for the amount of sodium per serving. Choose foods with less than 5 percent of the Daily Value of sodium. Generally, foods with less than 300 mg of sodium per serving fit into this eating plan.  To find whole grains, look for the word "whole" as the first word in the ingredient list. Shopping  Buy products labeled as "low-sodium" or "no salt added."  Buy fresh foods. Avoid canned foods and premade or frozen meals. Cooking  Avoid  adding salt when cooking. Use salt-free seasonings or herbs instead of table salt or sea salt. Check with your health care provider or pharmacist before using salt substitutes.  Do not fry foods. Cook foods using healthy methods such as baking, boiling, grilling, and broiling instead.  Cook with heart-healthy oils, such as olive, canola, soybean, or sunflower oil. Meal planning   Eat a balanced diet that includes: ? 5 or more servings of fruits and vegetables each day. At each meal, try to fill half of your plate with fruits and vegetables. ? Up to 6-8 servings of whole grains each day. ? Less than 6 oz of lean meat, poultry, or fish each day. A 3-oz serving of meat is about the same size as a deck of cards. One egg equals 1 oz. ? 2 servings of low-fat dairy each day. ? A serving of nuts, seeds, or beans 5 times each week. ? Heart-healthy fats. Healthy fats called Omega-3 fatty acids are found in foods such as flaxseeds and coldwater fish, like sardines, salmon, and mackerel.  Limit how much you eat of the following: ? Canned or prepackaged foods. ? Food that is high in trans fat, such as fried foods. ? Food that is high in saturated fat, such as fatty meat. ? Sweets, desserts, sugary drinks, and other foods with added sugar. ? Full-fat dairy products.  Do not salt foods before eating.  Try  to eat at least 2 vegetarian meals each week.  Eat more home-cooked food and less restaurant, buffet, and fast food.  When eating at a restaurant, ask that your food be prepared with less salt or no salt, if possible. What foods are recommended? The items listed may not be a complete list. Talk with your dietitian about what dietary choices are best for you. Grains Whole-grain or whole-wheat bread. Whole-grain or whole-wheat pasta. Brown rice. [place]. Bulgur. Whole-grain and low-sodium cereals. Pita bread. Low-fat, low-sodium crackers. Whole-wheat flour tortillas. Vegetables Fresh or  frozen vegetables (raw, steamed, roasted, or grilled). Low-sodium or reduced-sodium tomato and vegetable juice. Low-sodium or reduced-sodium tomato sauce and tomato paste. Low-sodium or reduced-sodium canned vegetables. Fruits All fresh, dried, or frozen fruit. Canned fruit in natural juice (without added sugar). Meat and other protein foods Skinless chicken or [place]. Ground chicken or [place]. Pork with fat trimmed off. Fish and seafood. Egg whites. Dried beans, peas, or lentils. Unsalted nuts, nut butters, and seeds. Unsalted canned beans. Lean cuts of beef with fat trimmed off. Low-sodium, lean deli meat. Dairy Low-fat (1%) or fat-free (skim) milk. Fat-free, low-fat, or reduced-fat cheeses. Nonfat, low-sodium ricotta or cottage cheese. Low-fat or nonfat yogurt. Low-fat, low-sodium cheese. Fats and oils Soft margarine without trans fats. Vegetable oil. Low-fat, reduced-fat, or light mayonnaise and salad dressings (reduced-sodium). Canola, safflower, olive, soybean, and sunflower oils. Avocado. Seasoning and other foods Herbs. Spices. Seasoning mixes without salt. Unsalted popcorn and pretzels. Fat-free sweets. What foods are not recommended? The items listed may not be a complete list. Talk with your dietitian about what dietary choices are best for you. Grains Baked goods made with fat, such as croissants, muffins, or some breads. Dry pasta or rice meal packs. Vegetables Creamed or fried vegetables. Vegetables in a cheese sauce. Regular canned vegetables (not low-sodium or reduced-sodium). Regular canned tomato sauce and paste (not low-sodium or reduced-sodium). Regular tomato and vegetable juice (not low-sodium or reduced-sodium). Angie Fava. Olives. Fruits Canned fruit in a light or heavy syrup. Fried fruit. Fruit in cream or butter sauce. Meat and other protein foods Fatty cuts of meat. Ribs. Fried meat. Berniece Salines. Sausage. Bologna and other processed lunch meats. Salami. Fatback. Hotdogs.  Bratwurst. Salted nuts and seeds. Canned beans with added salt. Canned or smoked fish. Whole eggs or egg yolks. Chicken or [place] with skin. Dairy Whole or 2% milk, cream, and half-and-half. Whole or full-fat cream cheese. Whole-fat or sweetened yogurt. Full-fat cheese. Nondairy creamers. Whipped toppings. Processed cheese and cheese spreads. Fats and oils Butter. Stick margarine. Lard. Shortening. Ghee. Bacon fat. Tropical oils, such as coconut, palm kernel, or palm oil. Seasoning and other foods Salted popcorn and pretzels. Onion salt, garlic salt, seasoned salt, table salt, and sea salt. Worcestershire sauce. Tartar sauce. Barbecue sauce. Teriyaki sauce. Soy sauce, including reduced-sodium. Steak sauce. Canned and packaged gravies. Fish sauce. Oyster sauce. Cocktail sauce. Horseradish that you find on the shelf. Ketchup. Mustard. Meat flavorings and tenderizers. Bouillon cubes. Hot sauce and Tabasco sauce. Premade or packaged marinades. Premade or packaged taco seasonings. Relishes. Regular salad dressings. Where to find more information:  National Heart, Lung, and [place]: https://wilson-eaton.com/  American Heart Association: www.heart.org Summary  The DASH eating plan is a healthy eating plan that has been shown to reduce high blood pressure (hypertension). It may also reduce your risk for type 2 diabetes, heart disease, and stroke.  With the DASH eating plan, you should limit salt (sodium) intake to 2,300 mg a day. If you  have hypertension, you may need to reduce your sodium intake to 1,500 mg a day.  When on the DASH eating plan, aim to eat more fresh fruits and vegetables, whole grains, lean proteins, low-fat dairy, and heart-healthy fats.  Work with your health care provider or diet and nutrition specialist (dietitian) to adjust your eating plan to your individual calorie needs. This information is not intended to replace advice given to you by your health care provider. Make sure you  discuss any questions you have with your health care provider. Document Released: 01/12/2011 Document Revised: 01/17/2016 Document Reviewed: 01/17/2016 Elsevier Interactive Patient Education  [employer].

## 2017-12-24 NOTE — Progress Notes (Signed)
Robin Francis DOB: [birth year] Encounter date: 12/24/2017  This is a 72 y.o. female who presents to establish care. Chief Complaint  Patient presents with  . Transitions Of Care    no new concerns,     History of present illness:  HTN: Doesn't check numbers at home.   Osteoarthritis: Has some knee pain. Usually does very well from this standpoint. Takes tylenol arthritis in the morning.   Next colonoscopy due 01/2022  Walks 5-6 days a week. Does weights as well Mondays and Fridays.   Sleeps through night. Usually 7 hours; rested in the morning.   Sees Dr. Phineas Real for gyn needs.   GERD: controlled. Tried to come off of medication, but couldn't get off.      Past Medical History:  Diagnosis Date  . GERD (gastroesophageal reflux disease)   . Hypertension   . OSTEOARTHRITIS 09/17/2008  . Osteopenia 11/2016   T score -1.4 FRAX 8.8% / 1.4% stable from prior DEXA.   Past Surgical History:  Procedure Laterality Date  . BACK SURGERY     LOWER  . KNEE ARTHROSCOPY     left  . PARTIAL KNEE ARTHROPLASTY Right 04/19/2015   Procedure: UNICOMPARTMENTAL KNEE MEDIAL REPLACMENT;  Surgeon: Gaynelle Arabian, MD;  Location: [facility];  Service: Orthopedics;  Laterality: Right;  . TONSILLECTOMY     as child  . TOTAL KNEE ARTHROPLASTY  04/03/2011   Procedure: TOTAL KNEE ARTHROPLASTY;  Surgeon: Gearlean Alf, MD;  Location: [facility];  Service: Orthopedics;  Laterality: Left;  . TRIGGER FINGER RELEASE Left    Allergies  Allergen Reactions  . Lisinopril Other (See Comments)    Cough   Current Meds  Medication Sig  . acetaminophen (TYLENOL) 650 MG CR tablet Take 1,300 mg by mouth every morning.  . Ascorbic Acid (VITAMIN C) 1000 MG tablet Take 1,000 mg by mouth daily.  . calcium carbonate (CALCIUM 600) 600 MG TABS tablet Take 600 mg by mouth 2 (two) times daily with a meal.  . cetirizine (ZYRTEC) 10 MG tablet Take 10 mg by mouth daily.  . Cholecalciferol (VITAMIN D) 2000 units tablet Take  2,000 Units by mouth daily.  Marland Kitchen CINNAMON PO Take 1,000 mg by mouth daily.  . Coenzyme Q10 (CO Q 10 PO) Take 120 mg by mouth.  . diclofenac sodium (VOLTAREN) 1 % GEL Apply topically 4 (four) times daily.  Marland Kitchen losartan-hydrochlorothiazide (HYZAAR) 100-25 MG tablet TAKE 1 TABLET BY MOUTH EVERY DAY  . Multiple Vitamin (MULTIVITAMIN) tablet Take 1 tablet by mouth daily.  . Omega 3-6-9 Fatty Acids (OMEGA 3-6-9 COMPLEX PO) Take by mouth.  Marland Kitchen OVER THE COUNTER MEDICATION   . pantoprazole (PROTONIX) 40 MG tablet TAKE 1 TABLET BY MOUTH EVERY DAY  . Red Yeast Rice Extract (RED YEAST RICE PO) Take 240 mg by mouth 2 (two) times daily.  Marland Kitchen thiamine (VITAMIN B-1) 100 MG tablet Take 100 mg by mouth daily.  . vitamin E 400 UNIT capsule Take 400 Units by mouth daily.  . [DISCONTINUED] HYDROcodone-homatropine (HYCODAN) 5-1.5 MG/5ML syrup Take 5 mLs by mouth at bedtime as needed for cough.   Social History   Tobacco Use  . Smoking status: Never Smoker  . Smokeless tobacco: Never Used  Substance Use Topics  . Alcohol use: Yes    Alcohol/week: 4.0 standard drinks    Types: 4 Standard drinks or equivalent per week    Comment: occassionally-social   Family History  Problem Relation Age of Onset  . Pancreatic cancer  Mother   . Other Father        suicide  . High blood pressure Brother   . Breast cancer Maternal Grandmother        Age 49     Review of Systems  Constitutional: Negative for activity change, appetite change, chills, fatigue, fever and unexpected weight change.  HENT: Negative for congestion, ear pain, hearing loss, sinus pressure, sinus pain, sore throat and trouble swallowing.   Eyes: Negative for pain and visual disturbance.  Respiratory: Negative for cough, chest tightness, shortness of breath and wheezing.   Cardiovascular: Negative for chest pain, palpitations and leg swelling.  Gastrointestinal: Negative for abdominal pain, blood in stool, constipation, diarrhea, nausea and vomiting.   Genitourinary: Negative for difficulty urinating and menstrual problem.  Musculoskeletal: Negative for arthralgias and back pain.  Skin: Negative for rash.  Neurological: Negative for dizziness, weakness, numbness and headaches.  Hematological: Negative for adenopathy. Does not bruise/bleed easily.  Psychiatric/Behavioral: Negative for sleep disturbance and suicidal ideas. The patient is not nervous/anxious.     Objective:  BP 140/90 (BP Location: Left Arm, Patient Position: Sitting, Cuff Size: Normal)   Pulse 77   Temp (!) 97.5 F (36.4 C) (Oral)   Wt 166 lb 12.8 oz (75.7 kg)   SpO2 97%   BMI 36.10 kg/m   Weight: 166 lb 12.8 oz (75.7 kg)   BP Readings from Last 3 Encounters:  12/24/17 140/90  11/07/17 122/70  04/17/17 138/78   Wt Readings from Last 3 Encounters:  12/24/17 166 lb 12.8 oz (75.7 kg)  11/07/17 167 lb (75.8 kg)  01/09/17 162 lb (73.5 kg)    Physical Exam  Constitutional: She is oriented to person, place, and time. She appears well-developed and well-nourished. No distress.  HENT:  Head: Normocephalic and atraumatic.  Right Ear: External ear normal.  Left Ear: External ear normal.  Mouth/Throat: Oropharynx is clear and moist. No oropharyngeal exudate.  Eyes: Pupils are equal, round, and reactive to light. Conjunctivae are normal.  Neck: Normal range of motion. Neck supple. No thyromegaly present.  Cardiovascular: Normal rate, regular rhythm and normal heart sounds. Exam reveals no gallop and no friction rub.  No murmur heard. Pulmonary/Chest: Effort normal and breath sounds normal.  Abdominal: Soft. Bowel sounds are normal. She exhibits no distension and no mass. There is no tenderness. There is no guarding. No hernia.  Musculoskeletal: Normal range of motion. She exhibits no edema, tenderness or deformity.  Lymphadenopathy:    She has no cervical adenopathy.  Neurological: She is alert and oriented to person, place, and time. She has normal strength. She  displays normal reflexes.  Reflex Scores:      Tricep reflexes are 2+ on the right side and 2+ on the left side.      Bicep reflexes are 2+ on the right side and 2+ on the left side.      Brachioradialis reflexes are 2+ on the right side and 2+ on the left side.      Patellar reflexes are 2+ on the right side and 2+ on the left side. Skin: Skin is warm and dry. No rash noted.  Psychiatric: She has a normal mood and affect. Her speech is normal and behavior is normal. Thought content normal.    Assessment/Plan: 1. Essential hypertension, benign Elevated in office today.  Recommend checking at home a couple of times/week to track.  - CBC with Differential/Platelet; Future - Comprehensive metabolic panel; Future  2. Primary osteoarthritis involving multiple  joints Stable; continue tylenol prn. Continue with active lifestyle.   3. Hyperlipidemia, unspecified hyperlipidemia type Recheck today - Comprehensive metabolic panel; Future - Lipid panel; Future - TSH; Future    Return in about 6 months (around 06/24/2018), or (pending blood pressure report).  Micheline Rough, MD

## 2017-12-28 ENCOUNTER — Encounter: Payer: Self-pay | Admitting: Family Medicine

## 2017-12-31 ENCOUNTER — Other Ambulatory Visit: Payer: Self-pay | Admitting: Family Medicine

## 2017-12-31 MED ORDER — HYDROCHLOROTHIAZIDE 25 MG PO TABS: 25.0000 mg | ORAL_TABLET | Freq: Every day | ORAL | 0 refills | Status: DC

## 2018-01-05 ENCOUNTER — Other Ambulatory Visit: Payer: Self-pay | Admitting: Internal Medicine

## 2018-01-12 ENCOUNTER — Encounter: Payer: Self-pay | Admitting: Family Medicine

## 2018-01-14 ENCOUNTER — Encounter: Payer: Self-pay | Admitting: Family Medicine

## 2018-01-16 ENCOUNTER — Other Ambulatory Visit: Payer: Self-pay | Admitting: Family Medicine

## 2018-01-16 MED ORDER — AMLODIPINE BESYLATE 2.5 MG PO TABS: 2.5000 mg | ORAL_TABLET | Freq: Every day | ORAL | 1 refills | Status: DC

## 2018-01-16 MED ORDER — LOSARTAN POTASSIUM-HCTZ 100-25 MG PO TABS: 1.0000 | ORAL_TABLET | Freq: Every day | ORAL | 1 refills | Status: DC

## 2018-01-18 ENCOUNTER — Ambulatory Visit: Payer: Self-pay | Admitting: *Deleted

## 2018-01-18 NOTE — Telephone Encounter (Signed)
Rx was sent in 01/16/18

## 2018-01-18 NOTE — Telephone Encounter (Signed)
-----   Message from Luciana Axe sent at 01/18/2018 9:35 AM EST ----- Patient is calling to ask Dr. Ethlyn Gallery is the prescribtion supposed to be losartan-HTZ 100-25 MG tablet [[id number]] because the pharmacy gave her Losartan potassium. Please advise with the patient. [phone] or [phone]   Patient is calling to verify the Rx that PCP sent to pharmacy. Verified that Rx is Hyzaar-  losartan- HCTZ. Patient is going to contact her pharmacy.  Reason for Disposition . Caller has medication question about med not prescribed by PCP and triager unable to answer question (e.g., compatibility with other med, storage)    Patient believe she was given the wrong RX- she will contact the pharmacy.  Protocols used: MEDICATION QUESTION CALL-A-AH

## 2018-01-18 NOTE — Telephone Encounter (Signed)
Losartan_HCTZ sent 01/16/18. Spoke with patient she stated that the pharmacy is unable to get the combo med so they gave her a RX of losartan and a Rx of HCTZ.

## 2018-01-22 DIAGNOSIS — M25522 Pain in left elbow: Secondary | ICD-10-CM | POA: Diagnosis not present

## 2018-01-22 DIAGNOSIS — M7712 Lateral epicondylitis, left elbow: Secondary | ICD-10-CM | POA: Diagnosis not present

## 2018-02-25 DIAGNOSIS — R69 Illness, unspecified: Secondary | ICD-10-CM | POA: Diagnosis not present

## 2018-03-07 ENCOUNTER — Encounter: Payer: Self-pay | Admitting: Family Medicine

## 2018-03-07 NOTE — Telephone Encounter (Signed)
Dr. Ethlyn Gallery please advise. Thanks

## 2018-04-15 ENCOUNTER — Other Ambulatory Visit: Payer: Self-pay | Admitting: Family Medicine

## 2018-04-15 ENCOUNTER — Encounter: Payer: Self-pay | Admitting: Family Medicine

## 2018-04-15 MED ORDER — PANTOPRAZOLE SODIUM 40 MG PO TBEC: 40.0000 mg | DELAYED_RELEASE_TABLET | Freq: Every day | ORAL | 0 refills | Status: DC

## 2018-04-16 MED ORDER — LOSARTAN POTASSIUM 100 MG PO TABS: 100.0000 mg | ORAL_TABLET | Freq: Every day | ORAL | 3 refills | Status: DC

## 2018-04-16 MED ORDER — HYDROCHLOROTHIAZIDE 25 MG PO TABS: 25.0000 mg | ORAL_TABLET | Freq: Every day | ORAL | 3 refills | Status: DC

## 2018-04-16 NOTE — Telephone Encounter (Signed)
Fine with me to send in the losartan and hctz separate. This is probably easiest option and it keeps her on the same meds.   Please send those in for her and determine if she wants to do a 30 day or 90 day supply.

## 2018-04-16 NOTE — Telephone Encounter (Signed)
Please advise 

## 2018-04-18 MED ORDER — HYDROCHLOROTHIAZIDE 25 MG PO TABS: 25.0000 mg | ORAL_TABLET | Freq: Every day | ORAL | 3 refills | Status: DC

## 2018-04-18 MED ORDER — LOSARTAN POTASSIUM 100 MG PO TABS: 100.0000 mg | ORAL_TABLET | Freq: Every day | ORAL | 3 refills | Status: DC

## 2018-04-18 NOTE — Addendum Note (Signed)
Addended by: Rebecca Eaton on: 04/18/2018 03:04 PM   Modules accepted: Orders

## 2018-04-25 ENCOUNTER — Other Ambulatory Visit: Payer: Self-pay

## 2018-04-25 MED ORDER — TELMISARTAN 40 MG PO TABS: 40.0000 mg | ORAL_TABLET | Freq: Every day | ORAL | 1 refills | Status: DC

## 2018-04-25 NOTE — Telephone Encounter (Signed)
Please encourage her to check numbers at home and report to [place]. Everyone responds a little differently to changes in med, so just want to make sure we are still controlling her well. She can message me in a couple of weeks if she would like (or call)

## 2018-04-25 NOTE — Telephone Encounter (Signed)
Losartan is on back order. Pharmacy would like to change to Telmisartan.  Ok to change?

## 2018-07-05 ENCOUNTER — Other Ambulatory Visit: Payer: Self-pay | Admitting: Family Medicine

## 2018-07-07 ENCOUNTER — Other Ambulatory Visit: Payer: Self-pay | Admitting: Family Medicine

## 2018-07-08 ENCOUNTER — Encounter: Payer: Self-pay | Admitting: Family Medicine

## 2018-07-09 MED ORDER — AMLODIPINE BESYLATE 2.5 MG PO TABS: 2.5000 mg | ORAL_TABLET | Freq: Every day | ORAL | 0 refills | Status: DC

## 2018-07-10 ENCOUNTER — Other Ambulatory Visit: Payer: Self-pay | Admitting: Family Medicine

## 2018-07-10 ENCOUNTER — Encounter: Payer: Self-pay | Admitting: Family Medicine

## 2018-07-10 ENCOUNTER — Ambulatory Visit (INDEPENDENT_AMBULATORY_CARE_PROVIDER_SITE_OTHER): Payer: Medicare HMO | Admitting: Family Medicine

## 2018-07-10 ENCOUNTER — Other Ambulatory Visit: Payer: Self-pay

## 2018-07-10 VITALS — BP 130/70

## 2018-07-10 DIAGNOSIS — K222 Esophageal obstruction: Secondary | ICD-10-CM | POA: Diagnosis not present

## 2018-07-10 DIAGNOSIS — M150 Primary generalized (osteo)arthritis: Secondary | ICD-10-CM

## 2018-07-10 DIAGNOSIS — R7309 Other abnormal glucose: Secondary | ICD-10-CM

## 2018-07-10 DIAGNOSIS — M159 Polyosteoarthritis, unspecified: Secondary | ICD-10-CM

## 2018-07-10 DIAGNOSIS — E785 Hyperlipidemia, unspecified: Secondary | ICD-10-CM

## 2018-07-10 DIAGNOSIS — I10 Essential (primary) hypertension: Secondary | ICD-10-CM

## 2018-07-10 MED ORDER — LOSARTAN POTASSIUM-HCTZ 100-25 MG PO TABS: 1.0000 | ORAL_TABLET | Freq: Every day | ORAL | 1 refills | Status: DC

## 2018-07-10 MED ORDER — PANTOPRAZOLE SODIUM 40 MG PO TBEC: 40.0000 mg | DELAYED_RELEASE_TABLET | Freq: Every day | ORAL | 1 refills | Status: DC

## 2018-07-10 MED ORDER — AMLODIPINE BESYLATE 2.5 MG PO TABS: 2.5000 mg | ORAL_TABLET | Freq: Every day | ORAL | 1 refills | Status: DC

## 2018-07-10 NOTE — Progress Notes (Signed)
Virtual Visit via Video Note  I connected with Robin Francis  on 07/10/18 at 11:30 AM EDT by a video enabled telemedicine application and verified that I am speaking with the correct person using two identifiers.  Location patient: home Location provider:work or home office Persons participating in the virtual visit: patient, provider  I discussed the limitations of evaluation and management by telemedicine and the availability of in person appointments. The patient expressed understanding and agreed to proceed.  Video component was not working; visit was converted to telephone visit.  Robin Francis DOB: Dec 23, 1945 Encounter date: 07/10/2018  This is a 73 y.o. female who presents with Chief Complaint  Patient presents with  . Hypertension  . Gastroesophageal Reflux    History of present illness:  HPI  Things have been doing well since change in bp medication - in mid 130's over 70's [facility] was out of combo pill (losartan-hctz) so was trying individual components for awhile, but combo back in stock. Alternating medications (splits losartan hctz in morning, amlodipine in evening) and this is helping regulate bp better and keeping away dizzy spells which were happening when she took all at same time.   Needs refill on protonix as well; this is working well for her.   Has mammogram scheduled next week.  Repeat colonoscopy due 01/2022  Arthritis is doing pretty well. Not using/taking anything for this. voltaren gel rarely when bothering her.   Allergies  Allergen Reactions  . Lisinopril Other (See Comments)    Cough   No outpatient medications have been marked as taking for the 07/10/18 encounter (Office Visit) with Robin Macadam, MD.    Review of Systems  Constitutional: Negative for chills, fatigue and fever.  Respiratory: Negative for cough, chest tightness, shortness of breath and wheezing.   Cardiovascular: Negative for chest pain, palpitations and leg swelling.   Neurological: Negative for dizziness and headaches.    Objective:  BP 130/70       BP Readings from Last 3 Encounters:  07/10/18 130/70  12/24/17 140/90  11/07/17 122/70   Wt Readings from Last 3 Encounters:  12/24/17 166 lb 12.8 oz (75.7 kg)  11/07/17 167 lb (75.8 kg)  01/09/17 162 lb (73.5 kg)    EXAM:  GENERAL: alert, oriented, appears well and in no acute distress  LUNGS: no obvious SOB while talking with her on phone  Assessment/Plan 1. Essential hypertension, benign Improved control. Continue medications and home checking.  - losartan-hydrochlorothiazide (HYZAAR) 100-25 MG tablet; Take 1 tablet by mouth daily.  Dispense: 90 tablet; Refill: 1 - amLODipine (NORVASC) 2.5 MG tablet; Take 1 tablet (2.5 mg total) by mouth daily.  Dispense: 90 tablet; Refill: 1 - CBC with Differential/Platelet; Future - Comprehensive metabolic panel; Future  2. Primary osteoarthritis involving multiple joints Stable.   3. Elevated glucose Noted on previous bloodwork; will add on A1C to future bloodwork - Hemoglobin A1c; Future  4. Hyperlipidemia, unspecified hyperlipidemia type Has been stable not on medication; will plan to recheck for stability. - Lipid panel; Future - TSH; Future  5. Stricture and stenosis of esophagus Does well with GERD sx on the protonix.  - pantoprazole (PROTONIX) 40 MG tablet; Take 1 tablet (40 mg total) by mouth daily.  Dispense: 90 tablet; Refill: 1    Return in about 3 months (around 10/10/2018) for Chronic condition visit/AWV, bloodwork.   I discussed the assessment and treatment plan with the patient. The patient was provided an opportunity to ask questions and all  were answered. The patient agreed with the plan and demonstrated an understanding of the instructions.   The patient was advised to call back or seek an in-person evaluation if the symptoms worsen or if the condition fails to improve as anticipated.  I provided 15 minutes of  non-face-to-face time during this encounter.   Robin Rough, MD

## 2018-07-11 MED ORDER — PANTOPRAZOLE SODIUM 40 MG PO TBEC: 40.0000 mg | DELAYED_RELEASE_TABLET | Freq: Every day | ORAL | 0 refills | Status: DC

## 2018-07-19 ENCOUNTER — Encounter: Payer: Self-pay | Admitting: Gynecology

## 2018-07-19 DIAGNOSIS — Z1231 Encounter for screening mammogram for malignant neoplasm of breast: Secondary | ICD-10-CM | POA: Diagnosis not present

## 2018-07-19 DIAGNOSIS — Z803 Family history of malignant neoplasm of breast: Secondary | ICD-10-CM | POA: Diagnosis not present

## 2018-07-29 DIAGNOSIS — R69 Illness, unspecified: Secondary | ICD-10-CM | POA: Diagnosis not present

## 2018-10-01 ENCOUNTER — Other Ambulatory Visit: Payer: Self-pay | Admitting: Family Medicine

## 2018-10-10 ENCOUNTER — Other Ambulatory Visit (INDEPENDENT_AMBULATORY_CARE_PROVIDER_SITE_OTHER): Payer: Medicare HMO

## 2018-10-10 ENCOUNTER — Other Ambulatory Visit: Payer: Self-pay

## 2018-10-10 DIAGNOSIS — R7309 Other abnormal glucose: Secondary | ICD-10-CM

## 2018-10-10 DIAGNOSIS — E785 Hyperlipidemia, unspecified: Secondary | ICD-10-CM

## 2018-10-10 DIAGNOSIS — I10 Essential (primary) hypertension: Secondary | ICD-10-CM | POA: Diagnosis not present

## 2018-10-10 LAB — COMPREHENSIVE METABOLIC PANEL
ALT: 23 U/L (ref 0–35)
AST: 21 U/L (ref 0–37)
Albumin: 4.3 g/dL (ref 3.5–5.2)
Alkaline Phosphatase: 74 U/L (ref 39–117)
BUN: 16 mg/dL (ref 6–23)
CO2: 32 mEq/L (ref 19–32)
Calcium: 9.9 mg/dL (ref 8.4–10.5)
Chloride: 100 mEq/L (ref 96–112)
Creatinine, Ser: 0.71 mg/dL (ref 0.40–1.20)
GFR: 81.05 mL/min (ref >60.00)
Glucose, Bld: 93 mg/dL (ref 70–99)
Potassium: 4.3 mEq/L (ref 3.5–5.1)
Sodium: 141 mEq/L (ref 135–145)
Total Bilirubin: 0.9 mg/dL (ref 0.2–1.2)
Total Protein: 6.7 g/dL (ref 6.0–8.3)

## 2018-10-10 LAB — CBC WITH DIFFERENTIAL/PLATELET
Basophils Absolute: 0 10*3/uL (ref 0.0–0.1)
Basophils Relative: 0.4 % (ref 0.0–3.0)
Eosinophils Absolute: 0.1 10*3/uL (ref 0.0–0.7)
Eosinophils Relative: 1.8 % (ref 0.0–5.0)
HCT: 39.3 % (ref 36.0–46.0)
Hemoglobin: 13.5 g/dL (ref 12.0–15.0)
Lymphocytes Relative: 34.1 % (ref 12.0–46.0)
Lymphs Abs: 2.9 10*3/uL (ref 0.7–4.0)
MCHC: 34.4 g/dL (ref 30.0–36.0)
MCV: 90.6 fl (ref 78.0–100.0)
Monocytes Absolute: 0.7 10*3/uL (ref 0.1–1.0)
Monocytes Relative: 8.3 % (ref 3.0–12.0)
Neutro Abs: 4.6 10*3/uL (ref 1.4–7.7)
Neutrophils Relative %: 55.4 % (ref 43.0–77.0)
Platelets: 308 10*3/uL (ref 150.0–400.0)
RBC: 4.33 Mil/uL (ref 3.87–5.11)
RDW: 13 % (ref 11.5–15.5)
WBC: 8.4 10*3/uL (ref 4.0–10.5)

## 2018-10-10 LAB — LIPID PANEL
Cholesterol: 236 mg/dL — ABNORMAL HIGH (ref 0–200)
HDL: 61 mg/dL (ref >39.00)
LDL Cholesterol: 156 mg/dL — ABNORMAL HIGH (ref 0–99)
NonHDL: 174.8
Total CHOL/HDL Ratio: 4
Triglycerides: 94 mg/dL (ref 0.0–149.0)
VLDL: 18.8 mg/dL (ref 0.0–40.0)

## 2018-10-10 LAB — HEMOGLOBIN A1C: Hgb A1c MFr Bld: 5.8 % (ref 4.6–6.5)

## 2018-10-10 LAB — TSH: TSH: 2.33 u[IU]/mL (ref 0.35–4.50)

## 2018-10-15 ENCOUNTER — Other Ambulatory Visit: Payer: Self-pay

## 2018-10-16 ENCOUNTER — Ambulatory Visit (INDEPENDENT_AMBULATORY_CARE_PROVIDER_SITE_OTHER): Payer: Medicare HMO | Admitting: Family Medicine

## 2018-10-16 ENCOUNTER — Encounter: Payer: Self-pay | Admitting: Family Medicine

## 2018-10-16 ENCOUNTER — Other Ambulatory Visit: Payer: Self-pay

## 2018-10-16 VITALS — BP 118/70 | HR 67 | Temp 97.3°F | Ht <= 58 in | Wt 165.9 lb

## 2018-10-16 DIAGNOSIS — Z0000 Encounter for general adult medical examination without abnormal findings: Secondary | ICD-10-CM | POA: Diagnosis not present

## 2018-10-16 DIAGNOSIS — E785 Hyperlipidemia, unspecified: Secondary | ICD-10-CM | POA: Diagnosis not present

## 2018-10-16 DIAGNOSIS — I10 Essential (primary) hypertension: Secondary | ICD-10-CM

## 2018-10-16 DIAGNOSIS — H9191 Unspecified hearing loss, right ear: Secondary | ICD-10-CM | POA: Diagnosis not present

## 2018-10-16 NOTE — Progress Notes (Signed)
Patient: Robin Francis, Female    DOB: 02/19/1945, 73 y.o.   MRN: [id number] Visit Date: 10/16/2018  Today's Provider: Micheline Rough, MD   Chief Complaint  Patient presents with  . Annual Exam   Subjective:   Initial preventative physical exam  Robin Francis  is a 73 y.o. female who presents today for her AWV. She feels well. She reports exercising regularly at least 5 days/week with walking. Was doing classes at gym but had to stop due to [place]. She reports she is sleeping well.  HPI Living arrangements - the patient lives with their spouse. Hearing screen: no concerns with hearing; but knows decreased. Not interested in specialty evaluation. Vision screen: no concerns with vision; follows with eye doc and has ophthalmology appt next month - bradley bowen.  Advance Directives/Living Will: yes; has this at home   Follows with Dr. Phineas Real regularly. Last bone density 11/2016. Osteopenia. Last mammogram was 07/2018.   Review of Systems  Social History   Socioeconomic History  . Marital status: Married    Spouse name: Not on file  . Number of children: Not on file  . Years of education: Not on file  . Highest education level: Not on file  Occupational History  . Not on file  Social Needs  . Financial resource strain: Not on file  . Food insecurity    Worry: Not on file    Inability: Not on file  . Transportation needs    Medical: Not on file    Non-medical: Not on file  Tobacco Use  . Smoking status: Never Smoker  . Smokeless tobacco: Never Used  Substance and Sexual Activity  . Alcohol use: Yes    Alcohol/week: 4.0 standard drinks    Types: 4 Standard drinks or equivalent per week    Comment: occassionally-social  . Drug use: No  . Sexual activity: Not Currently    Birth control/protection: Post-menopausal    Comment: 1st intercourse 73 yo-Fewer than 5 partners  Lifestyle  . Physical activity    Days per week: Not on file    Minutes per session: Not on file   . Stress: Not on file  Relationships  . Social Herbalist on phone: Not on file    Gets together: Not on file    Attends religious service: Not on file    Active member of club or organization: Not on file    Attends meetings of clubs or organizations: Not on file    Relationship status: Not on file  . Intimate partner violence    Fear of current or ex partner: Not on file    Emotionally abused: Not on file    Physically abused: Not on file    Forced sexual activity: Not on file  Other Topics Concern  . Not on file  Social History Narrative  . Not on file    Patient Active Problem List   Diagnosis Date Noted  . Elevated glucose 07/10/2018  . Hyperlipidemia 07/10/2018  . Stricture and stenosis of esophagus 01/06/2013  . Special screening for malignant neoplasms, colon 11/07/2012  . Essential hypertension, benign 04/04/2012  . Low bone mass   . Postop Acute blood loss anemia 04/13/2011  . OA (osteoarthritis) of knee 04/03/2011  . MENOPAUSAL SYNDROME 09/17/2008  . Osteoarthritis 09/17/2008    Past Surgical History:  Procedure Laterality Date  . BACK SURGERY     LOWER  . KNEE ARTHROSCOPY     left  .  PARTIAL KNEE ARTHROPLASTY Right 04/19/2015   Procedure: UNICOMPARTMENTAL KNEE MEDIAL REPLACMENT;  Surgeon: Gaynelle Arabian, MD;  Location: [facility];  Service: Orthopedics;  Laterality: Right;  . TONSILLECTOMY     as child  . TOTAL KNEE ARTHROPLASTY  04/03/2011   Procedure: TOTAL KNEE ARTHROPLASTY;  Surgeon: Gearlean Alf, MD;  Location: [facility];  Service: Orthopedics;  Laterality: Left;  . TRIGGER FINGER RELEASE Left     Her family history includes Breast cancer in her maternal grandmother; High blood pressure in her brother; Other in her father; Pancreatic cancer in her mother.     Previous Medications   ACETAMINOPHEN (TYLENOL) 650 MG CR TABLET    Take 1,300 mg by mouth every morning.   AMLODIPINE (NORVASC) 2.5 MG TABLET    Take 1 tablet (2.5 mg total) by mouth  daily.   ASCORBIC ACID (VITAMIN C) 1000 MG TABLET    Take 1,000 mg by mouth daily.   CALCIUM CARBONATE (CALCIUM 600) 600 MG TABS TABLET    Take 600 mg by mouth 2 (two) times daily with a meal.   CETIRIZINE (ZYRTEC) 10 MG TABLET    Take 10 mg by mouth daily.   CHOLECALCIFEROL (VITAMIN D) 2000 UNITS TABLET    Take 2,000 Units by mouth daily.   CINNAMON PO    Take 1,000 mg by mouth daily.   COENZYME Q10 (CO Q 10 PO)    Take 120 mg by mouth.   DICLOFENAC SODIUM (VOLTAREN) 1 % GEL    Apply topically 4 (four) times daily.   LOSARTAN-HYDROCHLOROTHIAZIDE (HYZAAR) 100-25 MG TABLET    Take 1 tablet by mouth daily.   MULTIPLE VITAMIN (MULTIVITAMIN) TABLET    Take 1 tablet by mouth daily.   OMEGA 3-6-9 FATTY ACIDS (OMEGA 3-6-9 COMPLEX PO)    Take by mouth.   OVER THE COUNTER MEDICATION       PANTOPRAZOLE (PROTONIX) 40 MG TABLET    TAKE 1 TABLET BY MOUTH EVERY DAY   RED YEAST RICE EXTRACT (RED YEAST RICE PO)    Take 240 mg by mouth 2 (two) times daily.   THIAMINE (VITAMIN B-1) 100 MG TABLET    Take 100 mg by mouth daily.   VITAMIN E 400 UNIT CAPSULE    Take 400 Units by mouth daily.    Patient Care Team: Caren Macadam, MD as PCP - General (Family Medicine) Dermatology, Presley Raddle, Leory Plowman, MD as Consulting Physician (Ophthalmology)      Objective:   Vitals: BP 118/70 (BP Location: Left Arm, Patient Position: Sitting, Cuff Size: Large)   Pulse 67   Temp (!) 97.3 F (36.3 C) (Temporal)   Ht 4' 8.75" (1.441 m)   Wt 165 lb 14.4 oz (75.3 kg)   SpO2 98%   BMI 36.22 kg/m   Physical Exam Constitutional:      General: She is not in acute distress.    Appearance: She is well-developed.  HENT:     Head: Normocephalic and atraumatic.     Right Ear: External ear normal.     Left Ear: External ear normal.     Mouth/Throat:     Pharynx: No oropharyngeal exudate.  Eyes:     Conjunctiva/sclera: Conjunctivae normal.     Pupils: Pupils are equal, round, and reactive to light.  Neck:      Musculoskeletal: Normal range of motion and neck supple.     Thyroid: No thyromegaly.  Cardiovascular:     Rate and Rhythm: Normal rate and  regular rhythm.     Heart sounds: Normal heart sounds. No murmur. No friction rub. No gallop.   Pulmonary:     Effort: Pulmonary effort is normal.     Breath sounds: Normal breath sounds.  Abdominal:     General: Bowel sounds are normal. There is no distension.     Palpations: Abdomen is soft. There is no mass.     Tenderness: There is no abdominal tenderness. There is no guarding.     Hernia: No hernia is present.  Musculoskeletal: Normal range of motion.        General: No tenderness or deformity.  Lymphadenopathy:     Cervical: No cervical adenopathy.  Skin:    General: Skin is warm and dry.     Findings: No rash.  Neurological:     Mental Status: She is alert and oriented to person, place, and time.     Deep Tendon Reflexes: Reflexes normal.     Reflex Scores:      Tricep reflexes are 2+ on the right side and 2+ on the left side.      Bicep reflexes are 2+ on the right side and 2+ on the left side.      Brachioradialis reflexes are 2+ on the right side and 2+ on the left side.      Patellar reflexes are 2+ on the right side and 2+ on the left side. Psychiatric:        Speech: Speech normal.        Behavior: Behavior normal.        Thought Content: Thought content normal.      No exam data present  Activities of Daily Living No flowsheet data found.  Fall Risk Assessment Fall Risk  10/16/2018 12/20/2015 11/17/2014 10/06/2013 10/03/2012  Falls in the past year? 0 No Yes No No  Number falls in past yr: 0 - 1 - -  Injury with Fall? 0 - No - -  Risk for fall due to : - - Impaired balance/gait - -     Patient reports there are not safety devices in place in shower at home.   Depression Screen PHQ 2/9 Scores 10/16/2018 12/20/2015 11/17/2014 10/06/2013  PHQ - 2 Score 0 0 0 0    Cognitive Testing - 6-CIT: declines memory testing today;  no concerns with her memory  Health Maintenance  Topic Date Due  . INFLUENZA VACCINE  09/07/2018  . MAMMOGRAM  07/19/2019  . TETANUS/TDAP  09/29/2020  . COLONOSCOPY  01/09/2022  . DEXA SCAN  Completed  . Hepatitis C Screening  Completed  . PNA vac Low Risk Adult  Completed    Assessment & Plan:     Initial Preventative Physical Exam  Reviewed patient's Family Medical History Reviewed and updated list of patient's medical providers Assessment of cognitive impairment was done- declined Assessed patient's functional ability Established a written schedule for health screening [place] Completed and Reviewed  Exercise Activities and Dietary recommendations Goals : keep up daily exercise!  None     Immunization History  Administered Date(s) Administered  . Influenza, High Dose Seasonal PF 11/17/2014, 12/20/2015, 12/21/2016  . Pneumococcal Conjugate-13 10/06/2013  . Pneumococcal Polysaccharide-23 10/03/2012  . Tdap 09/30/2010  . Zoster Recombinat (Shingrix) 07/16/2017, 10/24/2017    Health Maintenance  Topic Date Due  . INFLUENZA VACCINE  09/07/2018  . MAMMOGRAM  07/19/2019  . TETANUS/TDAP  09/29/2020  . COLONOSCOPY  01/09/2022  . DEXA SCAN  Completed  .  Hepatitis C Screening  Completed  . PNA vac Low Risk Adult  Completed     Discussed health benefits of physical activity, and encouraged her to engage in regular exercise appropriate for her age and condition.    ------------------------------------------------------------------------------------------------------------  1. Encounter for Medicare annual wellness exam Has appointment with eye doc next month for follow up. Declines referral for hearing at this time; states this Is longer term issue and she compensates well with other ear.   2. Essential hypertension, benign Controlled. Continue current medication. - CBC with Differential/Platelet; Future - Comprehensive metabolic panel;  Future  3. Hyperlipidemia, unspecified hyperlipidemia type - Lipid panel; Future  Return in about 6 months (around 04/15/2019) for Chronic condition visit; bloodwork prior.

## 2018-10-16 NOTE — Patient Instructions (Signed)

## 2018-10-28 DIAGNOSIS — R69 Illness, unspecified: Secondary | ICD-10-CM | POA: Diagnosis not present

## 2018-11-07 DIAGNOSIS — H2513 Age-related nuclear cataract, bilateral: Secondary | ICD-10-CM | POA: Diagnosis not present

## 2018-11-07 DIAGNOSIS — H524 Presbyopia: Secondary | ICD-10-CM | POA: Diagnosis not present

## 2018-11-08 ENCOUNTER — Other Ambulatory Visit: Payer: Self-pay

## 2018-11-11 ENCOUNTER — Encounter: Payer: Self-pay | Admitting: Gynecology

## 2018-11-11 ENCOUNTER — Ambulatory Visit (INDEPENDENT_AMBULATORY_CARE_PROVIDER_SITE_OTHER): Payer: Medicare HMO | Admitting: Gynecology

## 2018-11-11 ENCOUNTER — Other Ambulatory Visit: Payer: Self-pay

## 2018-11-11 VITALS — BP 124/80 | Ht <= 58 in | Wt 165.0 lb

## 2018-11-11 DIAGNOSIS — Z23 Encounter for immunization: Secondary | ICD-10-CM | POA: Diagnosis not present

## 2018-11-11 DIAGNOSIS — M8580 Other specified disorders of bone density and structure, unspecified site: Secondary | ICD-10-CM

## 2018-11-11 DIAGNOSIS — Z01419 Encounter for gynecological examination (general) (routine) without abnormal findings: Secondary | ICD-10-CM | POA: Diagnosis not present

## 2018-11-11 DIAGNOSIS — N952 Postmenopausal atrophic vaginitis: Secondary | ICD-10-CM

## 2018-11-11 NOTE — Patient Instructions (Signed)
Follow-up for the bone density as scheduled.  Follow-up in 1 year for annual exam 

## 2018-11-11 NOTE — Progress Notes (Signed)
    Robin Francis 1945/05/25 [id number]        73 y.o.  [id number] for breast and pelvic exam.  Without gynecologic complaints  Past medical history,surgical history, problem list, medications, allergies, family history and social history were all reviewed and documented as reviewed in the EPIC chart.  ROS:  Performed with pertinent positives and negatives included in the history, assessment and plan.   Additional significant findings : None   Exam: Caryn Bee assistant Vitals:   11/11/18 1439  BP: 124/80  Weight: 165 lb (74.8 kg)  Height: 4\' 9"  (1.448 m)   Body mass index is 35.71 kg/m.  General appearance:  Normal affect, orientation and appearance. Skin: Grossly normal HEENT: Without gross lesions.  No cervical or supraclavicular adenopathy. Thyroid normal.  Lungs:  Clear without wheezing, rales or rhonchi Cardiac: RR, without RMG Abdominal:  Soft, nontender, without masses, guarding, rebound, organomegaly or hernia Breasts:  Examined lying and sitting without masses, retractions, discharge or axillary adenopathy. Pelvic:  Ext, BUS, Vagina: With atrophic changes  Cervix: With atrophic changes  Uterus: Difficult to palpate but no gross masses or tenderness, normal size  Adnexa: Without masses or tenderness    Anus and perineum: Normal   Rectovaginal: Normal sphincter tone without palpated masses or tenderness.    Assessment/Plan:  73 y.o. [id number] female for breast and pelvic exam  1. Postmenopausal.  No significant menopausal symptoms or any vaginal bleeding. 2. Osteopenia.  DEXA 2018 T score -1.4 FRAX 8.8% / 1.8%.  Recommend follow-up DEXA this coming year and patient will schedule in follow-up for this. 3. Colonoscopy 2018.  Repeat at their recommended interval. 4. Mammography 07/2018.  Continue with annual mammography when due.  Breast exam normal today. 5. Pap smear 11/2016.  No Pap smear done today.  No history of abnormal Pap smears.  Options to stop screening per  current screening guidelines reviewed versus less frequent screening intervals.  Will readdress on annual basis. 6. Health maintenance.  No routine lab work done as patient does this elsewhere.  Follow-up 1 year, sooner as needed.   Anastasio Auerbach MD, 2:59 PM 11/11/2018

## 2018-11-13 ENCOUNTER — Encounter: Payer: Self-pay | Admitting: Gynecology

## 2018-12-02 ENCOUNTER — Other Ambulatory Visit: Payer: Self-pay

## 2018-12-03 ENCOUNTER — Encounter: Payer: Self-pay | Admitting: Gynecology

## 2018-12-03 ENCOUNTER — Other Ambulatory Visit: Payer: Self-pay | Admitting: Gynecology

## 2018-12-03 ENCOUNTER — Ambulatory Visit (INDEPENDENT_AMBULATORY_CARE_PROVIDER_SITE_OTHER): Payer: Medicare HMO

## 2018-12-03 DIAGNOSIS — Z780 Asymptomatic menopausal state: Secondary | ICD-10-CM | POA: Diagnosis not present

## 2018-12-03 DIAGNOSIS — M8580 Other specified disorders of bone density and structure, unspecified site: Secondary | ICD-10-CM

## 2018-12-03 DIAGNOSIS — M8589 Other specified disorders of bone density and structure, multiple sites: Secondary | ICD-10-CM | POA: Diagnosis not present

## 2018-12-12 DIAGNOSIS — D2261 Melanocytic nevi of right upper limb, including shoulder: Secondary | ICD-10-CM | POA: Diagnosis not present

## 2018-12-12 DIAGNOSIS — C44519 Basal cell carcinoma of skin of other part of trunk: Secondary | ICD-10-CM | POA: Diagnosis not present

## 2018-12-12 DIAGNOSIS — Z85828 Personal history of other malignant neoplasm of skin: Secondary | ICD-10-CM | POA: Diagnosis not present

## 2018-12-12 DIAGNOSIS — L820 Inflamed seborrheic keratosis: Secondary | ICD-10-CM | POA: Diagnosis not present

## 2018-12-12 DIAGNOSIS — L821 Other seborrheic keratosis: Secondary | ICD-10-CM | POA: Diagnosis not present

## 2018-12-12 DIAGNOSIS — C44712 Basal cell carcinoma of skin of right lower limb, including hip: Secondary | ICD-10-CM | POA: Diagnosis not present

## 2018-12-12 DIAGNOSIS — D225 Melanocytic nevi of trunk: Secondary | ICD-10-CM | POA: Diagnosis not present

## 2018-12-12 DIAGNOSIS — D2262 Melanocytic nevi of left upper limb, including shoulder: Secondary | ICD-10-CM | POA: Diagnosis not present

## 2018-12-12 DIAGNOSIS — L814 Other melanin hyperpigmentation: Secondary | ICD-10-CM | POA: Diagnosis not present

## 2018-12-19 ENCOUNTER — Encounter: Payer: Self-pay | Admitting: Family Medicine

## 2018-12-23 ENCOUNTER — Other Ambulatory Visit: Payer: Self-pay | Admitting: Family Medicine

## 2019-01-21 ENCOUNTER — Other Ambulatory Visit: Payer: Self-pay | Admitting: Family Medicine

## 2019-01-21 DIAGNOSIS — I10 Essential (primary) hypertension: Secondary | ICD-10-CM

## 2019-03-26 ENCOUNTER — Other Ambulatory Visit: Payer: Self-pay | Admitting: Family Medicine

## 2019-03-26 DIAGNOSIS — I10 Essential (primary) hypertension: Secondary | ICD-10-CM

## 2019-04-01 ENCOUNTER — Other Ambulatory Visit: Payer: Self-pay | Admitting: Family Medicine

## 2019-04-02 DIAGNOSIS — R69 Illness, unspecified: Secondary | ICD-10-CM | POA: Diagnosis not present

## 2019-04-15 ENCOUNTER — Other Ambulatory Visit: Payer: Self-pay | Admitting: Family Medicine

## 2019-04-15 DIAGNOSIS — I10 Essential (primary) hypertension: Secondary | ICD-10-CM

## 2019-06-19 ENCOUNTER — Other Ambulatory Visit: Payer: Self-pay | Admitting: Family Medicine

## 2019-06-19 DIAGNOSIS — I10 Essential (primary) hypertension: Secondary | ICD-10-CM

## 2019-06-23 DIAGNOSIS — R69 Illness, unspecified: Secondary | ICD-10-CM | POA: Diagnosis not present

## 2019-06-25 ENCOUNTER — Other Ambulatory Visit: Payer: Self-pay | Admitting: Family Medicine

## 2019-07-06 ENCOUNTER — Other Ambulatory Visit: Payer: Self-pay | Admitting: Family Medicine

## 2019-07-06 DIAGNOSIS — I10 Essential (primary) hypertension: Secondary | ICD-10-CM

## 2019-07-08 ENCOUNTER — Other Ambulatory Visit: Payer: Self-pay | Admitting: Family Medicine

## 2019-07-24 ENCOUNTER — Encounter: Payer: Self-pay | Admitting: Obstetrics and Gynecology

## 2019-07-24 DIAGNOSIS — Z1231 Encounter for screening mammogram for malignant neoplasm of breast: Secondary | ICD-10-CM | POA: Diagnosis not present

## 2019-09-13 ENCOUNTER — Other Ambulatory Visit: Payer: Self-pay | Admitting: Family Medicine

## 2019-09-13 DIAGNOSIS — I10 Essential (primary) hypertension: Secondary | ICD-10-CM

## 2019-10-02 ENCOUNTER — Other Ambulatory Visit: Payer: Self-pay | Admitting: Family Medicine

## 2019-10-02 DIAGNOSIS — I10 Essential (primary) hypertension: Secondary | ICD-10-CM

## 2019-10-21 DIAGNOSIS — R69 Illness, unspecified: Secondary | ICD-10-CM | POA: Diagnosis not present

## 2019-11-10 DIAGNOSIS — H2513 Age-related nuclear cataract, bilateral: Secondary | ICD-10-CM | POA: Diagnosis not present

## 2019-11-10 DIAGNOSIS — H524 Presbyopia: Secondary | ICD-10-CM | POA: Diagnosis not present

## 2019-11-11 DIAGNOSIS — R69 Illness, unspecified: Secondary | ICD-10-CM | POA: Diagnosis not present

## 2019-11-12 ENCOUNTER — Other Ambulatory Visit: Payer: Self-pay

## 2019-11-12 ENCOUNTER — Ambulatory Visit (INDEPENDENT_AMBULATORY_CARE_PROVIDER_SITE_OTHER): Payer: Medicare HMO | Admitting: Obstetrics and Gynecology

## 2019-11-12 ENCOUNTER — Encounter: Payer: Self-pay | Admitting: Obstetrics and Gynecology

## 2019-11-12 VITALS — BP 124/80 | Ht <= 58 in | Wt 166.0 lb

## 2019-11-12 DIAGNOSIS — M8580 Other specified disorders of bone density and structure, unspecified site: Secondary | ICD-10-CM

## 2019-11-12 DIAGNOSIS — Z01419 Encounter for gynecological examination (general) (routine) without abnormal findings: Secondary | ICD-10-CM | POA: Diagnosis not present

## 2019-11-12 NOTE — Progress Notes (Signed)
FLOYE FESLER [birth year] [id number]  SUBJECTIVE:  74 y.o. [id number] female here for a breast and pelvic exam. She has no gynecologic concerns.  Current Outpatient Medications  Medication Sig Dispense Refill  . acetaminophen (TYLENOL) 650 MG CR tablet Take 1,300 mg by mouth every morning.    Marland Kitchen amLODipine (NORVASC) 2.5 MG tablet TAKE 1 TABLET BY MOUTH EVERY DAY 90 tablet 0  . Ascorbic Acid (VITAMIN C) 1000 MG tablet Take 1,000 mg by mouth daily.    . calcium carbonate (CALCIUM 600) 600 MG TABS tablet Take 600 mg by mouth 2 (two) times daily with a meal.    . cetirizine (ZYRTEC) 10 MG tablet Take 10 mg by mouth daily.    . Cholecalciferol (VITAMIN D) 2000 units tablet Take 2,000 Units by mouth daily.    Marland Kitchen CINNAMON PO Take 1,000 mg by mouth daily.    . Coenzyme Q10 (CO Q 10 PO) Take 120 mg by mouth.    . losartan-hydrochlorothiazide (HYZAAR) 100-25 MG tablet TAKE 1 TABLET BY MOUTH EVERY DAY 90 tablet 0  . Multiple Vitamin (MULTIVITAMIN) tablet Take 1 tablet by mouth daily.    . Omega 3-6-9 Fatty Acids (OMEGA 3-6-9 COMPLEX PO) Take by mouth.    Marland Kitchen OVER THE COUNTER MEDICATION     . pantoprazole (PROTONIX) 40 MG tablet TAKE 1 TABLET BY MOUTH EVERY DAY 90 tablet 0  . Red Yeast Rice Extract (RED YEAST RICE PO) Take 240 mg by mouth 2 (two) times daily.    Marland Kitchen thiamine (VITAMIN B-1) 100 MG tablet Take 100 mg by mouth daily.    . vitamin E 400 UNIT capsule Take 400 Units by mouth daily.     No current facility-administered medications for this visit.   Allergies: Lisinopril  No LMP recorded. Patient is postmenopausal.  Past medical history,surgical history, problem list, medications, allergies, family history and social history were all reviewed and documented as reviewed in the EPIC chart.  GYN ROS: no abnormal bleeding, pelvic pain or discharge, no breast pain or new or enlarging lumps on self exam.  No dysuria, urinary frequency, pain with urination, cloudy/malodorous urine.   OBJECTIVE:  Ht  4\' 9"  (1.448 m)   Wt 166 lb (75.3 kg)   BMI 35.92 kg/m  The patient appears well, alert, oriented, in no distress.  BREAST EXAM: breasts appear normal, no suspicious masses, no skin or nipple changes or axillary nodes  PELVIC EXAM: VULVA: normal appearing vulva with atrophic change, no masses, tenderness or lesions, VAGINA: normal appearing vagina with atrophic change, normal color and discharge, no lesions, CERVIX: normal appearing atrophic cervix without discharge or lesions, UTERUS: uterus is normal size, shape, consistency and nontender, ADNEXA: normal adnexa in size, nontender and no masses  Chaperone: Caryn Bee present during the examination  ASSESSMENT:  74 y.o. [id number] here for a breast and pelvic exam  PLAN:   1. Postmenopausal.  No significant hot flashes or night sweats.  No vaginal bleeding. 2. Pap smear 11/2016.  No significant history of abnormal Pap smears. We discussed current screening guidelines and based on age criteria she does not need to continue and we discussed the rationale. She is comfortable with not continuing with Pap smears based on the guidelines. 3. Mammogram 07/2019.  Normal breast exam today.   Continue with annual mammograms when due. 4. Colonoscopy 2018.  She will follow up at the interval recommended by her GI specialist.   5. Osteopenia. DEXA 11/2018, T score -1.7, FRAX 8.9% /  1.1%.  Next DEXA recommended at the 2-year interval. 6. Health maintenance.  No labs today as she normally has these completed elsewhere.  Return annually or sooner, prn.  Joseph Pierini MD 11/12/19

## 2019-11-25 ENCOUNTER — Other Ambulatory Visit: Payer: Self-pay | Admitting: Family Medicine

## 2019-12-10 ENCOUNTER — Other Ambulatory Visit: Payer: Self-pay | Admitting: Family Medicine

## 2019-12-10 DIAGNOSIS — I10 Essential (primary) hypertension: Secondary | ICD-10-CM

## 2019-12-31 ENCOUNTER — Ambulatory Visit (INDEPENDENT_AMBULATORY_CARE_PROVIDER_SITE_OTHER): Payer: Medicare HMO | Admitting: Family Medicine

## 2019-12-31 ENCOUNTER — Other Ambulatory Visit: Payer: Self-pay

## 2019-12-31 ENCOUNTER — Encounter: Payer: Self-pay | Admitting: Family Medicine

## 2019-12-31 VITALS — BP 118/80 | HR 63 | Temp 97.7°F | Ht <= 58 in | Wt 165.2 lb

## 2019-12-31 DIAGNOSIS — R17 Unspecified jaundice: Secondary | ICD-10-CM

## 2019-12-31 DIAGNOSIS — Z0000 Encounter for general adult medical examination without abnormal findings: Secondary | ICD-10-CM | POA: Diagnosis not present

## 2019-12-31 DIAGNOSIS — I10 Essential (primary) hypertension: Secondary | ICD-10-CM | POA: Diagnosis not present

## 2019-12-31 DIAGNOSIS — R7301 Impaired fasting glucose: Secondary | ICD-10-CM | POA: Diagnosis not present

## 2019-12-31 DIAGNOSIS — E785 Hyperlipidemia, unspecified: Secondary | ICD-10-CM | POA: Diagnosis not present

## 2019-12-31 DIAGNOSIS — R000 Tachycardia, unspecified: Secondary | ICD-10-CM | POA: Diagnosis not present

## 2019-12-31 DIAGNOSIS — H9191 Unspecified hearing loss, right ear: Secondary | ICD-10-CM | POA: Diagnosis not present

## 2019-12-31 DIAGNOSIS — E559 Vitamin D deficiency, unspecified: Secondary | ICD-10-CM

## 2019-12-31 DIAGNOSIS — K219 Gastro-esophageal reflux disease without esophagitis: Secondary | ICD-10-CM | POA: Diagnosis not present

## 2019-12-31 MED ORDER — PANTOPRAZOLE SODIUM 40 MG PO TBEC: 40.0000 mg | DELAYED_RELEASE_TABLET | Freq: Every day | ORAL | 1 refills | Status: DC

## 2019-12-31 NOTE — Progress Notes (Addendum)
Robin Francis DOB: 06-26-1945 Encounter date: 12/31/2019  This is a 74 y.o. female who presents for complete physical   History of present illness/Additional concerns:  Follows with Dr. Delilah Shan for gyn needs: Last visit was 11/12/2019.  She has aged out of Pap smears.  Last mammogram 07/2019 was negative. Last colonoscopy 2018; repeat suggested in 01/2022. Osteopenia: Last DEXA was 11/2018.  Plan for repeat in 2 years per gynecology notes.  Does see dermatology - [facility]  HTN: doesn't check at home - losartan-hctz 100-25   Past Medical History:  Diagnosis Date  . Basal cell carcinoma (BCC) of buttock   . GERD (gastroesophageal reflux disease)   . Hypertension   . OSTEOARTHRITIS 09/17/2008  . Osteopenia 11/2018   T score -1.7 distal third of radius FRAX 8.9% / 1.1%   Past Surgical History:  Procedure Laterality Date  . BACK SURGERY     LOWER  . KNEE ARTHROSCOPY     left  . PARTIAL KNEE ARTHROPLASTY Right 04/19/2015   Procedure: UNICOMPARTMENTAL KNEE MEDIAL REPLACMENT;  Surgeon: Gaynelle Arabian, MD;  Location: [facility];  Service: Orthopedics;  Laterality: Right;  . TONSILLECTOMY     as child  . TOTAL KNEE ARTHROPLASTY  04/03/2011   Procedure: TOTAL KNEE ARTHROPLASTY;  Surgeon: Gearlean Alf, MD;  Location: [facility];  Service: Orthopedics;  Laterality: Left;  . TRIGGER FINGER RELEASE Left    Allergies  Allergen Reactions  . Lisinopril Other (See Comments)    Cough   Current Meds  Medication Sig  . acetaminophen (TYLENOL) 650 MG CR tablet Take 1,300 mg by mouth every morning.  Marland Kitchen amLODipine (NORVASC) 2.5 MG tablet TAKE 1 TABLET BY MOUTH EVERY DAY  . Ascorbic Acid (VITAMIN C) 1000 MG tablet Take 1,000 mg by mouth daily.  . calcium carbonate (CALCIUM 600) 600 MG TABS tablet Take 600 mg by mouth 2 (two) times daily with a meal.  . cetirizine (ZYRTEC) 10 MG tablet Take 10 mg by mouth daily.  . Cholecalciferol (VITAMIN D) 2000 units tablet Take 2,000 Units by mouth daily.  Marland Kitchen  CINNAMON PO Take 1,000 mg by mouth daily.  . Coenzyme Q10 (CO Q 10 PO) Take 120 mg by mouth.  . losartan-hydrochlorothiazide (HYZAAR) 100-25 MG tablet TAKE 1 TABLET BY MOUTH EVERY DAY  . Multiple Vitamin (MULTIVITAMIN) tablet Take 1 tablet by mouth daily.  . Omega 3-6-9 Fatty Acids (OMEGA 3-6-9 COMPLEX PO) Take by mouth.  Marland Kitchen OVER THE COUNTER MEDICATION   . pantoprazole (PROTONIX) 40 MG tablet Take 1 tablet (40 mg total) by mouth daily.  . Red Yeast Rice Extract (RED YEAST RICE PO) Take 240 mg by mouth 2 (two) times daily.  Marland Kitchen thiamine (VITAMIN B-1) 100 MG tablet Take 100 mg by mouth daily.  . vitamin E 400 UNIT capsule Take 400 Units by mouth daily.  . [DISCONTINUED] pantoprazole (PROTONIX) 40 MG tablet TAKE 1 TABLET BY MOUTH EVERY DAY   Social History   Tobacco Use  . Smoking status: Never Smoker  . Smokeless tobacco: Never Used  Substance Use Topics  . Alcohol use: Yes    Alcohol/week: 4.0 standard drinks    Types: 4 Standard drinks or equivalent per week    Comment: occassionally-social   Family History  Problem Relation Age of Onset  . Pancreatic cancer Mother   . Other Father        suicide  . High blood pressure Brother   . Breast cancer Maternal Grandmother  Age 2     Review of Systems  Constitutional: Negative for activity change, appetite change, chills, fatigue, fever and unexpected weight change.  HENT: Negative for congestion, ear pain, hearing loss, sinus pressure, sinus pain, sore throat and trouble swallowing.   Eyes: Negative for pain and visual disturbance.  Respiratory: Negative for cough, chest tightness, shortness of breath and wheezing.   Cardiovascular: Negative for chest pain, palpitations and leg swelling.  Gastrointestinal: Negative for abdominal pain, blood in stool, constipation, diarrhea, nausea and vomiting.  Genitourinary: Negative for difficulty urinating and menstrual problem.  Musculoskeletal: Negative for arthralgias and back pain.   Skin: Negative for rash.  Neurological: Negative for dizziness, weakness, numbness and headaches.  Hematological: Negative for adenopathy. Does not bruise/bleed easily.  Psychiatric/Behavioral: Negative for sleep disturbance and suicidal ideas. The patient is not nervous/anxious.     CBC:  Lab Results  Component Value Date   WBC 8.4 10/10/2018   HGB 13.5 10/10/2018   HCT 39.3 10/10/2018   MCH 29.9 04/20/2015   MCHC 34.4 10/10/2018   RDW 13.0 10/10/2018   PLT 308.0 10/10/2018   CMP: Lab Results  Component Value Date   NA 141 10/10/2018   K 4.3 10/10/2018   CL 100 10/10/2018   CO2 32 10/10/2018   ANIONGAP 11 04/20/2015   GLUCOSE 93 10/10/2018   BUN 16 10/10/2018   CREATININE 0.71 10/10/2018   GFRAA >60 04/20/2015   CALCIUM 9.9 10/10/2018   PROT 6.7 10/10/2018   BILITOT 0.9 10/10/2018   ALKPHOS 74 10/10/2018   ALT 23 10/10/2018   AST 21 10/10/2018   LIPID: Lab Results  Component Value Date   CHOL 236 (H) 10/10/2018   TRIG 94.0 10/10/2018   HDL 61.00 10/10/2018   LDLCALC 156 (H) 10/10/2018    Objective:  BP 118/80 (BP Location: Left Arm, Patient Position: Sitting, Cuff Size: Large)   Pulse 63   Temp 97.7 F (36.5 C) (Oral)   Ht 4\' 9"  (1.448 m)   Wt 165 lb 3.2 oz (74.9 kg)   SpO2 96%   BMI 35.75 kg/m   Weight: 165 lb 3.2 oz (74.9 kg)   BP Readings from Last 3 Encounters:  12/31/19 118/80  11/12/19 124/80  11/11/18 124/80   Wt Readings from Last 3 Encounters:  12/31/19 165 lb 3.2 oz (74.9 kg)  11/12/19 166 lb (75.3 kg)  11/11/18 165 lb (74.8 kg)    Physical Exam Constitutional:      General: She is not in acute distress.    Appearance: She is well-developed.  HENT:     Head: Normocephalic and atraumatic.     Right Ear: External ear normal.     Left Ear: External ear normal.     Mouth/Throat:     Pharynx: No oropharyngeal exudate.  Eyes:     Conjunctiva/sclera: Conjunctivae normal.     Pupils: Pupils are equal, round, and reactive to light.   Neck:     Thyroid: No thyromegaly.  Cardiovascular:     Rate and Rhythm: Normal rate and regular rhythm.     Heart sounds: Murmur heard.  Systolic murmur is present with a grade of 2/6.  No friction rub. No gallop.   Pulmonary:     Effort: Pulmonary effort is normal.     Breath sounds: Normal breath sounds.  Abdominal:     General: Bowel sounds are normal. There is no distension.     Palpations: Abdomen is soft. There is no mass.  Tenderness: There is no abdominal tenderness. There is no guarding.     Hernia: No hernia is present.  Musculoskeletal:        General: No tenderness or deformity. Normal range of motion.     Cervical back: Normal range of motion and neck supple.  Lymphadenopathy:     Cervical: No cervical adenopathy.  Skin:    General: Skin is warm and dry.     Findings: No rash.  Neurological:     Mental Status: She is alert and oriented to person, place, and time.     Deep Tendon Reflexes: Reflexes normal.     Reflex Scores:      Tricep reflexes are 2+ on the right side and 2+ on the left side.      Bicep reflexes are 2+ on the right side and 2+ on the left side.      Brachioradialis reflexes are 2+ on the right side and 2+ on the left side.      Patellar reflexes are 2+ on the right side and 2+ on the left side. Psychiatric:        Speech: Speech normal.        Behavior: Behavior normal.        Thought Content: Thought content normal.     Assessment/Plan: There are no preventive care reminders to display for this patient. Health Maintenance reviewed.  1. Preventative health care We discussed regular exercise/walking. Staying active helps with healthy aging.   2. Essential hypertension, benign Well controlled. Continue with amlodipine 2.5mg  daily, hyzaar 100-25mg  daily.  - CBC with Differential/Platelet; Future - Comprehensive metabolic panel; Future - CBC with Differential/Platelet - Comprehensive metabolic panel  3. Hyperlipidemia, unspecified  hyperlipidemia type Will recheck today. - Lipid panel; Future - TSH; Future - TSH - Lipid panel  4. Impaired fasting glucose - Hemoglobin A1c; Future - Hemoglobin A1c  5. Vitamin D deficiency - VITAMIN D 25 Hydroxy (Vit-D Deficiency, Fractures); Future - VITAMIN D 25 Hydroxy (Vit-D Deficiency, Fractures)  6. Tachycardia HR on exam was normal. EKG obtained due to pulse ox reading rate elevated. Patient has no palpitations or sx associated with tachycardia. EKG actually shows bradycardia, but normal sinus and without acute changes. No further eval needed. Suspect error reading from pulse ox. - EKG 12-Lead  7. Gastroesophageal reflux disease, unspecified whether esophagitis present Stable on current treatment.  - pantoprazole (PROTONIX) 40 MG tablet; Take 1 tablet (40 mg total) by mouth daily.  Dispense: 90 tablet; Refill: 1  7. Hearing loss: referral to audiology for further evaluation.   Return in about 6 months (around 06/29/2020) for Chronic condition visit.  Micheline Rough, MD

## 2019-12-31 NOTE — Addendum Note (Signed)
Addended by: Caren Macadam on: 12/31/2019 01:14 PM   Modules accepted: Orders

## 2020-01-01 LAB — CBC WITH DIFFERENTIAL/PLATELET
Absolute Monocytes: 570 cells/uL (ref 200–950)
Basophils Absolute: 68 cells/uL (ref 0–200)
Basophils Relative: 0.9 %
Eosinophils Absolute: 90 cells/uL (ref 15–500)
Eosinophils Relative: 1.2 %
HCT: 41.5 % (ref 35.0–45.0)
Hemoglobin: 14.2 g/dL (ref 11.7–15.5)
Lymphs Abs: 2310 cells/uL (ref 850–3900)
MCH: 31 pg (ref 27.0–33.0)
MCHC: 34.2 g/dL (ref 32.0–36.0)
MCV: 90.6 fL (ref 80.0–100.0)
MPV: 10.7 fL (ref 7.5–12.5)
Monocytes Relative: 7.6 %
Neutro Abs: 4463 cells/uL (ref 1500–7800)
Neutrophils Relative %: 59.5 %
Platelets: 328 10*3/uL (ref 140–400)
RBC: 4.58 10*6/uL (ref 3.80–5.10)
RDW: 12.3 % (ref 11.0–15.0)
Total Lymphocyte: 30.8 %
WBC: 7.5 10*3/uL (ref 3.8–10.8)

## 2020-01-01 LAB — TSH: TSH: 1.28 mIU/L (ref 0.40–4.50)

## 2020-01-01 LAB — COMPREHENSIVE METABOLIC PANEL
AG Ratio: 2 (calc) (ref 1.0–2.5)
ALT: 26 U/L (ref 6–29)
AST: 26 U/L (ref 10–35)
Albumin: 4.7 g/dL (ref 3.6–5.1)
Alkaline phosphatase (APISO): 70 U/L (ref 37–153)
BUN: 13 mg/dL (ref 7–25)
CO2: 30 mmol/L (ref 20–32)
Calcium: 10.4 mg/dL (ref 8.6–10.4)
Chloride: 100 mmol/L (ref 98–110)
Creat: 0.73 mg/dL (ref 0.60–0.93)
Globulin: 2.4 g/dL (calc) (ref 1.9–3.7)
Glucose, Bld: 99 mg/dL (ref 65–99)
Potassium: 4.1 mmol/L (ref 3.5–5.3)
Sodium: 142 mmol/L (ref 135–146)
Total Bilirubin: 1.3 mg/dL — ABNORMAL HIGH (ref 0.2–1.2)
Total Protein: 7.1 g/dL (ref 6.1–8.1)

## 2020-01-01 LAB — LIPID PANEL
Cholesterol: 255 mg/dL — ABNORMAL HIGH (ref <200)
HDL: 67 mg/dL (ref >=50)
LDL Cholesterol (Calc): 165 mg/dL (calc) — ABNORMAL HIGH
Non-HDL Cholesterol (Calc): 188 mg/dL (calc) — ABNORMAL HIGH (ref <130)
Total CHOL/HDL Ratio: 3.8 (calc) (ref <5.0)
Triglycerides: 115 mg/dL (ref <150)

## 2020-01-01 LAB — HEMOGLOBIN A1C
Hgb A1c MFr Bld: 5.4 % of total Hgb (ref <5.7)
Mean Plasma Glucose: 108 (calc)
eAG (mmol/L): 6 (calc)

## 2020-01-01 LAB — VITAMIN D 25 HYDROXY (VIT D DEFICIENCY, FRACTURES): Vit D, 25-Hydroxy: 49 ng/mL (ref 30–100)

## 2020-01-02 ENCOUNTER — Other Ambulatory Visit: Payer: Self-pay | Admitting: Family Medicine

## 2020-01-02 DIAGNOSIS — I10 Essential (primary) hypertension: Secondary | ICD-10-CM

## 2020-01-07 NOTE — Addendum Note (Signed)
Addended by: Agnes Lawrence on: 01/07/2020 08:34 AM   Modules accepted: Orders

## 2020-01-08 NOTE — Addendum Note (Signed)
Addended by: Marrion Coy on: 01/08/2020 02:50 PM   Modules accepted: Orders

## 2020-01-17 ENCOUNTER — Other Ambulatory Visit: Payer: Self-pay | Admitting: Family Medicine

## 2020-01-17 DIAGNOSIS — I10 Essential (primary) hypertension: Secondary | ICD-10-CM

## 2020-01-19 NOTE — Telephone Encounter (Signed)
Last OV 12/31/19 Last refill 10/02/19 #90/0 Next OV not scheduled

## 2020-02-10 ENCOUNTER — Other Ambulatory Visit (INDEPENDENT_AMBULATORY_CARE_PROVIDER_SITE_OTHER): Payer: Medicare HMO

## 2020-02-10 ENCOUNTER — Other Ambulatory Visit: Payer: Self-pay

## 2020-02-10 DIAGNOSIS — R17 Unspecified jaundice: Secondary | ICD-10-CM | POA: Diagnosis not present

## 2020-02-10 LAB — HEPATIC FUNCTION PANEL
ALT: 22 U/L (ref 0–35)
AST: 22 U/L (ref 0–37)
Albumin: 4.4 g/dL (ref 3.5–5.2)
Alkaline Phosphatase: 74 U/L (ref 39–117)
Bilirubin, Direct: 0.1 mg/dL (ref 0.0–0.3)
Total Bilirubin: 0.9 mg/dL (ref 0.2–1.2)
Total Protein: 6.5 g/dL (ref 6.0–8.3)

## 2020-03-01 ENCOUNTER — Other Ambulatory Visit: Payer: Medicare HMO

## 2020-04-09 NOTE — Progress Notes (Signed)
Subjective:   Robin Francis is a 75 y.o. female who presents for Medicare Annual (Subsequent) preventive examination.  I connected with Nhu Glasby today by telephone and verified that I am speaking with the correct person using two identifiers. Location patient: home Location provider: work Persons participating in the virtual visit: patient, provider.   I discussed the limitations, risks, security and privacy concerns of performing an evaluation and management service by telephone and the availability of in person appointments. I also discussed with the patient that there may be a patient responsible charge related to this service. The patient expressed understanding and verbally consented to this telephonic visit.    Interactive audio and video telecommunications were attempted between this provider and patient, however failed, due to patient having technical difficulties OR patient did not have access to video capability.  We continued and completed visit with audio only.      Review of Systems    N/A  Cardiac Risk Factors include: advanced age (>58men, >31 women);hypertension     Objective:    Today's Vitals   There is no height or weight on file to calculate BMI.  Advanced Directives 04/13/2020 01/09/2017 04/19/2015 04/13/2015 04/03/2011 03/21/2011  Does Patient Have a Medical Advance Directive? Yes Yes Yes Yes Patient has advance directive, copy in chart Patient has advance directive, copy not in chart  Type of Advance Directive [place];Living will [place];Living will [place];Living will [place];Living will - [place];Living will  Does patient want to make changes to medical advance directive? No - Patient declined - No - Patient declined No - Patient declined - -  Copy of [place] in Chart? No - copy requested No - copy requested Yes Yes - Copy requested  from family  Pre-existing out of facility DNR order (yellow form or pink MOST form) - - - - - No    Current Medications (verified) Outpatient Encounter Medications as of 04/13/2020  Medication Sig  . amLODipine (NORVASC) 2.5 MG tablet TAKE 1 TABLET BY MOUTH EVERY DAY  . Ascorbic Acid (VITAMIN C) 1000 MG tablet Take 1,000 mg by mouth daily.  . calcium carbonate (OS-CAL) 600 MG TABS tablet Take 600 mg by mouth 2 (two) times daily with a meal.  . cetirizine (ZYRTEC) 10 MG tablet Take 10 mg by mouth daily.  . Cholecalciferol (VITAMIN D) 2000 units tablet Take 2,000 Units by mouth daily.  Marland Kitchen CINNAMON PO Take 1,000 mg by mouth daily.  . Coenzyme Q10 (CO Q 10 PO) Take 120 mg by mouth.  . losartan-hydrochlorothiazide (HYZAAR) 100-25 MG tablet TAKE 1 TABLET BY MOUTH EVERY DAY  . Multiple Vitamin (MULTIVITAMIN) tablet Take 1 tablet by mouth daily.  . Omega 3-6-9 Fatty Acids (OMEGA 3-6-9 COMPLEX PO) Take by mouth.  Marland Kitchen OVER THE COUNTER MEDICATION   . pantoprazole (PROTONIX) 40 MG tablet Take 1 tablet (40 mg total) by mouth daily.  . Red Yeast Rice Extract (RED YEAST RICE PO) Take 240 mg by mouth 2 (two) times daily.  Marland Kitchen thiamine (VITAMIN B-1) 100 MG tablet Take 100 mg by mouth daily.  . vitamin E 400 UNIT capsule Take 400 Units by mouth daily.  . [DISCONTINUED] acetaminophen (TYLENOL) 650 MG CR tablet Take 1,300 mg by mouth every morning.   No facility-administered encounter medications on file as of 04/13/2020.    Allergies (verified) Lisinopril   History: Past Medical History:  Diagnosis Date  .  Basal cell carcinoma (BCC) of buttock   . GERD (gastroesophageal reflux disease)   . Hypertension   . OSTEOARTHRITIS 09/17/2008  . Osteopenia 11/2018   T score -1.7 distal third of radius FRAX 8.9% / 1.1%   Past Surgical History:  Procedure Laterality Date  . BACK SURGERY     LOWER  . KNEE ARTHROSCOPY     left  . PARTIAL KNEE ARTHROPLASTY Right 04/19/2015   Procedure: UNICOMPARTMENTAL KNEE MEDIAL  REPLACMENT;  Surgeon: Gaynelle Arabian, MD;  Location: [facility];  Service: Orthopedics;  Laterality: Right;  . TONSILLECTOMY     as child  . TOTAL KNEE ARTHROPLASTY  04/03/2011   Procedure: TOTAL KNEE ARTHROPLASTY;  Surgeon: Gearlean Alf, MD;  Location: [facility];  Service: Orthopedics;  Laterality: Left;  . TRIGGER FINGER RELEASE Left    Family History  Problem Relation Age of Onset  . Pancreatic cancer Mother   . Other Father        suicide  . High blood pressure Brother   . Breast cancer Maternal Grandmother        Age 109   Social History   Socioeconomic History  . Marital status: Married    Spouse name: Not on file  . Number of children: Not on file  . Years of education: Not on file  . Highest education level: Not on file  Occupational History  . Not on file  Tobacco Use  . Smoking status: Never Smoker  . Smokeless tobacco: Never Used  Vaping Use  . Vaping Use: Never used  Substance and Sexual Activity  . Alcohol use: Yes    Alcohol/week: 4.0 standard drinks    Types: 4 Standard drinks or equivalent per week    Comment: occassionally-social  . Drug use: No  . Sexual activity: Not Currently    Birth control/protection: Post-menopausal    Comment: 1st intercourse 75 yo-Fewer than 5 partners  Other Topics Concern  . Not on file  Social History Narrative  . Not on file   Social Determinants of Health   Financial Resource Strain: Low Risk   . Difficulty of Paying Living Expenses: Not hard at all  Food Insecurity: No Food Insecurity  . Worried About Charity fundraiser in the Last Year: Never true  . Ran Out of Food in the Last Year: Never true  Transportation Needs: No Transportation Needs  . Lack of Transportation (Medical): No  . Lack of Transportation (Non-Medical): No  Physical Activity: Sufficiently Active  . Days of Exercise per Week: 5 days  . Minutes of Exercise per Session: 40 min  Stress: No Stress Concern Present  . Feeling of Stress : Not at all   Social Connections: Moderately Isolated  . Frequency of Communication with Friends and Family: More than three times a week  . Frequency of Social Gatherings with Friends and Family: Twice a week  . Attends Religious Services: Never  . Active Member of Clubs or Organizations: No  . Attends Archivist Meetings: Never  . Marital Status: Married    Tobacco Counseling Counseling given: Not Answered   Clinical Intake:  Pre-visit preparation completed: Yes  Pain : No/denies pain     Nutritional Risks: None Diabetes: No  How often do you need to have someone help you when you read instructions, pamphlets, or other written materials from your doctor or pharmacy?: 1 - Never  Diabetic? No   Interpreter Needed?: No  Information entered by :: SCrews,LPN  Activities of Daily Living In your present state of health, do you have any difficulty performing the following activities: 04/13/2020  Hearing? Y  Comment has slight hearing loss  Vision? N  Difficulty concentrating or making decisions? N  Walking or climbing stairs? N  Dressing or bathing? N  Doing errands, shopping? N  Preparing Food and eating ? N  Using the Toilet? N  In the past six months, have you accidently leaked urine? N  Do you have problems with loss of bowel control? N  Managing your Medications? N  Managing your Finances? N  Housekeeping or managing your Housekeeping? N  Some recent data might be hidden    Patient Care Team: Caren Macadam, MD as PCP - General (Family Medicine) Dermatology, Presley Raddle, Leory Plowman, MD as Consulting Physician (Ophthalmology)  Indicate any recent Medical Services you may have received from other than [facility] providers in the past year (date may be approximate).     Assessment:   This is a routine wellness examination for [place].  Hearing/Vision screen  Hearing Screening   125Hz  250Hz  500Hz  1000Hz  2000Hz  3000Hz  4000Hz  6000Hz  8000Hz   Right ear:            Left ear:           Vision Screening Comments: States gets eyes examined once year   Dietary issues and exercise activities discussed: Current Exercise Habits: Home exercise routine, Type of exercise: walking, Time (Minutes): 45, Frequency (Times/Week): 5, Weekly Exercise (Minutes/Week): 225, Intensity: Moderate  Goals    . Weight (lb) < 140 lb (63.5 kg)      Depression Screen PHQ 2/9 Scores 04/13/2020 12/31/2019 10/16/2018 12/20/2015 11/17/2014 10/06/2013 10/03/2012  PHQ - 2 Score 0 0 0 0 0 0 0    Fall Risk Fall Risk  04/13/2020 12/31/2019 10/16/2018 12/20/2015 11/17/2014  Falls in the past year? 0 0 0 No Yes  Number falls in past yr: 0 0 0 - 1  Injury with Fall? 0 - 0 - No  Risk for fall due to : No Fall Risks - - - Impaired balance/gait  Follow up Falls evaluation completed;Falls prevention discussed - - - -    FALL RISK PREVENTION PERTAINING TO THE HOME:  Any stairs in or around the home? No  If so, are there any without handrails? No  Home free of loose throw rugs in walkways, pet beds, electrical cords, etc? Yes  Adequate lighting in your home to reduce risk of falls? Yes   ASSISTIVE DEVICES UTILIZED TO PREVENT FALLS:  Life alert? No  Use of a cane, walker or w/c? No  Grab bars in the bathroom? No  Shower chair or bench in shower? No  Elevated toilet seat or a handicapped toilet? No     Cognitive Function: Normal cognitive status assessed by direct observation by this Nurse Health Advisor. No abnormalities found.         Immunizations Immunization History  Administered Date(s) Administered  . Influenza, High Dose Seasonal PF 12/05/2013, 11/17/2014, 12/20/2015, 12/21/2016, 12/06/2017  . Influenza,inj,Quad PF,6+ Mos 11/11/2018  . Influenza-Unspecified 11/11/2019  . PFIZER(Purple Top)SARS-COV-2 Vaccination 03/06/2019, 04/03/2019, 11/11/2019  . Pneumococcal Conjugate-13 10/06/2013  . Pneumococcal Polysaccharide-23 10/03/2012  . Tdap 09/30/2010  . Zoster Recombinat  (Shingrix) 07/16/2017, 10/24/2017    TDAP status: Up to date  Flu Vaccine status: Up to date  Pneumococcal vaccine status: Up to date  Covid-19 vaccine status: Completed vaccines  Qualifies for Shingles Vaccine? Yes   Zostavax completed No  Shingrix Completed?: Yes  Screening Tests Health Maintenance  Topic Date Due  . MAMMOGRAM  07/23/2020  . TETANUS/TDAP  09/29/2020  . COLONOSCOPY (Pts 45-41yrs Insurance coverage will need to be confirmed)  01/09/2022  . INFLUENZA VACCINE  Completed  . DEXA SCAN  Completed  . COVID-19 Vaccine  Completed  . Hepatitis C Screening  Completed  . PNA vac Low Risk Adult  Completed  . HPV VACCINES  Aged Out    Health Maintenance  There are no preventive care reminders to display for this patient.  Colorectal cancer screening: Type of screening: Colonoscopy. Completed 01/09/2017. Repeat every 5 years  Mammogram status: Completed 07/24/2019. Repeat every year  Bone Density status: Completed 12/03/2018. Results reflect: Bone density results: OSTEOPENIA. Repeat every 5 years.  Lung Cancer Screening: (Low Dose CT Chest recommended if Age 42-80 years, 30 pack-year currently smoking OR have quit w/in 15years.) does not qualify.   Lung Cancer Screening Referral: N/A   Additional Screening:  Hepatitis C Screening: does qualify; Completed 12/21/2016  Vision Screening: Recommended annual ophthalmology exams for early detection of glaucoma and other disorders of the eye. Is the patient up to date with their annual eye exam?  Yes  Who is the provider or what is the name of the office in which the patient attends annual eye exams? Dr. Valetta Close If pt is not established with a provider, would they like to be referred to a provider to establish care? No .   Dental Screening: Recommended annual dental exams for proper oral hygiene  Community Resource Referral / Chronic Care Management: CRR required this visit?  No   CCM required this visit?  No       Plan:     I have personally reviewed and noted the following in the patient's chart:   . Medical and social history . Use of alcohol, tobacco or illicit drugs  . Current medications and supplements . Functional ability and status . Nutritional status . Physical activity . Advanced directives . List of other physicians . Hospitalizations, surgeries, and ER visits in previous 12 months . Vitals . Screenings to include cognitive, depression, and falls . Referrals and appointments  In addition, I have reviewed and discussed with patient certain preventive protocols, quality metrics, and best practice recommendations. A written personalized care plan for preventive services as well as general preventive health recommendations were provided to patient.     Ofilia Neas, LPN   06/06/6999   Nurse Notes: None

## 2020-04-13 ENCOUNTER — Ambulatory Visit (INDEPENDENT_AMBULATORY_CARE_PROVIDER_SITE_OTHER): Payer: Medicare HMO

## 2020-04-13 ENCOUNTER — Other Ambulatory Visit: Payer: Self-pay

## 2020-04-13 DIAGNOSIS — Z0000 Encounter for general adult medical examination without abnormal findings: Secondary | ICD-10-CM

## 2020-04-13 NOTE — Patient Instructions (Signed)
Ms. Robin Francis , Thank you for taking time to come for your Medicare Wellness Visit. I appreciate your ongoing commitment to your health goals. Please review the following plan we discussed and let me know if I can assist you in the future.   Screening recommendations/referrals: Colonoscopy: Up to date, next due 01/09/2022 Mammogram: Up to date, next due 07/23/2020 Bone Density: Up to date, next due 12/03/2023 Recommended yearly ophthalmology/optometry visit for glaucoma screening and checkup Recommended yearly dental visit for hygiene and checkup  Vaccinations: Influenza vaccine: Up to date, next due fall 2022  Pneumococcal vaccine: Completed series  Tdap vaccine: Up to date, next due 08/ 24/2022 Shingles vaccine: Completed series     Advanced directives: Please bring a copy of your advanced medical directives so that I may scan them into your chart,   Conditions/risks identified: none   Next appointment: None   Preventive Care 65 Years and Older, Female Preventive care refers to lifestyle choices and visits with your health care provider that can promote health and wellness. What does preventive care include?  A yearly physical exam. This is also called an annual well check.  Dental exams once or twice a year.  Routine eye exams. Ask your health care provider how often you should have your eyes checked.  Personal lifestyle choices, including:  Daily care of your teeth and gums.  Regular physical activity.  Eating a healthy diet.  Avoiding tobacco and drug use.  Limiting alcohol use.  Practicing safe sex.  Taking low-dose aspirin every day.  Taking vitamin and mineral supplements as recommended by your health care provider. What happens during an annual well check? The services and screenings done by your health care provider during your annual well check will depend on your age, overall health, lifestyle risk factors, and family history of disease. Counseling  Your  health care provider may ask you questions about your:  Alcohol use.  Tobacco use.  Drug use.  Emotional well-being.  Home and relationship well-being.  Sexual activity.  Eating habits.  History of falls.  Memory and ability to understand (cognition).  Work and work Statistician.  Reproductive health. Screening  You may have the following tests or measurements:  Height, weight, and BMI.  Blood pressure.  Lipid and cholesterol levels. These may be checked every 5 years, or more frequently if you are over 53 years old.  Skin check.  Lung cancer screening. You may have this screening every year starting at age 58 if you have a 30-pack-year history of smoking and currently smoke or have quit within the past 15 years.  Fecal occult blood test (FOBT) of the stool. You may have this test every year starting at age 86.  Flexible sigmoidoscopy or colonoscopy. You may have a sigmoidoscopy every 5 years or a colonoscopy every 10 years starting at age 16.  Hepatitis C blood test.  Hepatitis B blood test.  Sexually transmitted disease (STD) testing.  Diabetes screening. This is done by checking your blood sugar (glucose) after you have not eaten for a while (fasting). You may have this done every 1-3 years.  Bone density scan. This is done to screen for osteoporosis. You may have this done starting at age 8.  Mammogram. This may be done every 1-2 years. Talk to your health care provider about how often you should have regular mammograms. Talk with your health care provider about your test results, treatment options, and if necessary, the need for more tests. Vaccines  Your health  care provider may recommend certain vaccines, such as:  Influenza vaccine. This is recommended every year.  Tetanus, diphtheria, and acellular pertussis (Tdap, Td) vaccine. You may need a Td booster every 10 years.  Zoster vaccine. You may need this after age 23.  Pneumococcal 13-valent  conjugate (PCV13) vaccine. One dose is recommended after age 79.  Pneumococcal polysaccharide (PPSV23) vaccine. One dose is recommended after age 70. Talk to your health care provider about which screenings and vaccines you need and how often you need them. This information is not intended to replace advice given to you by your health care provider. Make sure you discuss any questions you have with your health care provider. Document Released: 02/19/2015 Document Revised: 10/13/2015 Document Reviewed: 11/24/2014 Elsevier Interactive Patient Education  2017 [place] Prevention in the Home Falls can cause injuries. They can happen to people of all ages. There are many things you can do to make your home safe and to help prevent falls. What can I do on the outside of my home?  Regularly fix the edges of walkways and driveways and fix any cracks.  Remove anything that might make you trip as you walk through a door, such as a raised step or threshold.  Trim any bushes or trees on the path to your home.  Use bright outdoor lighting.  Clear any walking paths of anything that might make someone trip, such as rocks or tools.  Regularly check to see if handrails are loose or broken. Make sure that both sides of any steps have handrails.  Any raised decks and porches should have guardrails on the edges.  Have any leaves, snow, or ice cleared regularly.  Use sand or salt on walking paths during winter.  Clean up any spills in your garage right away. This includes oil or grease spills. What can I do in the bathroom?  Use night lights.  Install grab bars by the toilet and in the tub and shower. Do not use towel bars as grab bars.  Use non-skid mats or decals in the tub or shower.  If you need to sit down in the shower, use a plastic, non-slip stool.  Keep the floor dry. Clean up any water that spills on the floor as soon as it happens.  Remove soap buildup in the tub or  shower regularly.  Attach bath mats securely with double-sided non-slip rug tape.  Do not have throw rugs and other things on the floor that can make you trip. What can I do in the bedroom?  Use night lights.  Make sure that you have a light by your bed that is easy to reach.  Do not use any sheets or blankets that are too big for your bed. They should not hang down onto the floor.  Have a firm chair that has side arms. You can use this for support while you get dressed.  Do not have throw rugs and other things on the floor that can make you trip. What can I do in the kitchen?  Clean up any spills right away.  Avoid walking on wet floors.  Keep items that you use a lot in easy-to-reach places.  If you need to reach something above you, use a strong step stool that has a grab bar.  Keep electrical cords out of the way.  Do not use floor polish or wax that makes floors slippery. If you must use wax, use non-skid floor wax.  Do not have  throw rugs and other things on the floor that can make you trip. What can I do with my stairs?  Do not leave any items on the stairs.  Make sure that there are handrails on both sides of the stairs and use them. Fix handrails that are broken or loose. Make sure that handrails are as long as the stairways.  Check any carpeting to make sure that it is firmly attached to the stairs. Fix any carpet that is loose or worn.  Avoid having throw rugs at the top or bottom of the stairs. If you do have throw rugs, attach them to the floor with carpet tape.  Make sure that you have a light switch at the top of the stairs and the bottom of the stairs. If you do not have them, ask someone to add them for you. What else can I do to help prevent falls?  Wear shoes that:  Do not have high heels.  Have rubber bottoms.  Are comfortable and fit you well.  Are closed at the toe. Do not wear sandals.  If you use a stepladder:  Make sure that it is fully  opened. Do not climb a closed stepladder.  Make sure that both sides of the stepladder are locked into place.  Ask someone to hold it for you, if possible.  Clearly mark and make sure that you can see:  Any grab bars or handrails.  First and last steps.  Where the edge of each step is.  Use tools that help you move around (mobility aids) if they are needed. These include:  Canes.  Walkers.  Scooters.  Crutches.  Turn on the lights when you go into a dark area. Replace any light bulbs as soon as they burn out.  Set up your furniture so you have a clear path. Avoid moving your furniture around.  If any of your floors are uneven, fix them.  If there are any pets around you, be aware of where they are.  Review your medicines with your doctor. Some medicines can make you feel dizzy. This can increase your chance of falling. Ask your doctor what other things that you can do to help prevent falls. This information is not intended to replace advice given to you by your health care provider. Make sure you discuss any questions you have with your health care provider. Document Released: 11/19/2008 Document Revised: 07/01/2015 Document Reviewed: 02/27/2014 Elsevier Interactive Patient Education  2017 [employer].

## 2020-05-03 DIAGNOSIS — G5761 Lesion of plantar nerve, right lower limb: Secondary | ICD-10-CM | POA: Diagnosis not present

## 2020-05-10 DIAGNOSIS — G5761 Lesion of plantar nerve, right lower limb: Secondary | ICD-10-CM | POA: Diagnosis not present

## 2020-05-10 DIAGNOSIS — M7751 Other enthesopathy of right foot: Secondary | ICD-10-CM | POA: Diagnosis not present

## 2020-05-17 DIAGNOSIS — M7751 Other enthesopathy of right foot: Secondary | ICD-10-CM | POA: Diagnosis not present

## 2020-05-17 DIAGNOSIS — G5761 Lesion of plantar nerve, right lower limb: Secondary | ICD-10-CM | POA: Diagnosis not present

## 2020-06-08 DIAGNOSIS — Z20822 Contact with and (suspected) exposure to covid-19: Secondary | ICD-10-CM | POA: Diagnosis not present

## 2020-07-05 ENCOUNTER — Encounter: Payer: Self-pay | Admitting: Family Medicine

## 2020-07-06 ENCOUNTER — Telehealth (INDEPENDENT_AMBULATORY_CARE_PROVIDER_SITE_OTHER): Payer: Medicare HMO | Admitting: Family Medicine

## 2020-07-06 DIAGNOSIS — R059 Cough, unspecified: Secondary | ICD-10-CM

## 2020-07-06 MED ORDER — BENZONATATE 200 MG PO CAPS
200.0000 mg | ORAL_CAPSULE | Freq: Three times a day (TID) | ORAL | 0 refills | Status: DC | PRN
Start: 2020-07-06 — End: 2020-11-23

## 2020-07-06 NOTE — Patient Instructions (Signed)
INSTRUCTIONS FOR UPPER RESPIRATORY INFECTION:  -plenty of rest and fluids  -nasal saline wash 2-3 times daily (use prepackaged nasal saline or bottled/distilled water if making your own)   -can use AFRIN nasal spray for drainage and nasal congestion - but do NOT use longer then 3-4 days  -can use tylenol (in no history of liver disease) or ibuprofen (if no history of kidney disease, bowel bleeding or significant heart disease) as directed for aches and sorethroat  -in the winter time, using a humidifier at night is helpful (please follow cleaning instructions)  -if you are taking a cough medication - use only as directed, may also try a teaspoon of honey to coat the throat and throat lozenges. -for sore throat, salt water gargles can help  -I sent the medication(s) we discussed to your pharmacy: Meds ordered this encounter  Medications  . benzonatate (TESSALON) 200 MG capsule    Sig: Take 1 capsule (200 mg total) by mouth 3 (three) times daily as needed.    Dispense:  20 capsule    Refill:  0     I hope you are feeling better soon!  Seek in person care promptly if your symptoms worsen, new concerns arise or you are not improving with treatment.  It was nice to meet you today. I help [facility] out with telemedicine visits on Tuesdays and Thursdays and am available for visits on those days. If you have any concerns or questions following this visit please schedule a follow up visit with your Primary Care doctor or seek care at a local urgent care clinic to avoid delays in care.    Upper Respiratory Infection, Adult An upper respiratory infection (URI) is also known as the common cold. It is often caused by a type of germ (virus). Colds are easily spread (contagious). You can pass it to others by kissing, coughing, sneezing, or drinking out of the same glass. Usually, you get better in 1 to 3  weeks.  However, the cough can last for even longer. HOME CARE   Only take medicine as  told by your doctor. Follow instructions provided above.  Drink enough water and fluids to keep your pee (urine) clear or pale yellow.  Get plenty of rest.  Return to work when your temperature is < 100 for 24 hours or as told by your doctor. You may use a face mask and wash your hands to stop your cold from spreading. GET HELP RIGHT AWAY IF:   After the first few days, you feel you are getting worse.  You have questions about your medicine.  You have chills, shortness of breath, or red spit (mucus).  You have pain in the face for more then 1-2 days, especially when you bend forward.  You have a fever, puffy (swollen) neck, pain when you swallow, or white spots in the back of your throat.  You have a bad headache, ear pain, sinus pain, or chest pain.  You have a high-pitched whistling sound when you breathe in and out (wheezing).  You cough up blood.  You have sore muscles or a stiff neck. MAKE SURE YOU:   Understand these instructions.  Will watch your condition.  Will get help right away if you are not doing well or get worse. Document Released: 07/12/2007 Document Revised: 04/17/2011 Document Reviewed: 04/30/2013 [facility] Patient Information 2015 [place], [place]. This information is not intended to replace advice given to you by your health care provider. Make sure you discuss any questions  you have with your health care provider.

## 2020-07-06 NOTE — Progress Notes (Signed)
Virtual Visit via Video Note  I connected with [name]  on 07/06/20 at 12:20 PM EDT by a video enabled telemedicine application and verified that I am speaking with the correct person using two identifiers.  Location patient: home, [place] Location provider:work or home office Persons participating in the virtual visit: patient, provider  I discussed the limitations of evaluation and management by telemedicine and the availability of in person appointments. The patient expressed understanding and agreed to proceed.   HPI:  Acute telemedicine visit for cough: -Onset: 1 week - has done covid tests which have been negative -Symptoms include: laryngitis, cough, congestion -Denies: CP, SOB, NVD, inability to eat/drink/get out of bed -Has tried: OTC options -Pertinent past medical history:  See below -Pertinent medication allergies: lisinpril -COVID-19 vaccine status:  Has had two doses plus boosters -she is requesting codeine for the cough  ROS: See pertinent positives and negatives per HPI.  Past Medical History:  Diagnosis Date  . Basal cell carcinoma (BCC) of buttock   . GERD (gastroesophageal reflux disease)   . Hypertension   . OSTEOARTHRITIS 09/17/2008  . Osteopenia 11/2018   T score -1.7 distal third of radius FRAX 8.9% / 1.1%    Past Surgical History:  Procedure Laterality Date  . BACK SURGERY     LOWER  . KNEE ARTHROSCOPY     left  . PARTIAL KNEE ARTHROPLASTY Right 04/19/2015   Procedure: UNICOMPARTMENTAL KNEE MEDIAL REPLACMENT;  Surgeon: Gaynelle Arabian, MD;  Location: [facility];  Service: Orthopedics;  Laterality: Right;  . TONSILLECTOMY     as child  . TOTAL KNEE ARTHROPLASTY  04/03/2011   Procedure: TOTAL KNEE ARTHROPLASTY;  Surgeon: Gearlean Alf, MD;  Location: [facility];  Service: Orthopedics;  Laterality: Left;  . TRIGGER FINGER RELEASE Left      Current Outpatient Medications:  .  benzonatate (TESSALON) 200 MG capsule, Take 1 capsule (200 mg total) by mouth 3 (three)  times daily as needed., Disp: 20 capsule, Rfl: 0 .  amLODipine (NORVASC) 2.5 MG tablet, TAKE 1 TABLET BY MOUTH EVERY DAY, Disp: 30 tablet, Rfl: 5 .  Ascorbic Acid (VITAMIN C) 1000 MG tablet, Take 1,000 mg by mouth daily., Disp: , Rfl:  .  calcium carbonate (OS-CAL) 600 MG TABS tablet, Take 600 mg by mouth 2 (two) times daily with a meal., Disp: , Rfl:  .  cetirizine (ZYRTEC) 10 MG tablet, Take 10 mg by mouth daily., Disp: , Rfl:  .  Cholecalciferol (VITAMIN D) 2000 units tablet, Take 2,000 Units by mouth daily., Disp: , Rfl:  .  CINNAMON PO, Take 1,000 mg by mouth daily., Disp: , Rfl:  .  Coenzyme Q10 (CO Q 10 PO), Take 120 mg by mouth., Disp: , Rfl:  .  losartan-hydrochlorothiazide (HYZAAR) 100-25 MG tablet, TAKE 1 TABLET BY MOUTH EVERY DAY, Disp: 90 tablet, Rfl: 1 .  Multiple Vitamin (MULTIVITAMIN) tablet, Take 1 tablet by mouth daily., Disp: , Rfl:  .  Omega 3-6-9 Fatty Acids (OMEGA 3-6-9 COMPLEX PO), Take by mouth., Disp: , Rfl:  .  OVER THE COUNTER MEDICATION, , Disp: , Rfl:  .  pantoprazole (PROTONIX) 40 MG tablet, Take 1 tablet (40 mg total) by mouth daily., Disp: 90 tablet, Rfl: 1 .  Red Yeast Rice Extract (RED YEAST RICE PO), Take 240 mg by mouth 2 (two) times daily., Disp: , Rfl:  .  thiamine (VITAMIN B-1) 100 MG tablet, Take 100 mg by mouth daily., Disp: , Rfl:  .  vitamin E  400 UNIT capsule, Take 400 Units by mouth daily., Disp: , Rfl:   EXAM:  VITALS per patient if applicable:  GENERAL: alert, oriented, appears well and in no acute distress  HEENT: atraumatic, conjunttiva clear, no obvious abnormalities on inspection of external nose and ears  NECK: normal movements of the head and neck  LUNGS: on inspection no signs of respiratory distress, breathing rate appears normal, no obvious gross SOB, gasping or wheezing  CV: no obvious cyanosis  MS: moves all visible extremities without noticeable abnormality  PSYCH/NEURO: pleasant and cooperative, no obvious depression or  anxiety, speech and thought processing grossly intact  ASSESSMENT AND PLAN:  Discussed the following assessment and plan:  Cough  -we discussed possible serious and likely etiologies, options for evaluation and workup, limitations of telemedicine visit vs in person visit, treatment, treatment risks and precautions. Pt prefers to treat via telemedicine empirically rather than in person at this moment.  Query viral upper respiratory illness versus other.  She did have negative COVID testing per her report.  She is requesting codeine for the cough.  Did let her know per telemedicine guidelines, we are not to prescribe narcotic containing medications, including cough syrups via virtual visits.  We did talk about alternatives.  She opted to try Tessalon and did want the higher dose of this medication.  Also discussed other symptomatic care measures per patient instructions. Work/School slipped offered:  declined Scheduled follow up with PCP offered: She plans to follow-up in person with her primary care office if symptoms worsen or persist.  I discussed the assessment and treatment plan with the patient. The patient was provided an opportunity to ask questions and all were answered. The patient agreed with the plan and demonstrated an understanding of the instructions.     Lucretia Kern, DO

## 2020-07-13 ENCOUNTER — Other Ambulatory Visit: Payer: Self-pay | Admitting: Family Medicine

## 2020-07-13 DIAGNOSIS — I10 Essential (primary) hypertension: Secondary | ICD-10-CM

## 2020-07-15 ENCOUNTER — Encounter: Payer: Self-pay | Admitting: Family Medicine

## 2020-07-20 ENCOUNTER — Encounter: Payer: Self-pay | Admitting: Family Medicine

## 2020-07-20 ENCOUNTER — Other Ambulatory Visit: Payer: Self-pay

## 2020-07-21 ENCOUNTER — Ambulatory Visit (INDEPENDENT_AMBULATORY_CARE_PROVIDER_SITE_OTHER): Payer: Medicare HMO | Admitting: Family Medicine

## 2020-07-21 VITALS — BP 138/70 | HR 82 | Temp 98.3°F | Wt 166.1 lb

## 2020-07-21 DIAGNOSIS — R059 Cough, unspecified: Secondary | ICD-10-CM

## 2020-07-21 MED ORDER — HYDROCODONE BIT-HOMATROP MBR 5-1.5 MG/5ML PO SOLN: 5.0000 mL | Freq: Three times a day (TID) | ORAL | 0 refills | Status: DC | PRN

## 2020-07-21 NOTE — Progress Notes (Signed)
Established Patient Office Visit  Subjective:  Patient ID: Robin Francis, female    DOB: [birth year]  Age: 75 y.o. MRN: [id number]  CC:  Chief Complaint  Patient presents with   Cough    X 3 weeks, saw Dr. Maudie Mercury, cough is not getting better    HPI Robin Francis presents for persistent cough for about 3 weeks now.  She had virtual visit May 31.  She was given Ladona Ridgel which did initially seem to work but not now.  She is having specially severe cough at night.  Nonproductive.  No fever either initially or now.  Denies any sinus congestion.  No postnasal drip symptoms.  Patient is non-smoker.  No hemoptysis.  No appetite or weight changes.  She does have chronic GERD but is on chronic acid suppression with pantoprazole.  No active GERD symptoms.  No ACE inhibitor use.  Never smoked.  Past Medical History:  Diagnosis Date   Basal cell carcinoma (BCC) of buttock    GERD (gastroesophageal reflux disease)    Hypertension    OSTEOARTHRITIS 09/17/2008   Osteopenia 11/2018   T score -1.7 distal third of radius FRAX 8.9% / 1.1%    Past Surgical History:  Procedure Laterality Date   BACK SURGERY     LOWER   KNEE ARTHROSCOPY     left   PARTIAL KNEE ARTHROPLASTY Right 04/19/2015   Procedure: UNICOMPARTMENTAL KNEE MEDIAL REPLACMENT;  Surgeon: Gaynelle Arabian, MD;  Location: [facility];  Service: Orthopedics;  Laterality: Right;   TONSILLECTOMY     as child   TOTAL KNEE ARTHROPLASTY  04/03/2011   Procedure: TOTAL KNEE ARTHROPLASTY;  Surgeon: Gearlean Alf, MD;  Location: [facility];  Service: Orthopedics;  Laterality: Left;   TRIGGER FINGER RELEASE Left     Family History  Problem Relation Age of Onset   Pancreatic cancer Mother    Other Father        suicide   High blood pressure Brother    Breast cancer Maternal Grandmother        Age 22    Social History   Socioeconomic History   Marital status: Married    Spouse name: Not on file   Number of children: Not on file   Years of  education: Not on file   Highest education level: Not on file  Occupational History   Not on file  Tobacco Use   Smoking status: Never   Smokeless tobacco: Never  Vaping Use   Vaping Use: Never used  Substance and Sexual Activity   Alcohol use: Yes    Alcohol/week: 4.0 standard drinks    Types: 4 Standard drinks or equivalent per week    Comment: occassionally-social   Drug use: No   Sexual activity: Not Currently    Birth control/protection: Post-menopausal    Comment: 1st intercourse 75 yo-Fewer than 5 partners  Other Topics Concern   Not on file  Social History Narrative   Not on file   Social Determinants of Health   Financial Resource Strain: Low Risk    Difficulty of Paying Living Expenses: Not hard at all  Food Insecurity: No Food Insecurity   Worried About Charity fundraiser in the Last Year: Never true   Ran Out of Food in the Last Year: Never true  Transportation Needs: No Transportation Needs   Lack of Transportation (Medical): No   Lack of Transportation (Non-Medical): No  Physical Activity: Sufficiently Active   Days of  Exercise per Week: 5 days   Minutes of Exercise per Session: 40 min  Stress: No Stress Concern Present   Feeling of Stress : Not at all  Social Connections: Moderately Isolated   Frequency of Communication with Friends and Family: More than three times a week   Frequency of Social Gatherings with Friends and Family: Twice a week   Attends Religious Services: Never   Marine scientist or Organizations: No   Attends Music therapist: Never   Marital Status: Married  Human resources officer Violence: Not At Risk   Fear of Current or Ex-Partner: No   Emotionally Abused: No   Physically Abused: No   Sexually Abused: No    Outpatient Medications Prior to Visit  Medication Sig Dispense Refill   amLODipine (NORVASC) 2.5 MG tablet TAKE 1 TABLET BY MOUTH EVERY DAY 30 tablet 5   Ascorbic Acid (VITAMIN C) 1000 MG tablet Take 1,000 mg  by mouth daily.     benzonatate (TESSALON) 200 MG capsule Take 1 capsule (200 mg total) by mouth 3 (three) times daily as needed. 20 capsule 0   calcium carbonate (OS-CAL) 600 MG TABS tablet Take 600 mg by mouth 2 (two) times daily with a meal.     cetirizine (ZYRTEC) 10 MG tablet Take 10 mg by mouth daily.     Cholecalciferol (VITAMIN D) 2000 units tablet Take 2,000 Units by mouth daily.     CINNAMON PO Take 1,000 mg by mouth daily.     Coenzyme Q10 (CO Q 10 PO) Take 120 mg by mouth.     losartan-hydrochlorothiazide (HYZAAR) 100-25 MG tablet TAKE 1 TABLET BY MOUTH EVERY DAY 90 tablet 0   Multiple Vitamin (MULTIVITAMIN) tablet Take 1 tablet by mouth daily.     Omega 3-6-9 Fatty Acids (OMEGA 3-6-9 COMPLEX PO) Take by mouth.     OVER THE COUNTER MEDICATION      pantoprazole (PROTONIX) 40 MG tablet Take 1 tablet (40 mg total) by mouth daily. 90 tablet 1   Red Yeast Rice Extract (RED YEAST RICE PO) Take 240 mg by mouth 2 (two) times daily.     thiamine (VITAMIN B-1) 100 MG tablet Take 100 mg by mouth daily.     vitamin E 400 UNIT capsule Take 400 Units by mouth daily.     No facility-administered medications prior to visit.    Allergies  Allergen Reactions   Lisinopril Other (See Comments)    Cough    ROS Review of Systems  Constitutional:  Negative for appetite change, chills, fever and unexpected weight change.  HENT:  Positive for voice change. Negative for congestion.   Respiratory:  Positive for cough. Negative for shortness of breath and wheezing.   Cardiovascular:  Negative for chest pain.     Objective:    Physical Exam Vitals reviewed.  HENT:     Right Ear: Tympanic membrane normal.     Left Ear: Tympanic membrane normal.  Cardiovascular:     Rate and Rhythm: Normal rate and regular rhythm.  Pulmonary:     Effort: Pulmonary effort is normal.     Breath sounds: Normal breath sounds. No wheezing or rales.  Musculoskeletal:     Cervical back: Neck supple.   Lymphadenopathy:     Cervical: No cervical adenopathy.  Neurological:     Mental Status: She is alert.    BP 138/70 (BP Location: Left Arm, Patient Position: Sitting, Cuff Size: Normal)   Pulse 82   Temp  98.3 F (36.8 C) (Oral)   Wt 166 lb 1.6 oz (75.3 kg)   SpO2 97%   BMI 35.94 kg/m  Wt Readings from Last 3 Encounters:  07/21/20 166 lb 1.6 oz (75.3 kg)  12/31/19 165 lb 3.2 oz (74.9 kg)  11/12/19 166 lb (75.3 kg)     There are no preventive care reminders to display for this patient.  There are no preventive care reminders to display for this patient.  Lab Results  Component Value Date   TSH 1.28 12/31/2019   Lab Results  Component Value Date   WBC 7.5 12/31/2019   HGB 14.2 12/31/2019   HCT 41.5 12/31/2019   MCV 90.6 12/31/2019   PLT 328 12/31/2019   Lab Results  Component Value Date   NA 142 12/31/2019   K 4.1 12/31/2019   CO2 30 12/31/2019   GLUCOSE 99 12/31/2019   BUN 13 12/31/2019   CREATININE 0.73 12/31/2019   BILITOT 0.9 02/10/2020   ALKPHOS 74 02/10/2020   AST 22 02/10/2020   ALT 22 02/10/2020   PROT 6.5 02/10/2020   ALBUMIN 4.4 02/10/2020   CALCIUM 10.4 12/31/2019   ANIONGAP 11 04/20/2015   GFR 81.05 10/10/2018   Lab Results  Component Value Date   CHOL 255 (H) 12/31/2019   Lab Results  Component Value Date   HDL 67 12/31/2019   Lab Results  Component Value Date   LDLCALC 165 (H) 12/31/2019   Lab Results  Component Value Date   TRIG 115 12/31/2019   Lab Results  Component Value Date   CHOLHDL 3.8 12/31/2019   Lab Results  Component Value Date   HGBA1C 5.4 12/31/2019      Assessment & Plan:   Cough of roughly 3 weeks duration.  Nonfocal exam.  She is not any red flags such as fever, appetite change, weight loss, hemoptysis, dyspnea, etc.  Nonfocal exam.  Suspect residual from recent acute bronchitis.  -Hycodan 1 teaspoon nightly for severe nighttime cough -Recommend follow-up if cough and hoarseness not fully resolved  within the next 2 weeks.  If hoarseness not completely better at that point recommend ENT referral and follow-up sooner for any fever or other concerns.  Would also recommend chest x-ray in a couple weeks if cough not resolving.  Meds ordered this encounter  Medications   HYDROcodone bit-homatropine (HYCODAN) 5-1.5 MG/5ML syrup    Sig: Take 5 mLs by mouth every 8 (eight) hours as needed for cough.    Dispense:  120 mL    Refill:  0    Follow-up: No follow-ups on file.    Carolann Littler, MD

## 2020-07-21 NOTE — Patient Instructions (Signed)
Please let us know if hoarseness not clearing in the next couple of weeks.

## 2020-07-29 ENCOUNTER — Other Ambulatory Visit: Payer: Self-pay | Admitting: Family Medicine

## 2020-07-29 DIAGNOSIS — Z1231 Encounter for screening mammogram for malignant neoplasm of breast: Secondary | ICD-10-CM | POA: Diagnosis not present

## 2020-07-29 DIAGNOSIS — I10 Essential (primary) hypertension: Secondary | ICD-10-CM

## 2020-09-13 ENCOUNTER — Encounter: Payer: Medicare HMO | Admitting: Family Medicine

## 2020-09-30 ENCOUNTER — Other Ambulatory Visit: Payer: Self-pay | Admitting: Family Medicine

## 2020-09-30 DIAGNOSIS — K219 Gastro-esophageal reflux disease without esophagitis: Secondary | ICD-10-CM

## 2020-10-09 ENCOUNTER — Other Ambulatory Visit: Payer: Self-pay | Admitting: Family Medicine

## 2020-10-09 DIAGNOSIS — I10 Essential (primary) hypertension: Secondary | ICD-10-CM

## 2020-11-15 ENCOUNTER — Ambulatory Visit: Payer: Medicare HMO | Admitting: Obstetrics & Gynecology

## 2020-11-15 ENCOUNTER — Ambulatory Visit: Payer: Medicare HMO | Admitting: Obstetrics and Gynecology

## 2020-11-23 ENCOUNTER — Ambulatory Visit (INDEPENDENT_AMBULATORY_CARE_PROVIDER_SITE_OTHER): Payer: Medicare HMO | Admitting: Obstetrics & Gynecology

## 2020-11-23 ENCOUNTER — Encounter: Payer: Self-pay | Admitting: Obstetrics & Gynecology

## 2020-11-23 ENCOUNTER — Other Ambulatory Visit: Payer: Self-pay

## 2020-11-23 VITALS — BP 110/74 | HR 78 | Resp 16 | Ht <= 58 in | Wt 168.0 lb

## 2020-11-23 DIAGNOSIS — Z780 Asymptomatic menopausal state: Secondary | ICD-10-CM | POA: Diagnosis not present

## 2020-11-23 DIAGNOSIS — Z6836 Body mass index (BMI) 36.0-36.9, adult: Secondary | ICD-10-CM

## 2020-11-23 DIAGNOSIS — M8580 Other specified disorders of bone density and structure, unspecified site: Secondary | ICD-10-CM

## 2020-11-23 DIAGNOSIS — Z01419 Encounter for gynecological examination (general) (routine) without abnormal findings: Secondary | ICD-10-CM

## 2020-11-23 NOTE — Progress Notes (Signed)
Robin Francis 03-15-1945 [id number]   History:    75 y.o. G2P2L2  RP:  Established patient presenting for annual gyn exam and management of Osteopenia   HPI: Postmenopausal, well on no HRT.  No PMB.  No pelvic pain. Pap smear 11/2016.  No significant history of abnormal Pap smears.  Mammogram 07/2020 Negative. Colonoscopy 2018. Osteopenia. DEXA 11/2018, T score -1.7, FRAX 8.9% / 1.1%.  BMI 36.68  Health labs with Fam MD.   Past medical history,surgical history, family history and social history were all reviewed and documented in the EPIC chart.  Gynecologic History No LMP recorded. Patient is postmenopausal.  Obstetric History OB History  Gravida Para Term Preterm AB Living  2 2 2     2   SAB IAB Ectopic Multiple Live Births               # Outcome Date GA Lbr Len/2nd Weight Sex Delivery Anes PTL Lv  2 Term           1 Term              ROS: A ROS was performed and pertinent positives and negatives are included in the history.  GENERAL: No fevers or chills. HEENT: No change in vision, no earache, sore throat or sinus congestion. NECK: No pain or stiffness. CARDIOVASCULAR: No chest pain or pressure. No palpitations. PULMONARY: No shortness of breath, cough or wheeze. GASTROINTESTINAL: No abdominal pain, nausea, vomiting or diarrhea, melena or bright red blood per rectum. GENITOURINARY: No urinary frequency, urgency, hesitancy or dysuria. MUSCULOSKELETAL: No joint or muscle pain, no back pain, no recent trauma. DERMATOLOGIC: No rash, no itching, no lesions. ENDOCRINE: No polyuria, polydipsia, no heat or cold intolerance. No recent change in weight. HEMATOLOGICAL: No anemia or easy bruising or bleeding. NEUROLOGIC: No headache, seizures, numbness, tingling or weakness. PSYCHIATRIC: No depression, no loss of interest in normal activity or change in sleep pattern.     Exam:   BP 110/74   Pulse 78   Resp 16   Ht 4' 8.75" (1.441 m)   Wt 168 lb (76.2 kg)   BMI 36.68 kg/m   Body  mass index is 36.68 kg/m.  General appearance : Well developed well nourished female. No acute distress HEENT: Eyes: no retinal hemorrhage or exudates,  Neck supple, trachea midline, no carotid bruits, no thyroidmegaly Lungs: Clear to auscultation, no rhonchi or wheezes, or rib retractions  Heart: Regular rate and rhythm, no murmurs or gallops Breast:Examined in sitting and supine position were symmetrical in appearance, no palpable masses or tenderness,  no skin retraction, no nipple inversion, no nipple discharge, no skin discoloration, no axillary or supraclavicular lymphadenopathy Abdomen: no palpable masses or tenderness, no rebound or guarding Extremities: no edema or skin discoloration or tenderness  Pelvic: Vulva: Normal             Vagina: No gross lesions or discharge  Cervix: No gross lesions or discharge  Uterus  AV, normal size, shape and consistency, non-tender and mobile  Adnexa  Without masses or tenderness  Anus: Normal   Assessment/Plan:  75 y.o. female for annual exam   1. Well female exam with routine gynecological exam Normal gynecologic exam.  No indication of a Pap test at this time.  Breast exam normal.  Screening mammogram Negative June 2022.  Colonoscopy 2018.  Health labs with family physician.  2. Postmenopause Well on no hormone replacement therapy.  No postmenopausal bleeding.  3. Osteopenia, unspecified location  Osteopenia on bone density October 2020 with a T score of -1.7.  FRAX was not high.  We will repeat a bone density at 3 years.  Continue with vitamin D supplements, calcium intake of 1.5 g/day total and regular weightbearing physical activities.  4. Class 2 severe obesity due to excess calories with serious comorbidity and body mass index (BMI) of 36.0 to 36.9 in adult [facility]) Recommend a lower calorie/carb diet.  Increase aerobic activities to 5 times a week and light weightlifting every 2 days.  Other orders - UNABLE TO FIND; Beet root    Princess Bruins MD, 12:20 PM 11/23/2020

## 2020-11-26 ENCOUNTER — Other Ambulatory Visit: Payer: Self-pay | Admitting: Family Medicine

## 2020-11-26 DIAGNOSIS — I10 Essential (primary) hypertension: Secondary | ICD-10-CM

## 2020-12-10 DIAGNOSIS — K219 Gastro-esophageal reflux disease without esophagitis: Secondary | ICD-10-CM | POA: Diagnosis not present

## 2020-12-10 DIAGNOSIS — M1990 Unspecified osteoarthritis, unspecified site: Secondary | ICD-10-CM | POA: Diagnosis not present

## 2020-12-10 DIAGNOSIS — Z6837 Body mass index (BMI) 37.0-37.9, adult: Secondary | ICD-10-CM | POA: Diagnosis not present

## 2020-12-10 DIAGNOSIS — I739 Peripheral vascular disease, unspecified: Secondary | ICD-10-CM | POA: Diagnosis not present

## 2020-12-10 DIAGNOSIS — Z809 Family history of malignant neoplasm, unspecified: Secondary | ICD-10-CM | POA: Diagnosis not present

## 2020-12-10 DIAGNOSIS — Z008 Encounter for other general examination: Secondary | ICD-10-CM | POA: Diagnosis not present

## 2020-12-10 DIAGNOSIS — I10 Essential (primary) hypertension: Secondary | ICD-10-CM | POA: Diagnosis not present

## 2020-12-13 DIAGNOSIS — L814 Other melanin hyperpigmentation: Secondary | ICD-10-CM | POA: Diagnosis not present

## 2020-12-13 DIAGNOSIS — I872 Venous insufficiency (chronic) (peripheral): Secondary | ICD-10-CM | POA: Diagnosis not present

## 2020-12-13 DIAGNOSIS — L821 Other seborrheic keratosis: Secondary | ICD-10-CM | POA: Diagnosis not present

## 2020-12-13 DIAGNOSIS — L218 Other seborrheic dermatitis: Secondary | ICD-10-CM | POA: Diagnosis not present

## 2020-12-13 DIAGNOSIS — I8312 Varicose veins of left lower extremity with inflammation: Secondary | ICD-10-CM | POA: Diagnosis not present

## 2020-12-13 DIAGNOSIS — D225 Melanocytic nevi of trunk: Secondary | ICD-10-CM | POA: Diagnosis not present

## 2020-12-13 DIAGNOSIS — I8311 Varicose veins of right lower extremity with inflammation: Secondary | ICD-10-CM | POA: Diagnosis not present

## 2020-12-13 DIAGNOSIS — D0371 Melanoma in situ of right lower limb, including hip: Secondary | ICD-10-CM | POA: Diagnosis not present

## 2020-12-13 DIAGNOSIS — Z85828 Personal history of other malignant neoplasm of skin: Secondary | ICD-10-CM | POA: Diagnosis not present

## 2020-12-21 DIAGNOSIS — D0371 Melanoma in situ of right lower limb, including hip: Secondary | ICD-10-CM | POA: Diagnosis not present

## 2020-12-21 DIAGNOSIS — L988 Other specified disorders of the skin and subcutaneous tissue: Secondary | ICD-10-CM | POA: Diagnosis not present

## 2020-12-21 DIAGNOSIS — C4371 Malignant melanoma of right lower limb, including hip: Secondary | ICD-10-CM | POA: Diagnosis not present

## 2020-12-28 DIAGNOSIS — H524 Presbyopia: Secondary | ICD-10-CM | POA: Diagnosis not present

## 2020-12-28 DIAGNOSIS — H2513 Age-related nuclear cataract, bilateral: Secondary | ICD-10-CM | POA: Diagnosis not present

## 2021-01-07 ENCOUNTER — Encounter: Payer: Self-pay | Admitting: Family Medicine

## 2021-01-07 ENCOUNTER — Ambulatory Visit (INDEPENDENT_AMBULATORY_CARE_PROVIDER_SITE_OTHER): Payer: Medicare HMO | Admitting: Family Medicine

## 2021-01-07 VITALS — BP 130/80 | HR 69 | Temp 97.5°F | Ht <= 58 in | Wt 166.8 lb

## 2021-01-07 DIAGNOSIS — Z0000 Encounter for general adult medical examination without abnormal findings: Secondary | ICD-10-CM

## 2021-01-07 DIAGNOSIS — I10 Essential (primary) hypertension: Secondary | ICD-10-CM | POA: Diagnosis not present

## 2021-01-07 DIAGNOSIS — R7309 Other abnormal glucose: Secondary | ICD-10-CM | POA: Diagnosis not present

## 2021-01-07 DIAGNOSIS — Z23 Encounter for immunization: Secondary | ICD-10-CM | POA: Diagnosis not present

## 2021-01-07 DIAGNOSIS — E785 Hyperlipidemia, unspecified: Secondary | ICD-10-CM | POA: Diagnosis not present

## 2021-01-07 LAB — COMPREHENSIVE METABOLIC PANEL
ALT: 30 U/L (ref 0–35)
AST: 32 U/L (ref 0–37)
Albumin: 4.5 g/dL (ref 3.5–5.2)
Alkaline Phosphatase: 66 U/L (ref 39–117)
BUN: 19 mg/dL (ref 6–23)
CO2: 30 mEq/L (ref 19–32)
Calcium: 10.1 mg/dL (ref 8.4–10.5)
Chloride: 99 mEq/L (ref 96–112)
Creatinine, Ser: 0.82 mg/dL (ref 0.40–1.20)
GFR: 70.82 mL/min (ref >60.00)
Glucose, Bld: 100 mg/dL — ABNORMAL HIGH (ref 70–99)
Potassium: 4.3 mEq/L (ref 3.5–5.1)
Sodium: 137 mEq/L (ref 135–145)
Total Bilirubin: 1 mg/dL (ref 0.2–1.2)
Total Protein: 7.3 g/dL (ref 6.0–8.3)

## 2021-01-07 LAB — CBC WITH DIFFERENTIAL/PLATELET
Basophils Absolute: 0.1 10*3/uL (ref 0.0–0.1)
Basophils Relative: 0.9 % (ref 0.0–3.0)
Eosinophils Absolute: 0.1 10*3/uL (ref 0.0–0.7)
Eosinophils Relative: 1.3 % (ref 0.0–5.0)
HCT: 41 % (ref 36.0–46.0)
Hemoglobin: 13.8 g/dL (ref 12.0–15.0)
Lymphocytes Relative: 31.1 % (ref 12.0–46.0)
Lymphs Abs: 2.3 10*3/uL (ref 0.7–4.0)
MCHC: 33.6 g/dL (ref 30.0–36.0)
MCV: 90.6 fl (ref 78.0–100.0)
Monocytes Absolute: 0.6 10*3/uL (ref 0.1–1.0)
Monocytes Relative: 7.9 % (ref 3.0–12.0)
Neutro Abs: 4.3 10*3/uL (ref 1.4–7.7)
Neutrophils Relative %: 58.8 % (ref 43.0–77.0)
Platelets: 264 10*3/uL (ref 150.0–400.0)
RBC: 4.52 Mil/uL (ref 3.87–5.11)
RDW: 13.4 % (ref 11.5–15.5)
WBC: 7.3 10*3/uL (ref 4.0–10.5)

## 2021-01-07 LAB — LIPID PANEL
Cholesterol: 235 mg/dL — ABNORMAL HIGH (ref 0–200)
HDL: 68.6 mg/dL (ref >39.00)
LDL Cholesterol: 147 mg/dL — ABNORMAL HIGH (ref 0–99)
NonHDL: 166.4
Total CHOL/HDL Ratio: 3
Triglycerides: 98 mg/dL (ref 0.0–149.0)
VLDL: 19.6 mg/dL (ref 0.0–40.0)

## 2021-01-07 LAB — HEMOGLOBIN A1C: Hgb A1c MFr Bld: 5.6 % (ref 4.6–6.5)

## 2021-01-07 NOTE — Addendum Note (Signed)
Addended by: Agnes Lawrence on: 01/07/2021 08:43 AM   Modules accepted: Orders

## 2021-01-07 NOTE — Progress Notes (Signed)
Robin Francis DOB: 04/06/1945 Encounter date: 01/07/2021  This is a 75 y.o. female who presents for complete physical   History of present illness/Additional concerns:   Follows with Dr. Delilah Shan for gyn needs. Had visit last month.  Mammogram: normal 07/2020 Colonoscopy 2018; repeat suggested 01/2022 Osteopenia: managed by gyn. Due for dexa this year. Gyn isi going to wait until next year.   Follows with [facility] derm.   HTN: losartan-hctz 100-25  Prevnar 20:will complete today Tdap due.    Past Medical History:  Diagnosis Date   Basal cell carcinoma (BCC) of buttock    GERD (gastroesophageal reflux disease)    Hypertension    OSTEOARTHRITIS 09/17/2008   Osteopenia 11/2018   T score -1.7 distal third of radius FRAX 8.9% / 1.1%   Past Surgical History:  Procedure Laterality Date   BACK SURGERY     LOWER   KNEE ARTHROSCOPY     left   PARTIAL KNEE ARTHROPLASTY Right 04/19/2015   Procedure: UNICOMPARTMENTAL KNEE MEDIAL REPLACMENT;  Surgeon: Gaynelle Arabian, MD;  Location: [facility];  Service: Orthopedics;  Laterality: Right;   TONSILLECTOMY     as child   TOTAL KNEE ARTHROPLASTY  04/03/2011   Procedure: TOTAL KNEE ARTHROPLASTY;  Surgeon: Gearlean Alf, MD;  Location: [facility];  Service: Orthopedics;  Laterality: Left;   TRIGGER FINGER RELEASE Left    Allergies  Allergen Reactions   Lisinopril Other (See Comments)    Cough   Current Meds  Medication Sig   amLODipine (NORVASC) 2.5 MG tablet TAKE 1 TABLET BY MOUTH EVERY DAY   Ascorbic Acid (VITAMIN C) 1000 MG tablet Take 1,000 mg by mouth daily.   calcium carbonate (OS-CAL) 600 MG TABS tablet Take 600 mg by mouth 2 (two) times daily with a meal.   cetirizine (ZYRTEC) 10 MG tablet Take 10 mg by mouth daily.   Cholecalciferol (VITAMIN D) 2000 units tablet Take 2,000 Units by mouth daily.   CINNAMON PO Take 1,000 mg by mouth daily.   Coenzyme Q10 (CO Q 10 PO) Take 120 mg by mouth.   losartan-hydrochlorothiazide (HYZAAR)  100-25 MG tablet TAKE 1 TABLET BY MOUTH EVERY DAY   Multiple Vitamin (MULTIVITAMIN) tablet Take 1 tablet by mouth daily.   Omega 3-6-9 Fatty Acids (OMEGA 3-6-9 COMPLEX PO) Take by mouth.   pantoprazole (PROTONIX) 40 MG tablet TAKE 1 TABLET BY MOUTH EVERY DAY   Red Yeast Rice Extract (RED YEAST RICE PO) Take 240 mg by mouth 2 (two) times daily.   thiamine (VITAMIN B-1) 100 MG tablet Take 100 mg by mouth daily.   UNABLE TO FIND Beet root   vitamin E 400 UNIT capsule Take 400 Units by mouth daily.   Social History   Tobacco Use   Smoking status: Never   Smokeless tobacco: Never  Substance Use Topics   Alcohol use: Not Currently    Comment: occassionally-social   Family History  Problem Relation Age of Onset   Pancreatic cancer Mother    Other Father        suicide   High blood pressure Brother    Breast cancer Maternal Grandmother        Age 53     Review of Systems  Constitutional:  Negative for activity change, appetite change, chills, fatigue, fever and unexpected weight change.  HENT:  Negative for congestion, ear pain, hearing loss, sinus pressure, sinus pain, sore throat and trouble swallowing.   Eyes:  Negative for pain and visual disturbance.  Respiratory:  Negative for cough, chest tightness, shortness of breath and wheezing.   Cardiovascular:  Negative for chest pain, palpitations and leg swelling.  Gastrointestinal:  Negative for abdominal pain, blood in stool, constipation, diarrhea, nausea and vomiting.  Genitourinary:  Negative for difficulty urinating and menstrual problem.  Musculoskeletal:  Negative for arthralgias and back pain.  Skin:  Negative for rash.  Neurological:  Negative for dizziness, weakness, numbness and headaches.  Hematological:  Negative for adenopathy. Does not bruise/bleed easily.  Psychiatric/Behavioral:  Negative for sleep disturbance and suicidal ideas. The patient is not nervous/anxious.    CBC:  Lab Results  Component Value Date    WBC 7.5 12/31/2019   HGB 14.2 12/31/2019   HCT 41.5 12/31/2019   MCH 31.0 12/31/2019   MCHC 34.2 12/31/2019   RDW 12.3 12/31/2019   PLT 328 12/31/2019   MPV 10.7 12/31/2019   CMP: Lab Results  Component Value Date   NA 142 12/31/2019   K 4.1 12/31/2019   CL 100 12/31/2019   CO2 30 12/31/2019   ANIONGAP 11 04/20/2015   GLUCOSE 99 12/31/2019   BUN 13 12/31/2019   CREATININE 0.73 12/31/2019   GFRAA >60 04/20/2015   CALCIUM 10.4 12/31/2019   PROT 6.5 02/10/2020   BILITOT 0.9 02/10/2020   ALKPHOS 74 02/10/2020   ALT 22 02/10/2020   AST 22 02/10/2020   LIPID: Lab Results  Component Value Date   CHOL 255 (H) 12/31/2019   TRIG 115 12/31/2019   HDL 67 12/31/2019   LDLCALC 165 (H) 12/31/2019    Objective:  BP 130/80   Pulse 69   Temp (!) 97.5 F (36.4 C) (Oral)   Ht 4\' 9"  (1.448 m)   Wt 166 lb 12.8 oz (75.7 kg)   SpO2 99%   BMI 36.10 kg/m   Weight: 166 lb 12.8 oz (75.7 kg)   BP Readings from Last 3 Encounters:  01/07/21 130/80  11/23/20 110/74  07/21/20 138/70   Wt Readings from Last 3 Encounters:  01/07/21 166 lb 12.8 oz (75.7 kg)  11/23/20 168 lb (76.2 kg)  07/21/20 166 lb 1.6 oz (75.3 kg)    Physical Exam Constitutional:      General: She is not in acute distress.    Appearance: She is well-developed.  HENT:     Head: Normocephalic and atraumatic.     Right Ear: External ear normal.     Left Ear: External ear normal.     Mouth/Throat:     Pharynx: No oropharyngeal exudate.  Eyes:     Conjunctiva/sclera: Conjunctivae normal.     Pupils: Pupils are equal, round, and reactive to light.  Neck:     Thyroid: No thyromegaly.  Cardiovascular:     Rate and Rhythm: Normal rate and regular rhythm.     Heart sounds: Normal heart sounds. No murmur heard.   No friction rub. No gallop.  Pulmonary:     Effort: Pulmonary effort is normal.     Breath sounds: Normal breath sounds.  Abdominal:     General: Bowel sounds are normal. There is no distension.      Palpations: Abdomen is soft. There is no mass.     Tenderness: There is no abdominal tenderness. There is no guarding.     Hernia: No hernia is present.  Musculoskeletal:        General: No tenderness or deformity. Normal range of motion.     Cervical back: Normal range of motion and neck supple.  Lymphadenopathy:     Cervical: No cervical adenopathy.  Skin:    General: Skin is warm and dry.     Findings: No rash.  Neurological:     Mental Status: She is alert and oriented to person, place, and time.     Deep Tendon Reflexes: Reflexes normal.     Reflex Scores:      Tricep reflexes are 2+ on the right side and 2+ on the left side.      Bicep reflexes are 2+ on the right side and 2+ on the left side.      Brachioradialis reflexes are 2+ on the right side and 2+ on the left side.      Patellar reflexes are 2+ on the right side and 2+ on the left side. Psychiatric:        Speech: Speech normal.        Behavior: Behavior normal.        Thought Content: Thought content normal.    Assessment/Plan: There are no preventive care reminders to display for this patient.  Health Maintenance reviewed.  1. Preventative health care Keep up with regular walking. Discussed adding in higher intensity intervals to walking to help with weight loss. Work on portion control as well. Lower carb diet suggested to help with weight loss; hx of prediabetes although last A1C was normal.  2. Essential hypertension, benign Bp controlled. Continue with current losartna-hctz 100-25mg  dose.   3. Hyperlipidemia, unspecified hyperlipidemia type Has been diet controlled. Will recheck lipids today.   4. Elevated glucose Recheck A1C today.    Return in about 6 months (around 07/08/2021) for Chronic condition visit.  Micheline Rough, MD

## 2021-01-07 NOTE — Patient Instructions (Signed)
When you get a chance, get the Tdap

## 2021-01-17 ENCOUNTER — Telehealth: Payer: Self-pay

## 2021-01-17 NOTE — Telephone Encounter (Signed)
Per phone note from [facility], the patient was informed of the results.

## 2021-01-17 NOTE — Telephone Encounter (Signed)
Patient returned call and was informed of results verbalized understanding and stated she viewed results on MyChart

## 2021-02-24 ENCOUNTER — Other Ambulatory Visit: Payer: Self-pay | Admitting: Family Medicine

## 2021-02-24 DIAGNOSIS — I10 Essential (primary) hypertension: Secondary | ICD-10-CM

## 2021-03-21 ENCOUNTER — Ambulatory Visit (INDEPENDENT_AMBULATORY_CARE_PROVIDER_SITE_OTHER): Payer: Medicare HMO

## 2021-03-21 VITALS — Ht <= 58 in | Wt 165.0 lb

## 2021-03-21 DIAGNOSIS — Z0000 Encounter for general adult medical examination without abnormal findings: Secondary | ICD-10-CM | POA: Diagnosis not present

## 2021-03-21 NOTE — Patient Instructions (Signed)
Robin Francis , Thank you for taking time to come for your Medicare Wellness Visit. I appreciate your ongoing commitment to your health goals. Please review the following plan we discussed and let me know if I can assist you in the future.   Screening recommendations/referrals: Colonoscopy: completed 01/09/2017, due 01/09/2022 Mammogram: completed 07/29/2020, due 07/30/2021 Bone Density: completed 12/03/2018 Recommended yearly ophthalmology/optometry visit for glaucoma screening and checkup Recommended yearly dental visit for hygiene and checkup  Vaccinations: Influenza vaccine: completed 11/18/2020, due next flu season Pneumococcal vaccine: completed 10/06/2013 Tdap vaccine: due now Shingles vaccine: completed    Covid-19: 11/18/2020, 05/11/2020, 11/11/2019, 04/03/2019, 03/06/2019  Advanced directives: Please bring a copy of your POA (Power of Attorney) and/or Living Will to your next appointment.   Conditions/risks identified: none  Next appointment: Follow up in one year for your annual wellness visit    Preventive Care 65 Years and Older, Female Preventive care refers to lifestyle choices and visits with your health care provider that can promote health and wellness. What does preventive care include? A yearly physical exam. This is also called an annual well check. Dental exams once or twice a year. Routine eye exams. Ask your health care provider how often you should have your eyes checked. Personal lifestyle choices, including: Daily care of your teeth and gums. Regular physical activity. Eating a healthy diet. Avoiding tobacco and drug use. Limiting alcohol use. Practicing safe sex. Taking low-dose aspirin every day. Taking vitamin and mineral supplements as recommended by your health care provider. What happens during an annual well check? The services and screenings done by your health care provider during your annual well check will depend on your age, overall health, lifestyle  risk factors, and family history of disease. Counseling  Your health care provider may ask you questions about your: Alcohol use. Tobacco use. Drug use. Emotional well-being. Home and relationship well-being. Sexual activity. Eating habits. History of falls. Memory and ability to understand (cognition). Work and work Statistician. Reproductive health. Screening  You may have the following tests or measurements: Height, weight, and BMI. Blood pressure. Lipid and cholesterol levels. These may be checked every 5 years, or more frequently if you are over 60 years old. Skin check. Lung cancer screening. You may have this screening every year starting at age 41 if you have a 30-pack-year history of smoking and currently smoke or have quit within the past 15 years. Fecal occult blood test (FOBT) of the stool. You may have this test every year starting at age 49. Flexible sigmoidoscopy or colonoscopy. You may have a sigmoidoscopy every 5 years or a colonoscopy every 10 years starting at age 89. Hepatitis C blood test. Hepatitis B blood test. Sexually transmitted disease (STD) testing. Diabetes screening. This is done by checking your blood sugar (glucose) after you have not eaten for a while (fasting). You may have this done every 1-3 years. Bone density scan. This is done to screen for osteoporosis. You may have this done starting at age 66. Mammogram. This may be done every 1-2 years. Talk to your health care provider about how often you should have regular mammograms. Talk with your health care provider about your test results, treatment options, and if necessary, the need for more tests. Vaccines  Your health care provider may recommend certain vaccines, such as: Influenza vaccine. This is recommended every year. Tetanus, diphtheria, and acellular pertussis (Tdap, Td) vaccine. You may need a Td booster every 10 years. Zoster vaccine. You may need this after  age 55. Pneumococcal  13-valent conjugate (PCV13) vaccine. One dose is recommended after age 10. Pneumococcal polysaccharide (PPSV23) vaccine. One dose is recommended after age 90. Talk to your health care provider about which screenings and vaccines you need and how often you need them. This information is not intended to replace advice given to you by your health care provider. Make sure you discuss any questions you have with your health care provider. Document Released: 02/19/2015 Document Revised: 10/13/2015 Document Reviewed: 11/24/2014 Elsevier Interactive Patient Education  2017 [place] Prevention in the Home Falls can cause injuries. They can happen to people of all ages. There are many things you can do to make your home safe and to help prevent falls. What can I do on the outside of my home? Regularly fix the edges of walkways and driveways and fix any cracks. Remove anything that might make you trip as you walk through a door, such as a raised step or threshold. Trim any bushes or trees on the path to your home. Use bright outdoor lighting. Clear any walking paths of anything that might make someone trip, such as rocks or tools. Regularly check to see if handrails are loose or broken. Make sure that both sides of any steps have handrails. Any raised decks and porches should have guardrails on the edges. Have any leaves, snow, or ice cleared regularly. Use sand or salt on walking paths during winter. Clean up any spills in your garage right away. This includes oil or grease spills. What can I do in the bathroom? Use night lights. Install grab bars by the toilet and in the tub and shower. Do not use towel bars as grab bars. Use non-skid mats or decals in the tub or shower. If you need to sit down in the shower, use a plastic, non-slip stool. Keep the floor dry. Clean up any water that spills on the floor as soon as it happens. Remove soap buildup in the tub or shower regularly. Attach  bath mats securely with double-sided non-slip rug tape. Do not have throw rugs and other things on the floor that can make you trip. What can I do in the bedroom? Use night lights. Make sure that you have a light by your bed that is easy to reach. Do not use any sheets or blankets that are too big for your bed. They should not hang down onto the floor. Have a firm chair that has side arms. You can use this for support while you get dressed. Do not have throw rugs and other things on the floor that can make you trip. What can I do in the kitchen? Clean up any spills right away. Avoid walking on wet floors. Keep items that you use a lot in easy-to-reach places. If you need to reach something above you, use a strong step stool that has a grab bar. Keep electrical cords out of the way. Do not use floor polish or wax that makes floors slippery. If you must use wax, use non-skid floor wax. Do not have throw rugs and other things on the floor that can make you trip. What can I do with my stairs? Do not leave any items on the stairs. Make sure that there are handrails on both sides of the stairs and use them. Fix handrails that are broken or loose. Make sure that handrails are as long as the stairways. Check any carpeting to make sure that it is firmly attached to the stairs.  Fix any carpet that is loose or worn. Avoid having throw rugs at the top or bottom of the stairs. If you do have throw rugs, attach them to the floor with carpet tape. Make sure that you have a light switch at the top of the stairs and the bottom of the stairs. If you do not have them, ask someone to add them for you. What else can I do to help prevent falls? Wear shoes that: Do not have high heels. Have rubber bottoms. Are comfortable and fit you well. Are closed at the toe. Do not wear sandals. If you use a stepladder: Make sure that it is fully opened. Do not climb a closed stepladder. Make sure that both sides of the  stepladder are locked into place. Ask someone to hold it for you, if possible. Clearly mark and make sure that you can see: Any grab bars or handrails. First and last steps. Where the edge of each step is. Use tools that help you move around (mobility aids) if they are needed. These include: Canes. Walkers. Scooters. Crutches. Turn on the lights when you go into a dark area. Replace any light bulbs as soon as they burn out. Set up your furniture so you have a clear path. Avoid moving your furniture around. If any of your floors are uneven, fix them. If there are any pets around you, be aware of where they are. Review your medicines with your doctor. Some medicines can make you feel dizzy. This can increase your chance of falling. Ask your doctor what other things that you can do to help prevent falls. This information is not intended to replace advice given to you by your health care provider. Make sure you discuss any questions you have with your health care provider. Document Released: 11/19/2008 Document Revised: 07/01/2015 Document Reviewed: 02/27/2014 Elsevier Interactive Patient Education  2017 [employer].

## 2021-03-21 NOTE — Progress Notes (Signed)
I connected with Robin Francis today by telephone and verified that I am speaking with the correct person using two identifiers. Location patient: home Location provider: work Persons participating in the virtual visit: Laresha, Bacorn LPN.   I discussed the limitations, risks, security and privacy concerns of performing an evaluation and management service by telephone and the availability of in person appointments. I also discussed with the patient that there may be a patient responsible charge related to this service. The patient expressed understanding and verbally consented to this telephonic visit.    Interactive audio and video telecommunications were attempted between this provider and patient, however failed, due to patient having technical difficulties OR patient did not have access to video capability.  We continued and completed visit with audio only.     Vital signs may be patient reported or missing.  Subjective:   Robin Francis is a 76 y.o. female who presents for Medicare Annual (Subsequent) preventive examination.  Review of Systems     Cardiac Risk Factors include: advanced age (>44men, >29 women);dyslipidemia;hypertension;obesity (BMI >30kg/m2)     Objective:    Today's Vitals   03/21/21 1341  Weight: 165 lb (74.8 kg)  Height: 4\' 9"  (1.448 m)   Body mass index is 35.71 kg/m.  Advanced Directives 03/21/2021 04/13/2020 01/09/2017 04/19/2015 04/13/2015 04/03/2011 03/21/2011  Does Patient Have a Medical Advance Directive? Yes Yes Yes Yes Yes Patient has advance directive, copy in chart Patient has advance directive, copy not in chart  Type of Advance Directive [place];Living will [facility];Living will [place];Living will [place];Living will [place];Living will - [place];Living will  Does patient want to make changes to medical advance  directive? - No - Patient declined - No - Patient declined No - Patient declined - -  Copy of [place] in Chart? No - copy requested No - copy requested No - copy requested Yes Yes - Copy requested from family  Pre-existing out of facility DNR order (yellow form or pink MOST form) - - - - - - No    Current Medications (verified) Outpatient Encounter Medications as of 03/21/2021  Medication Sig   amLODipine (NORVASC) 2.5 MG tablet TAKE 1 TABLET BY MOUTH EVERY DAY   Ascorbic Acid (VITAMIN C) 1000 MG tablet Take 1,000 mg by mouth daily.   calcium carbonate (OS-CAL) 600 MG TABS tablet Take 600 mg by mouth 2 (two) times daily with a meal.   cetirizine (ZYRTEC) 10 MG tablet Take 10 mg by mouth daily.   Cholecalciferol (VITAMIN D) 2000 units tablet Take 2,000 Units by mouth daily.   CINNAMON PO Take 1,000 mg by mouth daily.   Coenzyme Q10 (CO Q 10 PO) Take 120 mg by mouth.   losartan-hydrochlorothiazide (HYZAAR) 100-25 MG tablet TAKE 1 TABLET BY MOUTH EVERY DAY   Multiple Vitamin (MULTIVITAMIN) tablet Take 1 tablet by mouth daily.   Omega 3-6-9 Fatty Acids (OMEGA 3-6-9 COMPLEX PO) Take by mouth.   pantoprazole (PROTONIX) 40 MG tablet TAKE 1 TABLET BY MOUTH EVERY DAY   Red Yeast Rice Extract (RED YEAST RICE PO) Take 240 mg by mouth 2 (two) times daily.   thiamine (VITAMIN B-1) 100 MG tablet Take 100 mg by mouth daily.   TURMERIC PO Take 1 tablet by mouth daily. Takes a 1000 mg   UNABLE TO FIND Beet root   vitamin E 400 UNIT capsule Take  400 Units by mouth daily.   No facility-administered encounter medications on file as of 03/21/2021.    Allergies (verified) Lisinopril   History: Past Medical History:  Diagnosis Date   Basal cell carcinoma (BCC) of buttock    GERD (gastroesophageal reflux disease)    Hypertension    OSTEOARTHRITIS 09/17/2008   Osteopenia 11/2018   T score -1.7 distal third of radius FRAX 8.9% / 1.1%   Past Surgical History:  Procedure Laterality  Date   BACK SURGERY     LOWER   KNEE ARTHROSCOPY     left   PARTIAL KNEE ARTHROPLASTY Right 04/19/2015   Procedure: UNICOMPARTMENTAL KNEE MEDIAL REPLACMENT;  Surgeon: Gaynelle Arabian, MD;  Location: [facility];  Service: Orthopedics;  Laterality: Right;   TONSILLECTOMY     as child   TOTAL KNEE ARTHROPLASTY  04/03/2011   Procedure: TOTAL KNEE ARTHROPLASTY;  Surgeon: Gearlean Alf, MD;  Location: [facility];  Service: Orthopedics;  Laterality: Left;   TRIGGER FINGER RELEASE Left    Family History  Problem Relation Age of Onset   Pancreatic cancer Mother    Other Father        suicide   High blood pressure Brother    Breast cancer Maternal Grandmother        Age 1   Social History   Socioeconomic History   Marital status: Married    Spouse name: Not on file   Number of children: Not on file   Years of education: Not on file   Highest education level: Not on file  Occupational History   Not on file  Tobacco Use   Smoking status: Never    Passive exposure: Never   Smokeless tobacco: Never  Vaping Use   Vaping Use: Never used  Substance and Sexual Activity   Alcohol use: Not Currently    Comment: occassionally-social   Drug use: No   Sexual activity: Not Currently    Birth control/protection: Post-menopausal    Comment: 1st intercourse 76 yo-Fewer than 5 partners  Other Topics Concern   Not on file  Social History Narrative   Not on file   Social Determinants of Health   Financial Resource Strain: Low Risk    Difficulty of Paying Living Expenses: Not hard at all  Food Insecurity: No Food Insecurity   Worried About Charity fundraiser in the Last Year: Never true   [place] in the Last Year: Never true  Transportation Needs: No Transportation Needs   Lack of Transportation (Medical): No   Lack of Transportation (Non-Medical): No  Physical Activity: Sufficiently Active   Days of Exercise per Week: 5 days   Minutes of Exercise per Session: 40 min  Stress: No  Stress Concern Present   Feeling of Stress : Not at all  Social Connections: Moderately Isolated   Frequency of Communication with Friends and Family: More than three times a week   Frequency of Social Gatherings with Friends and Family: Twice a week   Attends Religious Services: Never   Marine scientist or Organizations: No   Attends Music therapist: Never   Marital Status: Married    Tobacco Counseling Counseling given: Not Answered   Clinical Intake:  Pre-visit preparation completed: Yes  Pain : No/denies pain     Nutritional Status: BMI > 30  Obese Nutritional Risks: None Diabetes: No  How often do you need to have someone help you when you read instructions, pamphlets, or  other written materials from your doctor or pharmacy?: 1 - Never What is the last grade level you completed in school?: 12th grade  Diabetic? no  Interpreter Needed?: No  Information entered by :: NAllen LPN   Activities of Daily Living In your present state of health, do you have any difficulty performing the following activities: 03/21/2021 03/15/2021  Hearing? N N  Comment - -  Vision? N N  Difficulty concentrating or making decisions? N N  Walking or climbing stairs? N N  Dressing or bathing? N N  Doing errands, shopping? N N  Preparing Food and eating ? N N  Using the Toilet? N N  In the past six months, have you accidently leaked urine? N N  Do you have problems with loss of bowel control? N N  Managing your Medications? N N  Managing your Finances? N N  Housekeeping or managing your Housekeeping? N N  Some recent data might be hidden    Patient Care Team: Caren Macadam, MD as PCP - General (Family Medicine) Dermatology, Presley Raddle, Leory Plowman, MD as Consulting Physician (Ophthalmology)  Indicate any recent Medical Services you may have received from other than [facility] providers in the past year (date may be approximate).     Assessment:   This is a  routine wellness examination for [place].  Hearing/Vision screen Vision Screening - Comments:: Regular eye exams, [facility]  Dietary issues and exercise activities discussed: Current Exercise Habits: Home exercise routine, Type of exercise: walking, Time (Minutes): 40, Frequency (Times/Week): 5, Weekly Exercise (Minutes/Week): 200   Goals Addressed             This Visit's Progress    Patient Stated       03/21/2021, no goals       Depression Screen PHQ 2/9 Scores 03/21/2021 01/07/2021 04/13/2020 12/31/2019 10/16/2018 12/20/2015 11/17/2014  PHQ - 2 Score 0 0 0 0 0 0 0  PHQ- 9 Score - 0 - - - - -    Fall Risk Fall Risk  03/21/2021 03/15/2021 01/07/2021 04/13/2020 12/31/2019  Falls in the past year? 0 0 0 0 0  Number falls in past yr: - - 0 0 0  Injury with Fall? - 0 - 0 -  Risk for fall due to : Medication side effect - - No Fall Risks -  Follow up Falls evaluation completed;Education provided;Falls prevention discussed - - Falls evaluation completed;Falls prevention discussed -    FALL RISK PREVENTION PERTAINING TO THE HOME:  Any stairs in or around the home? No  If so, are there any without handrails?  N/a Home free of loose throw rugs in walkways, pet beds, electrical cords, etc? Yes  Adequate lighting in your home to reduce risk of falls? Yes   ASSISTIVE DEVICES UTILIZED TO PREVENT FALLS:  Life alert? No  Use of a cane, walker or w/c? No  Grab bars in the bathroom? No  Shower chair or bench in shower? No  Elevated toilet seat or a handicapped toilet? No   TIMED UP AND GO:  Was the test performed? No .      Cognitive Function:     6CIT Screen 03/21/2021  What Year? 0 points  What month? 0 points  What time? 0 points  Count back from 20 0 points  Months in reverse 0 points  Repeat phrase 0 points  Total Score 0    Immunizations Immunization History  Administered Date(s) Administered   Influenza, High Dose Seasonal PF  12/05/2013, 11/17/2014, 12/20/2015,  12/21/2016, 12/06/2017   Influenza,inj,Quad PF,6+ Mos 11/11/2018   Influenza-Unspecified 11/11/2019, 11/18/2020   PFIZER(Purple Top)SARS-COV-2 Vaccination 03/06/2019, 04/03/2019, 11/11/2019, 05/11/2020   PNEUMOCOCCAL CONJUGATE-20 01/07/2021   Pfizer Covid-19 Vaccine Bivalent Booster 52yrs & up 11/18/2020   Pneumococcal Conjugate-13 10/06/2013   Pneumococcal Polysaccharide-23 10/03/2012   Tdap 09/30/2010   Zoster Recombinat (Shingrix) 07/16/2017, 10/24/2017    TDAP status: Due, Education has been provided regarding the importance of this vaccine. Advised may receive this vaccine at local pharmacy or Health Dept. Aware to provide a copy of the vaccination record if obtained from local pharmacy or Health Dept. Verbalized acceptance and understanding.  Flu Vaccine status: Up to date  Pneumococcal vaccine status: Up to date  Covid-19 vaccine status: Completed vaccines  Qualifies for Shingles Vaccine? Yes   Zostavax completed No   Shingrix Completed?: Yes  Screening Tests Health Maintenance  Topic Date Due   TETANUS/TDAP  04/07/2021 (Originally 09/29/2020)   MAMMOGRAM  07/29/2021   COLONOSCOPY (Pts 45-52yrs Insurance coverage will need to be confirmed)  01/09/2022   Pneumonia Vaccine 82+ Years old  Completed   INFLUENZA VACCINE  Completed   DEXA SCAN  Completed   COVID-19 Vaccine  Completed   Hepatitis C Screening  Completed   Zoster Vaccines- Shingrix  Completed   HPV VACCINES  Aged Out    Health Maintenance  There are no preventive care reminders to display for this patient.  Colorectal cancer screening: Type of screening: Colonoscopy. Completed 01/09/2017. Repeat every 5 years  Mammogram status: Completed 07/29/2020. Repeat every year  Bone Density status: Completed 12/03/2018.   Lung Cancer Screening: (Low Dose CT Chest recommended if Age 30-80 years, 30 pack-year currently smoking OR have quit w/in 15years.) does not qualify.   Lung Cancer Screening Referral:  no  Additional Screening:  Hepatitis C Screening: does qualify; Completed 12/21/2016  Vision Screening: Recommended annual ophthalmology exams for early detection of glaucoma and other disorders of the eye. Is the patient up to date with their annual eye exam?  Yes  Who is the provider or what is the name of the office in which the patient attends annual eye exams? [facility] If pt is not established with a provider, would they like to be referred to a provider to establish care? No .   Dental Screening: Recommended annual dental exams for proper oral hygiene  Community Resource Referral / Chronic Care Management: CRR required this visit?  No   CCM required this visit?  No      Plan:     I have personally reviewed and noted the following in the patients chart:   Medical and social history Use of alcohol, tobacco or illicit drugs  Current medications and supplements including opioid prescriptions.  Functional ability and status Nutritional status Physical activity Advanced directives List of other physicians Hospitalizations, surgeries, and ER visits in previous 12 months Vitals Screenings to include cognitive, depression, and falls Referrals and appointments  In addition, I have reviewed and discussed with patient certain preventive protocols, quality metrics, and best practice recommendations. A written personalized care plan for preventive services as well as general preventive health recommendations were provided to patient.     Kellie Simmering, LPN   1/94/1740   Nurse Notes: none  Due to this being a virtual visit, the after visit summary with patients personalized plan was offered to patient via mail or my-chart. Patient would like to access on my-chart

## 2021-03-23 ENCOUNTER — Other Ambulatory Visit: Payer: Self-pay | Admitting: Family Medicine

## 2021-03-23 DIAGNOSIS — K219 Gastro-esophageal reflux disease without esophagitis: Secondary | ICD-10-CM

## 2021-04-07 ENCOUNTER — Other Ambulatory Visit: Payer: Self-pay | Admitting: Family Medicine

## 2021-04-07 DIAGNOSIS — I10 Essential (primary) hypertension: Secondary | ICD-10-CM

## 2021-07-03 ENCOUNTER — Other Ambulatory Visit: Payer: Self-pay | Admitting: Family Medicine

## 2021-07-03 DIAGNOSIS — I10 Essential (primary) hypertension: Secondary | ICD-10-CM

## 2021-08-04 DIAGNOSIS — Z1231 Encounter for screening mammogram for malignant neoplasm of breast: Secondary | ICD-10-CM | POA: Diagnosis not present

## 2021-08-05 ENCOUNTER — Encounter: Payer: Self-pay | Admitting: Obstetrics & Gynecology

## 2021-08-22 ENCOUNTER — Other Ambulatory Visit: Payer: Self-pay | Admitting: *Deleted

## 2021-08-22 DIAGNOSIS — I10 Essential (primary) hypertension: Secondary | ICD-10-CM

## 2021-08-22 MED ORDER — AMLODIPINE BESYLATE 2.5 MG PO TABS: 2.5000 mg | ORAL_TABLET | Freq: Every day | ORAL | 0 refills | Status: DC

## 2021-09-15 ENCOUNTER — Other Ambulatory Visit: Payer: Self-pay | Admitting: *Deleted

## 2021-09-15 DIAGNOSIS — K219 Gastro-esophageal reflux disease without esophagitis: Secondary | ICD-10-CM

## 2021-09-18 MED ORDER — PANTOPRAZOLE SODIUM 40 MG PO TBEC: 40.0000 mg | DELAYED_RELEASE_TABLET | Freq: Every day | ORAL | 0 refills | Status: DC

## 2021-09-18 NOTE — Telephone Encounter (Signed)
Ok to refill but she needs an appt. With me. Thanks!

## 2021-10-01 ENCOUNTER — Other Ambulatory Visit: Payer: Self-pay | Admitting: Family

## 2021-10-01 DIAGNOSIS — I10 Essential (primary) hypertension: Secondary | ICD-10-CM

## 2021-10-05 ENCOUNTER — Telehealth: Payer: Self-pay | Admitting: Family Medicine

## 2021-10-05 DIAGNOSIS — I10 Essential (primary) hypertension: Secondary | ICD-10-CM

## 2021-10-05 NOTE — Telephone Encounter (Signed)
Pt called to say she will run out of the  losartan-hydrochlorothiazide (HYZAAR) 100-25 MG tablet  soon.  Pt has been scheduled for a TOC on 11/02/21.  Pt is going out of town and is afraid she may run out before she can see MD.  Pt is asking if MD would consider renewing this Rx before the TOC.    Please advise.  [id number]- [place], [place] - [address] Phone:  [phone] Fax:  [phone]

## 2021-10-06 NOTE — Telephone Encounter (Signed)
Pt is aware refill can take up to 3 business days

## 2021-10-06 NOTE — Telephone Encounter (Signed)
Ok to refill 

## 2021-10-11 MED ORDER — LOSARTAN POTASSIUM-HCTZ 100-25 MG PO TABS: 1.0000 | ORAL_TABLET | Freq: Every day | ORAL | 0 refills | Status: DC

## 2021-10-11 NOTE — Telephone Encounter (Signed)
Rx done. 

## 2021-10-11 NOTE — Telephone Encounter (Signed)
Pt callling in checking on refill that she called in about last week. Patient is headed out of town tomorrow and hopes to get this refill asap

## 2021-10-11 NOTE — Addendum Note (Signed)
Addended by: Agnes Lawrence on: 10/11/2021 03:28 PM   Modules accepted: Orders

## 2021-11-02 ENCOUNTER — Encounter: Payer: Self-pay | Admitting: Family Medicine

## 2021-11-02 ENCOUNTER — Ambulatory Visit (INDEPENDENT_AMBULATORY_CARE_PROVIDER_SITE_OTHER): Payer: Medicare HMO | Admitting: Family Medicine

## 2021-11-02 VITALS — BP 124/82 | HR 77 | Temp 97.8°F | Ht <= 58 in | Wt 166.0 lb

## 2021-11-02 DIAGNOSIS — E782 Mixed hyperlipidemia: Secondary | ICD-10-CM | POA: Diagnosis not present

## 2021-11-02 DIAGNOSIS — R7309 Other abnormal glucose: Secondary | ICD-10-CM

## 2021-11-02 DIAGNOSIS — Z1211 Encounter for screening for malignant neoplasm of colon: Secondary | ICD-10-CM

## 2021-11-02 DIAGNOSIS — I10 Essential (primary) hypertension: Secondary | ICD-10-CM | POA: Diagnosis not present

## 2021-11-02 MED ORDER — LOSARTAN POTASSIUM-HCTZ 100-25 MG PO TABS: 1.0000 | ORAL_TABLET | Freq: Every day | ORAL | 2 refills | Status: DC

## 2021-11-02 MED ORDER — AMLODIPINE BESYLATE 2.5 MG PO TABS: 2.5000 mg | ORAL_TABLET | Freq: Every day | ORAL | 3 refills | Status: DC

## 2021-11-02 NOTE — Assessment & Plan Note (Signed)
Lipid panel reviewed, labs are good except for an elevated LDL, she is on omega 3 supplements and red yeast rice, will recheck at her next visit.

## 2021-11-02 NOTE — Assessment & Plan Note (Signed)
Current hypertension medications:      Sig   amLODipine (NORVASC) 2.5 MG tablet Take 1 tablet (2.5 mg total) by mouth daily.   losartan-hydrochlorothiazide (HYZAAR) 100-25 MG tablet Take 1 tablet by mouth daily.     Well controlled today on these medications, she needs refills, will order her bloodwork for her next visit.

## 2021-11-02 NOTE — Progress Notes (Signed)
Established Patient Office Visit  Subjective   Patient ID: Robin Francis, female    DOB: 24-Jun-1945  Age: 76 y.o. MRN: [id number]  Chief Complaint  Patient presents with   Establish Care    Patient is here transition of care visit.   HTN -- BP in office performed and is well controlled. She reports no side effects to the medications, no chest pain, SOB, dizziness or headaches. She has a BP cuff at home and is checking her BP regularly, reports they are in the normal range.  States she takes one in the morning and one at night, no ankle swelling. States she engages in physical activity every day, usually walking but also goes to the gym.  HLD-- Reviewed last lipid panel, she is on omega 3 fatty acid supplements and red yeast rice. She reports she is doing well on these supplements. We discussed her 10 year CVD risk. Will recheck lipid panel in 3 months.   Impaired fasting glucose-- pt's last A1C was 5.6 which was WNL. She reports she is exercising regularly, states she takes the cinnamon supplement to help reduce sugar. Will recheck A1C at her next visit. No polyuria, polydipsia or neuropathy sx today.has not gained any weight since her last visit.   We reviewed her health maintenance measures, she is UTD on her mammogram, will be due for new colonoscopy in December, referral placed. States she has an appt in October with her GYN.   Current Outpatient Medications  Medication Instructions   amLODipine (NORVASC) 2.5 mg, Oral, Daily   calcium carbonate (OS-CAL) 600 mg, Oral, 2 times daily with meals   cetirizine (ZYRTEC) 10 mg, Daily   CINNAMON PO 1,000 mg, Oral, Daily   Coenzyme Q10 (CO Q 10 PO) 120 mg, Oral   losartan-hydrochlorothiazide (HYZAAR) 100-25 MG tablet 1 tablet, Oral, Daily   Multiple Vitamin (MULTIVITAMIN) tablet 1 tablet, Oral, Daily   Omega 3-6-9 Fatty Acids (OMEGA 3-6-9 COMPLEX PO) Oral   pantoprazole (PROTONIX) 40 mg, Oral, Daily   Red Yeast Rice Extract (RED YEAST  RICE PO) 240 mg, Oral, 2 times daily   thiamine (VITAMIN B-1) 100 mg, Oral, Daily   TURMERIC PO 1 tablet, Oral, Daily, Takes a 1000 mg   UNABLE TO FIND Beet root   vitamin C 1,000 mg, Oral, Daily   Vitamin D 2,000 Units, Oral, Daily   vitamin E 400 Units, Oral, Daily    Patient Active Problem List   Diagnosis Date Noted   Elevated glucose 07/10/2018   Hyperlipidemia 07/10/2018   Stricture and stenosis of esophagus 01/06/2013   Special screening for malignant neoplasms, colon 11/07/2012   Essential hypertension, benign 04/04/2012   Low bone mass    Postop Acute blood loss anemia 04/13/2011   OA (osteoarthritis) of knee 04/03/2011   MENOPAUSAL SYNDROME 09/17/2008   Osteoarthritis 09/17/2008      Review of Systems  All other systems reviewed and are negative.     Objective:     BP 124/82 (BP Location: Left Arm, Patient Position: Sitting, Cuff Size: Normal)   Pulse 77   Temp 97.8 F (36.6 C) (Oral)   Ht '4\' 9"'$  (1.448 m)   Wt 166 lb (75.3 kg)   SpO2 98%   BMI 35.92 kg/m  BP Readings from Last 3 Encounters:  11/02/21 124/82  01/07/21 130/80  11/23/20 110/74      Physical Exam Vitals reviewed.  Constitutional:      Appearance: Normal appearance. She is well-groomed. She  is obese.  Eyes:     Conjunctiva/sclera: Conjunctivae normal.  Neck:     Thyroid: No thyromegaly.  Cardiovascular:     Rate and Rhythm: Normal rate and regular rhythm.     Heart sounds: S1 normal and S2 normal.  Pulmonary:     Effort: Pulmonary effort is normal.     Breath sounds: Normal breath sounds and air entry.  Abdominal:     General: Bowel sounds are normal.  Musculoskeletal:        General: Normal range of motion.     Right lower leg: No edema.     Left lower leg: No edema.  Skin:    General: Skin is warm and dry.  Neurological:     Mental Status: She is alert and oriented to person, place, and time. Mental status is at baseline.     Gait: Gait is intact.  Psychiatric:         Mood and Affect: Mood and affect normal.        Speech: Speech normal.        Behavior: Behavior normal.        Judgment: Judgment normal.      No results found for any visits on 11/02/21.  Last lipids Lab Results  Component Value Date   CHOL 235 (H) 01/07/2021   HDL 68.60 01/07/2021   LDLCALC 147 (H) 01/07/2021   LDLDIRECT 136.8 09/26/2012   TRIG 98.0 01/07/2021   CHOLHDL 3 01/07/2021   Last hemoglobin A1c Lab Results  Component Value Date   HGBA1C 5.6 01/07/2021      The 10-year ASCVD risk score (Arnett DK, et al., 2019) is: 18.2%    Assessment & Plan:   Problem List Items Addressed This Visit       Cardiovascular and Mediastinum   Essential hypertension, benign - Primary    Current hypertension medications:       Sig   amLODipine (NORVASC) 2.5 MG tablet Take 1 tablet (2.5 mg total) by mouth daily.   losartan-hydrochlorothiazide (HYZAAR) 100-25 MG tablet Take 1 tablet by mouth daily.  Well controlled today on these medications, she needs refills, will order her bloodwork for her next visit.      Relevant Medications   amLODipine (NORVASC) 2.5 MG tablet   losartan-hydrochlorothiazide (HYZAAR) 100-25 MG tablet   Other Relevant Orders   CMP     Other   Elevated glucose    Pt is watching her diet, exercising regularly, well controlled, will recheck A1C with her next set of labs.      Relevant Orders   Hemoglobin A1c   Hyperlipidemia    Lipid panel reviewed, labs are good except for an elevated LDL, she is on omega 3 supplements and red yeast rice, will recheck at her next visit.      Relevant Medications   amLODipine (NORVASC) 2.5 MG tablet   losartan-hydrochlorothiazide (HYZAAR) 100-25 MG tablet   Other Relevant Orders   Lipid Panel   Other Visit Diagnoses     Colon cancer screening       Relevant Orders   Ambulatory referral to Gastroenterology       Return in about 3 months (around 02/01/2022) for Annual Physical.    Farrel Conners,  MD

## 2021-11-02 NOTE — Assessment & Plan Note (Signed)
Pt is watching her diet, exercising regularly, well controlled, will recheck A1C with her next set of labs.

## 2021-11-03 ENCOUNTER — Encounter: Payer: Self-pay | Admitting: Internal Medicine

## 2021-11-19 ENCOUNTER — Other Ambulatory Visit: Payer: Self-pay | Admitting: Family Medicine

## 2021-11-19 DIAGNOSIS — K219 Gastro-esophageal reflux disease without esophagitis: Secondary | ICD-10-CM

## 2021-11-21 MED ORDER — PANTOPRAZOLE SODIUM 40 MG PO TBEC: 40.0000 mg | DELAYED_RELEASE_TABLET | Freq: Every day | ORAL | 0 refills | Status: DC

## 2021-11-24 ENCOUNTER — Ambulatory Visit (INDEPENDENT_AMBULATORY_CARE_PROVIDER_SITE_OTHER): Payer: Medicare HMO | Admitting: Obstetrics & Gynecology

## 2021-11-24 ENCOUNTER — Encounter: Payer: Self-pay | Admitting: Obstetrics & Gynecology

## 2021-11-24 ENCOUNTER — Other Ambulatory Visit (HOSPITAL_COMMUNITY)
Admission: RE | Admit: 2021-11-24 | Discharge: 2021-11-24 | Disposition: A | Discharge status: Home / Self Care | Payer: Medicare HMO | Source: Ambulatory Visit | Attending: Obstetrics & Gynecology | Admitting: Obstetrics & Gynecology

## 2021-11-24 VITALS — BP 120/82 | HR 65 | Ht <= 58 in | Wt 165.0 lb

## 2021-11-24 DIAGNOSIS — Z780 Asymptomatic menopausal state: Secondary | ICD-10-CM | POA: Diagnosis not present

## 2021-11-24 DIAGNOSIS — Z01419 Encounter for gynecological examination (general) (routine) without abnormal findings: Secondary | ICD-10-CM

## 2021-11-24 DIAGNOSIS — M8580 Other specified disorders of bone density and structure, unspecified site: Secondary | ICD-10-CM | POA: Diagnosis not present

## 2021-11-24 DIAGNOSIS — Z9189 Other specified personal risk factors, not elsewhere classified: Secondary | ICD-10-CM | POA: Diagnosis not present

## 2021-11-24 NOTE — Progress Notes (Signed)
Robin Francis Jan 19, 1946 [id number]   History:    76 y.o.  G2P2L2  Married.  4 grand-children.   RP:  Established patient presenting for annual gyn exam and management of Osteopenia    HPI: Postmenopausal, well on no HRT.  No PMB.  No pelvic pain. Pap smear 11/2016.  No significant history of abnormal Pap smears.  Pap reflex today.  Mammogram 07/2021 Negative. Colonoscopy 2018, repeat Colono scheduled in 01/2022. Osteopenia. DEXA 11/2018, T score -1.7 at Lt 1/3 Forearm, FRAX 8.9% / 1.1%.  Repeat BD here now. BMI 36.02.  Health labs with Fam MD.    Past medical history,surgical history, family history and social history were all reviewed and documented in the EPIC chart.  Gynecologic History No LMP recorded. Patient is postmenopausal.  Obstetric History OB History  Gravida Para Term Preterm AB Living  '2 2 2     2  '$ SAB IAB Ectopic Multiple Live Births               # Outcome Date GA Lbr Len/2nd Weight Sex Delivery Anes PTL Lv  2 Term           1 Term             ROS: A ROS was performed and pertinent positives and negatives are included in the history. GENERAL: No fevers or chills. HEENT: No change in vision, no earache, sore throat or sinus congestion. NECK: No pain or stiffness. CARDIOVASCULAR: No chest pain or pressure. No palpitations. PULMONARY: No shortness of breath, cough or wheeze. GASTROINTESTINAL: No abdominal pain, nausea, vomiting or diarrhea, melena or bright red blood per rectum. GENITOURINARY: No urinary frequency, urgency, hesitancy or dysuria. MUSCULOSKELETAL: No joint or muscle pain, no back pain, no recent trauma. DERMATOLOGIC: No rash, no itching, no lesions. ENDOCRINE: No polyuria, polydipsia, no heat or cold intolerance. No recent change in weight. HEMATOLOGICAL: No anemia or easy bruising or bleeding. NEUROLOGIC: No headache, seizures, numbness, tingling or weakness. PSYCHIATRIC: No depression, no loss of interest in normal activity or change in sleep pattern.      Exam:   BP 120/82   Pulse 65   Ht 4' 8.75" (1.441 m)   Wt 165 lb (74.8 kg)   SpO2 99%   BMI 36.02 kg/m   Body mass index is 36.02 kg/m.  General appearance : Well developed well nourished female. No acute distress HEENT: Eyes: no retinal hemorrhage or exudates,  Neck supple, trachea midline, no carotid bruits, no thyroidmegaly Lungs: Clear to auscultation, no rhonchi or wheezes, or rib retractions  Heart: Regular rate and rhythm, no murmurs or gallops Breast:Examined in sitting and supine position were symmetrical in appearance, no palpable masses or tenderness,  no skin retraction, no nipple inversion, no nipple discharge, no skin discoloration, no axillary or supraclavicular lymphadenopathy Abdomen: no palpable masses or tenderness, no rebound or guarding Extremities: no edema or skin discoloration or tenderness  Pelvic: Vulva: Normal             Vagina: No gross lesions or discharge  Cervix: No gross lesions or discharge.  Pap reflex done.  Uterus  AV, normal size, shape and consistency, non-tender and mobile  Adnexa  Without masses or tenderness  Anus: Normal   Assessment/Plan:  76 y.o. female for annual exam   1. Encounter for routine gynecological examination with Papanicolaou smear of cervix Postmenopausal, well on no HRT.  No PMB.  No pelvic pain. Pap smear 11/2016.  No significant history  of abnormal Pap smears.  Pap reflex today.  Mammogram 07/2021 Negative. Colonoscopy 2018, repeat Colono scheduled in 01/2022. Osteopenia. DEXA 11/2018, T score -1.7 at Lt 1/3 Forearm, FRAX 8.9% / 1.1%.  Repeat BD here now. BMI 36.02.  Health labs with Fam MD.  - Cytology - PAP( [place])  2. Postmenopause Postmenopausal, well on no HRT.  No PMB.  No pelvic pain. - DG Bone Density; Future  3. Osteopenia, unspecified location Osteopenia. DEXA 11/2018, T score -1.7 at Lt 1/3 Forearm, FRAX 8.9% / 1.1%.  Repeat BD here now.  Ca++ 1.5 g/d total, Vit D, Vit K2. - DG Bone Density;  Future  4. Other specified personal risk factors, not elsewhere classified   Princess Bruins MD, 9:33 AM 11/24/2021

## 2021-11-30 LAB — CYTOLOGY - PAP: Diagnosis: NEGATIVE

## 2021-12-13 ENCOUNTER — Other Ambulatory Visit: Payer: Self-pay | Admitting: Obstetrics & Gynecology

## 2021-12-13 ENCOUNTER — Ambulatory Visit (INDEPENDENT_AMBULATORY_CARE_PROVIDER_SITE_OTHER): Payer: Medicare HMO

## 2021-12-13 DIAGNOSIS — Z780 Asymptomatic menopausal state: Secondary | ICD-10-CM | POA: Diagnosis not present

## 2021-12-13 DIAGNOSIS — Z9189 Other specified personal risk factors, not elsewhere classified: Secondary | ICD-10-CM

## 2021-12-13 DIAGNOSIS — Z13820 Encounter for screening for osteoporosis: Secondary | ICD-10-CM | POA: Diagnosis not present

## 2021-12-13 DIAGNOSIS — M8589 Other specified disorders of bone density and structure, multiple sites: Secondary | ICD-10-CM

## 2021-12-13 DIAGNOSIS — Z01419 Encounter for gynecological examination (general) (routine) without abnormal findings: Secondary | ICD-10-CM

## 2021-12-13 DIAGNOSIS — M8580 Other specified disorders of bone density and structure, unspecified site: Secondary | ICD-10-CM

## 2021-12-15 DIAGNOSIS — L905 Scar conditions and fibrosis of skin: Secondary | ICD-10-CM | POA: Diagnosis not present

## 2021-12-15 DIAGNOSIS — L814 Other melanin hyperpigmentation: Secondary | ICD-10-CM | POA: Diagnosis not present

## 2021-12-15 DIAGNOSIS — Z85828 Personal history of other malignant neoplasm of skin: Secondary | ICD-10-CM | POA: Diagnosis not present

## 2021-12-15 DIAGNOSIS — D2239 Melanocytic nevi of other parts of face: Secondary | ICD-10-CM | POA: Diagnosis not present

## 2021-12-15 DIAGNOSIS — L821 Other seborrheic keratosis: Secondary | ICD-10-CM | POA: Diagnosis not present

## 2021-12-15 DIAGNOSIS — L820 Inflamed seborrheic keratosis: Secondary | ICD-10-CM | POA: Diagnosis not present

## 2021-12-15 DIAGNOSIS — L570 Actinic keratosis: Secondary | ICD-10-CM | POA: Diagnosis not present

## 2021-12-15 DIAGNOSIS — D485 Neoplasm of uncertain behavior of skin: Secondary | ICD-10-CM | POA: Diagnosis not present

## 2021-12-19 ENCOUNTER — Ambulatory Visit (AMBULATORY_SURGERY_CENTER): Payer: Medicare HMO

## 2021-12-19 VITALS — Ht <= 58 in | Wt 166.2 lb

## 2021-12-19 DIAGNOSIS — Z86010 Personal history of colonic polyps: Secondary | ICD-10-CM

## 2021-12-19 MED ORDER — NA SULFATE-K SULFATE-MG SULF 17.5-3.13-1.6 GM/177ML PO SOLN: 1.0000 | Freq: Once | ORAL | 0 refills | Status: AC

## 2022-01-03 DIAGNOSIS — H524 Presbyopia: Secondary | ICD-10-CM | POA: Diagnosis not present

## 2022-01-03 DIAGNOSIS — H11003 Unspecified pterygium of eye, bilateral: Secondary | ICD-10-CM | POA: Diagnosis not present

## 2022-01-04 ENCOUNTER — Encounter: Payer: Self-pay | Admitting: Internal Medicine

## 2022-01-09 ENCOUNTER — Ambulatory Visit (AMBULATORY_SURGERY_CENTER): Payer: Medicare HMO | Admitting: Internal Medicine

## 2022-01-09 ENCOUNTER — Encounter: Payer: Self-pay | Admitting: Internal Medicine

## 2022-01-09 VITALS — BP 144/76 | HR 65 | Temp 97.1°F | Resp 16 | Ht <= 58 in | Wt 166.2 lb

## 2022-01-09 DIAGNOSIS — Z86010 Personal history of colonic polyps: Secondary | ICD-10-CM | POA: Diagnosis not present

## 2022-01-09 DIAGNOSIS — Z09 Encounter for follow-up examination after completed treatment for conditions other than malignant neoplasm: Secondary | ICD-10-CM

## 2022-01-09 DIAGNOSIS — D122 Benign neoplasm of ascending colon: Secondary | ICD-10-CM

## 2022-01-09 MED ORDER — SODIUM CHLORIDE 0.9 % IV SOLN: 500.0000 mL | INTRAVENOUS | Status: DC

## 2022-01-09 NOTE — Progress Notes (Signed)
Called to room to assist during endoscopic procedure.  Patient ID and intended procedure confirmed with present staff. Received instructions for my participation in the procedure from the performing physician.  

## 2022-01-09 NOTE — Progress Notes (Signed)
Sedate, gd SR, tolerated procedure well, VSS, report to RN 

## 2022-01-09 NOTE — Patient Instructions (Addendum)
   Handouts on polyps & diverticulosis given to you today.  Await pathology results    YOU HAD AN ENDOSCOPIC PROCEDURE TODAY AT [facility]:   Refer to the procedure report that was given to you for any specific questions about what was found during the examination.  If the procedure report does not answer your questions, please call your gastroenterologist to clarify.  If you requested that your care partner not be given the details of your procedure findings, then the procedure report has been included in a sealed envelope for you to review at your convenience later.  YOU SHOULD EXPECT: Some feelings of bloating in the abdomen. Passage of more gas than usual.  Walking can help get rid of the air that was put into your GI tract during the procedure and reduce the bloating. If you had a lower endoscopy (such as a colonoscopy or flexible sigmoidoscopy) you may notice spotting of blood in your stool or on the toilet paper. If you underwent a bowel prep for your procedure, you may not have a normal bowel movement for a few days.  Please Note:  You might notice some irritation and congestion in your nose or some drainage.  This is from the oxygen used during your procedure.  There is no need for concern and it should clear up in a day or so.  SYMPTOMS TO REPORT IMMEDIATELY:  Following lower endoscopy (colonoscopy or flexible sigmoidoscopy):  Excessive amounts of blood in the stool  Significant tenderness or worsening of abdominal pains  Swelling of the abdomen that is new, acute  Fever of 100F or higher   For urgent or emergent issues, a gastroenterologist can be reached at any hour by calling [phone]. Do not use MyChart messaging for urgent concerns.    DIET:  We do recommend a small meal at first, but then you may proceed to your regular diet.  Drink plenty of fluids but you should avoid alcoholic beverages for 24 hours.  ACTIVITY:  You should plan to take it easy  for the rest of today and you should NOT DRIVE or use heavy machinery until tomorrow (because of the sedation medicines used during the test).    FOLLOW UP: Our staff will call the number listed on your records the next business day following your procedure.  We will call around 7:15- 8:00 am to check on you and address any questions or concerns that you may have regarding the information given to you following your procedure. If we do not reach you, we will leave a message.     If any biopsies were taken you will be contacted by phone or by letter within the next 1-3 weeks.  Please call us at [phone] if you have not heard about the biopsies in 3 weeks.    SIGNATURES/CONFIDENTIALITY: You and/or your care partner have signed paperwork which will be entered into your electronic medical record.  These signatures attest to the fact that that the information above on your After Visit Summary has been reviewed and is understood.  Full responsibility of the confidentiality of this discharge information lies with you and/or your care-partner.

## 2022-01-09 NOTE — Op Note (Signed)
[place] Patient Name: Robin Francis Procedure Date: 01/09/2022 7:58 AM MRN: [id number] Endoscopist: Docia Chuck. Henrene Pastor , MD, [id number] Age: 76 Referring MD:  Date of Birth: [birth year] Gender: Female Account #: [id number] Procedure:                Colonoscopy with cold snare polypectomy x 1 Indications:              High risk colon cancer surveillance: Personal                            history of sessile serrated colon polyp (less than                            10 mm in size) with no dysplasia. Previous                            examinations 2008 (negative); 2018 Medicines:                Monitored Anesthesia Care Procedure:                Pre-Anesthesia Assessment:                           - Prior to the procedure, a History and Physical                            was performed, and patient medications and                            allergies were reviewed. The patient's tolerance of                            previous anesthesia was also reviewed. The risks                            and benefits of the procedure and the sedation                            options and risks were discussed with the patient.                            All questions were answered, and informed consent                            was obtained. Prior Anticoagulants: The patient has                            taken no anticoagulant or antiplatelet agents. ASA                            Grade Assessment: II - A patient with mild systemic                            disease. After reviewing the risks and benefits,  the patient was deemed in satisfactory condition to                            undergo the procedure.                           After obtaining informed consent, the colonoscope                            was passed under direct vision. Throughout the                            procedure, the patient's blood pressure, pulse, and                             oxygen saturations were monitored continuously. The                            Olympus Scope [id number] was introduced through the                            anus and advanced to the the cecum, identified by                            appendiceal orifice and ileocecal valve. The                            ileocecal valve, appendiceal orifice, and rectum                            were photographed. The quality of the bowel                            preparation was excellent. The colonoscopy was                            performed without difficulty. The patient tolerated                            the procedure well. The bowel preparation used was                            SUPREP via split dose instruction. Scope In: 8:18:29 AM Scope Out: 8:26:52 AM Scope Withdrawal Time: 0 hours 7 minutes 18 seconds  Total Procedure Duration: 0 hours 8 minutes 23 seconds  Findings:                 A 1 mm polyp was found in the ascending colon. The                            polyp was removed with a cold snare. Resection and                            retrieval were complete.  Multiple diverticula were found in the sigmoid                            colon.                           The exam was otherwise without abnormality on                            direct and retroflexion views. Complications:            No immediate complications. Estimated blood loss:                            None. Estimated Blood Loss:     Estimated blood loss: none. Impression:               - One 1 mm polyp in the ascending colon, removed                            with a cold snare. Resected and retrieved.                           - Diverticulosis in the sigmoid colon.                           - The examination was otherwise normal on direct                            and retroflexion views. Recommendation:           - Repeat colonoscopy is not recommended for                             surveillance.                           - Patient has a contact number available for                            emergencies. The signs and symptoms of potential                            delayed complications were discussed with the                            patient. Return to normal activities tomorrow.                            Written discharge instructions were provided to the                            patient.                           - Resume previous diet.                           -  Continue present medications.                           - Await pathology results. Docia Chuck. Henrene Pastor, MD 01/09/2022 8:34:33 AM This report has been signed electronically.

## 2022-01-09 NOTE — Progress Notes (Signed)
HISTORY OF PRESENT ILLNESS:  Robin Francis is a 76 y.o. female with history of SSP.  Now for follow-up colonoscopy  REVIEW OF SYSTEMS:  All non-GI ROS negative except for  Past Medical History:  Diagnosis Date   Basal cell carcinoma (BCC) of buttock    GERD (gastroesophageal reflux disease)    Hypertension    OSTEOARTHRITIS 09/17/2008   Osteopenia 11/2018   T score -1.7 distal third of radius FRAX 8.9% / 1.1%    Past Surgical History:  Procedure Laterality Date   BACK SURGERY     LOWER   KNEE ARTHROSCOPY     left   PARTIAL KNEE ARTHROPLASTY Right 04/19/2015   Procedure: UNICOMPARTMENTAL KNEE MEDIAL REPLACMENT;  Surgeon: Gaynelle Arabian, MD;  Location: [facility];  Service: Orthopedics;  Laterality: Right;   TONSILLECTOMY     as child   TOTAL KNEE ARTHROPLASTY  04/03/2011   Procedure: TOTAL KNEE ARTHROPLASTY;  Surgeon: Gearlean Alf, MD;  Location: [facility];  Service: Orthopedics;  Laterality: Left;   TRIGGER FINGER RELEASE Left     Social History Robin Francis  reports that she has never smoked. She has never been exposed to tobacco smoke. She has never used smokeless tobacco. She reports current alcohol use. She reports that she does not use drugs.  family history includes Breast cancer in her maternal grandmother; High blood pressure in her brother; Other in her father; Pancreatic cancer in her mother.  Allergies  Allergen Reactions   Lisinopril Other (See Comments)    Cough       PHYSICAL EXAMINATION: Vital signs: BP (!) 149/82   Pulse 70   Temp (!) 97.1 F (36.2 C)   Ht '4\' 8"'$  (1.422 m)   Wt 166 lb 3.2 oz (75.4 kg)   SpO2 98%   BMI 37.26 kg/m  General: Well-developed, well-nourished, no acute distress HEENT: Sclerae are anicteric, conjunctiva pink. Oral mucosa intact Lungs: Clear Heart: Regular Abdomen: soft, nontender, nondistended, no obvious ascites, no peritoneal signs, normal bowel sounds. No organomegaly. Extremities: No edema Psychiatric: alert and  oriented x3. Cooperative     ASSESSMENT:  Personal history of SSP   PLAN:  Surveillance colonoscopy

## 2022-01-09 NOTE — Progress Notes (Signed)
Pt's states no medical or surgical changes since previsit or office visit. 

## 2022-01-10 ENCOUNTER — Telehealth: Payer: Self-pay

## 2022-01-10 ENCOUNTER — Other Ambulatory Visit (INDEPENDENT_AMBULATORY_CARE_PROVIDER_SITE_OTHER): Payer: Medicare HMO

## 2022-01-10 DIAGNOSIS — E782 Mixed hyperlipidemia: Secondary | ICD-10-CM | POA: Diagnosis not present

## 2022-01-10 DIAGNOSIS — I10 Essential (primary) hypertension: Secondary | ICD-10-CM | POA: Diagnosis not present

## 2022-01-10 DIAGNOSIS — R7309 Other abnormal glucose: Secondary | ICD-10-CM

## 2022-01-10 LAB — COMPREHENSIVE METABOLIC PANEL
ALT: 23 U/L (ref 0–35)
AST: 24 U/L (ref 0–37)
Albumin: 4.2 g/dL (ref 3.5–5.2)
Alkaline Phosphatase: 69 U/L (ref 39–117)
BUN: 11 mg/dL (ref 6–23)
CO2: 31 mEq/L (ref 19–32)
Calcium: 9.4 mg/dL (ref 8.4–10.5)
Chloride: 101 mEq/L (ref 96–112)
Creatinine, Ser: 0.79 mg/dL (ref 0.40–1.20)
GFR: 73.54 mL/min (ref >60.00)
Glucose, Bld: 101 mg/dL — ABNORMAL HIGH (ref 70–99)
Potassium: 3.8 mEq/L (ref 3.5–5.1)
Sodium: 140 mEq/L (ref 135–145)
Total Bilirubin: 1 mg/dL (ref 0.2–1.2)
Total Protein: 6.9 g/dL (ref 6.0–8.3)

## 2022-01-10 LAB — LIPID PANEL
Cholesterol: 212 mg/dL — ABNORMAL HIGH (ref 0–200)
HDL: 63.3 mg/dL (ref >39.00)
LDL Cholesterol: 126 mg/dL — ABNORMAL HIGH (ref 0–99)
NonHDL: 148.43
Total CHOL/HDL Ratio: 3
Triglycerides: 111 mg/dL (ref 0.0–149.0)
VLDL: 22.2 mg/dL (ref 0.0–40.0)

## 2022-01-10 LAB — HEMOGLOBIN A1C: Hgb A1c MFr Bld: 5.8 % (ref 4.6–6.5)

## 2022-01-10 NOTE — Telephone Encounter (Signed)
  Follow up Call-     01/09/2022    7:20 AM  Call back number  Post procedure Call Back phone  # [phone]  Permission to leave phone message Yes     Patient questions:  Do you have a fever, pain , or abdominal swelling? No. Pain Score  0 *  Have you tolerated food without any problems? Yes.    Have you been able to return to your normal activities? Yes.    Do you have any questions about your discharge instructions: Diet   No. Medications  No. Follow up visit  No.  Do you have questions or concerns about your Care? No.  Actions: * If pain score is 4 or above: No action needed, pain <4.

## 2022-01-11 ENCOUNTER — Encounter: Payer: Self-pay | Admitting: Internal Medicine

## 2022-01-17 ENCOUNTER — Encounter: Payer: Self-pay | Admitting: Family Medicine

## 2022-01-17 ENCOUNTER — Ambulatory Visit (INDEPENDENT_AMBULATORY_CARE_PROVIDER_SITE_OTHER): Payer: Medicare HMO | Admitting: Family Medicine

## 2022-01-17 VITALS — BP 118/70 | HR 63 | Temp 98.2°F | Ht <= 58 in | Wt 165.9 lb

## 2022-01-17 DIAGNOSIS — Z0000 Encounter for general adult medical examination without abnormal findings: Secondary | ICD-10-CM | POA: Diagnosis not present

## 2022-01-17 NOTE — Progress Notes (Signed)
Complete physical exam  Patient: Robin Francis   DOB: Nov 13, 1945   76 y.o. Female  MRN: [id number]  Subjective:    Chief Complaint  Patient presents with   Annual Exam    Robin Francis is a 76 y.o. female who presents today for a complete physical exam. She reports consuming a general diet. Home exercise routine includes walking 2 hrs per week. She generally feels well. She reports sleeping well. She does not have additional problems to discuss today.    Most recent fall risk assessment:    01/17/2022    9:22 AM  Fall Risk   Falls in the past year? 0  Number falls in past yr: 0  Injury with Fall? 0  Risk for fall due to : No Fall Risks  Follow up Falls evaluation completed     Most recent depression screenings:    01/17/2022    9:24 AM 11/02/2021   12:06 PM  PHQ 2/9 Scores  PHQ - 2 Score 0 0  PHQ- 9 Score 0 0    Vision:Within last year and Dental: No current dental problems and Receives regular dental care  Patient Active Problem List   Diagnosis Date Noted   Healthy adult on routine physical examination 01/17/2022   Elevated glucose 07/10/2018   Hyperlipidemia 07/10/2018   Stricture and stenosis of esophagus 01/06/2013   Special screening for malignant neoplasms, colon 11/07/2012   Essential hypertension, benign 04/04/2012   Low bone mass    Postop Acute blood loss anemia 04/13/2011   OA (osteoarthritis) of knee 04/03/2011   MENOPAUSAL SYNDROME 09/17/2008   Osteoarthritis 09/17/2008      Patient Care Team: Farrel Conners, MD as PCP - General (Family Medicine) Dermatology, Presley Raddle, Leory Plowman, MD as Consulting Physician (Ophthalmology)   Outpatient Medications Prior to Visit  Medication Sig   amLODipine (NORVASC) 2.5 MG tablet Take 1 tablet (2.5 mg total) by mouth daily.   Ascorbic Acid (VITAMIN C) 1000 MG tablet Take 1,000 mg by mouth daily.   calcium carbonate (OS-CAL) 600 MG TABS tablet Take 600 mg by mouth 2 (two) times daily with a  meal.   cetirizine (ZYRTEC) 10 MG tablet Take 10 mg by mouth daily.   Cholecalciferol (VITAMIN D) 2000 units tablet Take 2,000 Units by mouth daily.   CINNAMON PO Take 1,000 mg by mouth daily.   Coenzyme Q10 (CO Q 10 PO) Take 120 mg by mouth.   losartan-hydrochlorothiazide (HYZAAR) 100-25 MG tablet Take 1 tablet by mouth daily.   Multiple Vitamin (MULTIVITAMIN) tablet Take 1 tablet by mouth daily.   Omega 3-6-9 Fatty Acids (OMEGA 3-6-9 COMPLEX PO) Take by mouth.   pantoprazole (PROTONIX) 40 MG tablet Take 1 tablet (40 mg total) by mouth daily.   Red Yeast Rice Extract (RED YEAST RICE PO) Take 240 mg by mouth 2 (two) times daily.   thiamine (VITAMIN B-1) 100 MG tablet Take 100 mg by mouth daily.   TURMERIC PO Take 1 tablet by mouth daily. Takes a 1000 mg   UNABLE TO FIND Beet root   vitamin E 400 UNIT capsule Take 400 Units by mouth daily.   No facility-administered medications prior to visit.    Review of Systems  HENT:  Negative for hearing loss.   Eyes:  Negative for blurred vision.  Respiratory:  Negative for shortness of breath.   Cardiovascular:  Negative for chest pain.  Gastrointestinal: Negative.   Genitourinary: Negative.   Musculoskeletal:  Negative for back  pain.  Neurological:  Negative for headaches.  Psychiatric/Behavioral:  Negative for depression.   All other systems reviewed and are negative.         Objective:     BP 118/70 (BP Location: Left Arm, Patient Position: Sitting, Cuff Size: Normal)   Pulse 63   Temp 98.2 F (36.8 C) (Oral)   Ht 4' 8.75" (1.441 m)   Wt 165 lb 14.4 oz (75.3 kg)   SpO2 98%   BMI 36.22 kg/m  BP Readings from Last 3 Encounters:  01/17/22 118/70  01/09/22 (!) 144/76  11/24/21 120/82      Physical Exam Vitals reviewed.  Constitutional:      Appearance: Normal appearance. She is well-groomed and normal weight.  HENT:     Right Ear: Tympanic membrane and ear canal normal.     Left Ear: Tympanic membrane and ear canal  normal.     Mouth/Throat:     Mouth: Mucous membranes are moist.     Pharynx: No oropharyngeal exudate or posterior oropharyngeal erythema.  Eyes:     Conjunctiva/sclera: Conjunctivae normal.  Neck:     Thyroid: No thyromegaly.  Cardiovascular:     Rate and Rhythm: Normal rate and regular rhythm.     Pulses: Normal pulses.     Heart sounds: S1 normal and S2 normal.  Pulmonary:     Effort: Pulmonary effort is normal.     Breath sounds: Normal breath sounds and air entry.  Abdominal:     General: Bowel sounds are normal.     Palpations: Abdomen is soft.  Musculoskeletal:        General: Normal range of motion.     Right lower leg: No edema.     Left lower leg: No edema.  Skin:    General: Skin is warm and dry.  Neurological:     Mental Status: She is alert and oriented to person, place, and time. Mental status is at baseline.     Gait: Gait is intact.  Psychiatric:        Mood and Affect: Mood and affect normal.        Speech: Speech normal.        Behavior: Behavior normal.        Judgment: Judgment normal.      No results found for any visits on 01/17/22. Last metabolic panel Lab Results  Component Value Date   GLUCOSE 101 (H) 01/10/2022   NA 140 01/10/2022   K 3.8 01/10/2022   CL 101 01/10/2022   CO2 31 01/10/2022   BUN 11 01/10/2022   CREATININE 0.79 01/10/2022   GFRNONAA >60 04/20/2015   CALCIUM 9.4 01/10/2022   PROT 6.9 01/10/2022   ALBUMIN 4.2 01/10/2022   BILITOT 1.0 01/10/2022   ALKPHOS 69 01/10/2022   AST 24 01/10/2022   ALT 23 01/10/2022   ANIONGAP 11 04/20/2015   Last lipids Lab Results  Component Value Date   CHOL 212 (H) 01/10/2022   HDL 63.30 01/10/2022   LDLCALC 126 (H) 01/10/2022   LDLDIRECT 136.8 09/26/2012   TRIG 111.0 01/10/2022   CHOLHDL 3 01/10/2022   Last hemoglobin A1c Lab Results  Component Value Date   HGBA1C 5.8 01/10/2022        Assessment & Plan:    Routine Health Maintenance and Physical Exam  Immunization  History  Administered Date(s) Administered   Influenza, High Dose Seasonal PF 12/05/2013, 11/17/2014, 12/20/2015, 12/21/2016, 12/06/2017   Influenza,inj,Quad PF,6+ Mos 11/11/2018   Influenza-Unspecified  11/11/2019, 11/18/2020, 11/11/2021   PFIZER(Purple Top)SARS-COV-2 Vaccination 03/06/2019, 04/03/2019, 11/11/2019, 05/11/2020   PNEUMOCOCCAL CONJUGATE-20 01/07/2021   Pfizer Covid-19 Vaccine Bivalent Booster 52yr & up 11/18/2020, 06/09/2021, 11/11/2021   Pneumococcal Conjugate-13 10/06/2013   Pneumococcal Polysaccharide-23 10/03/2012   Tdap 09/30/2010, 06/09/2021   Zoster Recombinat (Shingrix) 07/16/2017, 10/24/2017    Health Maintenance  Topic Date Due   COVID-19 Vaccine (8 - 2023-24 season) 02/02/2022 (Originally 01/06/2022)   Medicare Annual Wellness ([place]  03/21/2022   MAMMOGRAM  08/06/2022   COLONOSCOPY (Pts 45-449yrInsurance coverage will need to be confirmed)  01/10/2027   DTaP/Tdap/Td (3 - Td or Tdap) 06/10/2031   Pneumonia Vaccine 6545Years old  Completed   INFLUENZA VACCINE  Completed   DEXA SCAN  Completed   Hepatitis C Screening  Completed   Zoster Vaccines- Shingrix  Completed   HPV VACCINES  Aged Out    Discussed health benefits of physical activity, and encouraged her to engage in regular exercise appropriate for her age and condition.  Problem List Items Addressed This Visit       Unprioritized   Healthy adult on routine physical examination - Primary  Normal physical exam findings today. Handouts given on healthy eating plan for prediabetics. I recommended reducing sugar and starches in her diet. A1C was 5.8 today.   Return in about 6 months (around 07/19/2022) for follow up DM and HTN.     BaFarrel ConnersMD

## 2022-01-23 DIAGNOSIS — Z8249 Family history of ischemic heart disease and other diseases of the circulatory system: Secondary | ICD-10-CM | POA: Diagnosis not present

## 2022-01-23 DIAGNOSIS — Z6837 Body mass index (BMI) 37.0-37.9, adult: Secondary | ICD-10-CM | POA: Diagnosis not present

## 2022-01-23 DIAGNOSIS — E669 Obesity, unspecified: Secondary | ICD-10-CM | POA: Diagnosis not present

## 2022-01-23 DIAGNOSIS — Z809 Family history of malignant neoplasm, unspecified: Secondary | ICD-10-CM | POA: Diagnosis not present

## 2022-01-23 DIAGNOSIS — Z888 Allergy status to other drugs, medicaments and biological substances status: Secondary | ICD-10-CM | POA: Diagnosis not present

## 2022-01-23 DIAGNOSIS — I10 Essential (primary) hypertension: Secondary | ICD-10-CM | POA: Diagnosis not present

## 2022-01-23 DIAGNOSIS — K219 Gastro-esophageal reflux disease without esophagitis: Secondary | ICD-10-CM | POA: Diagnosis not present

## 2022-02-16 ENCOUNTER — Other Ambulatory Visit: Payer: Self-pay | Admitting: Family Medicine

## 2022-02-16 DIAGNOSIS — K219 Gastro-esophageal reflux disease without esophagitis: Secondary | ICD-10-CM

## 2022-03-27 ENCOUNTER — Ambulatory Visit: Payer: Medicare HMO

## 2022-03-28 ENCOUNTER — Telehealth (INDEPENDENT_AMBULATORY_CARE_PROVIDER_SITE_OTHER): Payer: Medicare HMO | Admitting: Family Medicine

## 2022-03-28 DIAGNOSIS — Z0000 Encounter for general adult medical examination without abnormal findings: Secondary | ICD-10-CM | POA: Diagnosis not present

## 2022-03-28 NOTE — Patient Instructions (Signed)
I really enjoyed getting to talk with you today! I am available on Tuesdays and Thursdays for virtual visits if you have any questions or concerns, or if I can be of any further assistance.   CHECKLIST FROM ANNUAL WELLNESS VISIT:  -Follow up (please call to schedule if not scheduled after visit):   -yearly for annual wellness visit with primary care office  Here is a list of your preventive care/health maintenance measures and the plan for each if any are due:  Health Maintenance  Topic Date Due   COVID-19 Vaccine (8 - 2023-24 season) 11/07/2022 (Originally 01/06/2022)   MAMMOGRAM  08/06/2022   Medicare Annual Wellness ([facility])  03/29/2023   COLONOSCOPY (Pts 45-56yr Insurance coverage will need to be confirmed)  01/10/2027   DTaP/Tdap/Td (3 - Td or Tdap) 06/10/2031   Pneumonia Vaccine 75 Years old  Completed   INFLUENZA VACCINE  Completed   DEXA SCAN  Completed   Hepatitis C Screening  Completed   Zoster Vaccines- Shingrix  Completed   HPV VACCINES  Aged Out    -See a dentist at least yearly  -Get your eyes checked and then per your eye specialist's recommendations  -Other issues addressed today: -no more than one 5 oz pour of wine in any given 24 hour period to ensure your body can process it thoroughly with less harm to your system  -I have included below further information regarding a healthy whole foods based diet, physical activity guidelines for adults, stress management and opportunities for social connections. I hope you find this information useful.   -----------------------------------------------------------------------------------------------------------------------------------------------------------------------------------------------------------------------------------------------------------  NUTRITION: -eat real food: lots of colorful vegetables (half the plate) and fruits -5-7 servings of vegetables and fruits per day (fresh or steamed is best), exp. 2 servings  of vegetables with lunch and dinner and 2 servings of fruit per day. Berries and greens such as kale and collards are great choices.  -consume on a regular basis: whole grains (make sure first ingredient on label contains the word "whole"), fresh fruits, fish, nuts, seeds, healthy oils (such as olive oil, avocado oil, grape seed oil) -may eat small amounts of dairy and lean meat on occasion, but avoid processed meats such as ham, bacon, lunch meat, etc. -drink water -try to avoid fast food and pre-packaged foods, processed meat -most experts advise limiting sodium to < 23030mper day, should limit further is any chronic conditions such as high blood pressure, heart disease, diabetes, etc. The American Heart Association advised that < 150057ms is ideal -try to avoid foods that contain any ingredients with names you do not recognize  -try to avoid sugar/sweets (except for the natural sugar that occurs in fresh fruit) -try to avoid sweet drinks -try to avoid white rice, white bread, pasta (unless whole grain), white or yellow potatoes  EXERCISE GUIDELINES FOR ADULTS: -if you wish to increase your physical activity, do so gradually and with the approval of your doctor -STOP and seek medical care immediately if you have any chest pain, chest discomfort or trouble breathing when starting or increasing exercise  -move and stretch your body, legs, feet and arms when sitting for long periods -Physical activity guidelines for optimal health in adults: -least 150 minutes per week of aerobic exercise (can talk, but not sing) once approved by your doctor, 20-30 minutes of sustained activity or two 10 minute episodes of sustained activity every day.  -resistance training at least 2 days per week if approved by your doctor -balance exercises 3+ days  per week:   Stand somewhere where you have something sturdy to hold onto if you lose balance.    1) lift up on toes, start with 5x per day and work up to 20x   2)  stand and lift on leg straight out to the side so that foot is a few inches of the floor, start with 5x each side and work up to 20x each side   3) stand on one foot, start with 5 seconds each side and work up to 20 seconds on each side  If you need ideas or help with getting more active:  -Silver sneakers https://tools.silversneakers.com  -Walk with a Doc: http://stephens-thompson.biz/  -try to include resistance (weight lifting/strength building) and balance exercises twice per week: or the following link for ideas: ChessContest.fr  UpdateClothing.com.cy  STRESS MANAGEMENT: -can try meditating, or just sitting quietly with deep breathing while intentionally relaxing all parts of your body for 5 minutes daily -if you need further help with stress, anxiety or depression please follow up with your primary doctor or contact the wonderful folks at [place]: [place]: -options in [place] if you wish to engage in more social and exercise related activities:  -Silver sneakers https://tools.silversneakers.com  -Walk with a Doc: http://stephens-thompson.biz/  -Check out the [place] 50+ section on the [place] [employer] (hiking clubs, book clubs, cards and games, chess, exercise classes, aquatic classes and much more) - see the website for details: https://www.[place].gov/departments/parks-recreation/active-adults50  -YouTube has lots of exercise videos for different ages and abilities as well  -[place] (a variety of indoor and outdoor inperson activities for adults). 515-700-8076. [address]  -Virtual Online Classes (a variety of topics): see seniorplanet.org or call 4015708936  -consider volunteering at a school, hospice center, church, senior center or elsewhere

## 2022-03-28 NOTE — Progress Notes (Signed)
PATIENT CHECK-IN and HEALTH RISK ASSESSMENT QUESTIONNAIRE:  -completed by phone/video for upcoming Medicare Preventive Visit  Pre-Visit Check-in: 1)Vitals (height, wt, BP, etc) - record in vitals section for visit on day of visit 2)Review and Update Medications, Allergies PMH, Surgeries, Social history in Epic 3)Hospitalizations in the last year with date/reason?-No 4)Review and Update Care Team (patient's specialists) in Epic 5) Complete PHQ9 in Epic  6) Complete Fall Screening in Epic 7)Review all Health Maintenance Due and order under PCP if not done.  8)Medicare Wellness Questionnaire: Answer theses question about your habits: Do you drink alcohol? Yes,Wine  If yes, how many drinks do you have a day?On weekends when go out to dinner Have you ever smoked?No Quit date if applicable?  No How many packs a day do/did you smoke? No Do you use smokeless tobacco?No Do you use an illicit drugs?No Do you exercises? Yes  IF so, what type and how many days/minutes per week?Walks 5 days per week,  goes to the Gym 3 days per week.   Walk-27mns, Gym-1 hours Are you sexually active? No Number of partners?)No  Goes out to eat a lot Typical breakfast: Cup of coffee Typical lunch:sandwich, chips Typical dinner: protein, salad, veg,  starch Typical snacks:string cheese  Beverages: water,coffee  Answer theses question about you: Can you perform most household chores?Yes Do you find it hard to follow a conversation in a noisy room?No Do you often ask people to speak up or repeat themselves?Sometimes Do you feel that you have a problem with memory?No Do you balance your checkbook and or bank acounts?Yes Do you feel safe at home?Yes Last dentist visit? 2 months ago  Do you need assistance with any of the following: Please note if so No assistance needed with any below.  Driving?  Feeding yourself?  Getting from bed to chair?  Getting to the toilet?  Bathing or showering?  Dressing  yourself?  Managing money?  Climbing a flight of stairs  Preparing meals?  Do you have Advanced Directives in place (Living Will, Healthcare Power or Attorney)? Yes   [facility]    Do you currently use prescribed or non-prescribed narcotic or opioid pain medications?No, at times take a Tylenol.    Do you have a history or close family history of breast, ovarian, tubal or peritoneal cancer or a family member with BRCA (breast cancer susceptibility 1 and 2) gene mutations?  Nurse/Assistant Credentials/time stamp:   ----------------------------------------------------------------------------------------------------------------------------------------------------------------------------------------------------------------------   MEDICARE ANNUAL PREVENTIVE VISIT WITH PROVIDER: (Welcome to [employer] initial annual wellness or annual wellness exam)  Virtual Visit via Video Note  I connected with Robin BRANYANon 03/28/22  a video enabled telemedicine application and verified that I am speaking with the correct person using two identifiers.  Location patient: home Location provider:work or home office Persons participating in the virtual visit: patient, provider  Concerns and/or follow up today: stable, no concerns   See HM section in Epic for other details of completed HM.    ROS: negative for report of fevers, unintentional weight loss, vision changes, vision loss, hearing loss or change, chest pain, sob, hemoptysis, melena, hematochezia, hematuria, genital discharge or lesions, falls, bleeding or bruising, loc, thoughts of suicide or self harm, memory loss  Patient-completed extensive health risk assessment - reviewed and discussed with the patient: See Health Risk Assessment completed with patient prior to the visit either above or in recent phone note. This was reviewed in detailed with the patient today and  appropriate  recommendations, orders and referrals were placed as needed per Summary below and patient instructions.   Review of Medical History: -PMH, PSH, Family History and current specialty and care providers reviewed and updated and listed below   Patient Care Team: Farrel Conners, MD as PCP - General (Family Medicine) Dermatology, Dyanne Iha, MD as Consulting Physician (Ophthalmology)   Past Medical History:  Diagnosis Date   Basal cell carcinoma (BCC) of buttock    GERD (gastroesophageal reflux disease)    Hypertension    OSTEOARTHRITIS 09/17/2008   Osteopenia 11/2018   T score -1.7 distal third of radius FRAX 8.9% / 1.1%    Past Surgical History:  Procedure Laterality Date   BACK SURGERY     LOWER   KNEE ARTHROSCOPY     left   PARTIAL KNEE ARTHROPLASTY Right 04/19/2015   Procedure: UNICOMPARTMENTAL KNEE MEDIAL REPLACMENT;  Surgeon: Gaynelle Arabian, MD;  Location: [facility];  Service: Orthopedics;  Laterality: Right;   TONSILLECTOMY     as child   TOTAL KNEE ARTHROPLASTY  04/03/2011   Procedure: TOTAL KNEE ARTHROPLASTY;  Surgeon: Gearlean Alf, MD;  Location: [facility];  Service: Orthopedics;  Laterality: Left;   TRIGGER FINGER RELEASE Left     Social History   Socioeconomic History   Marital status: Married    Spouse name: Not on file   Number of children: Not on file   Years of education: Not on file   Highest education level: Not on file  Occupational History   Not on file  Tobacco Use   Smoking status: Never    Passive exposure: Never   Smokeless tobacco: Never  Vaping Use   Vaping Use: Never used  Substance and Sexual Activity   Alcohol use: Yes    Comment: occassionally-social   Drug use: No   Sexual activity: Not Currently    Birth control/protection: Post-menopausal    Comment: 1st intercourse 77 yo-Fewer than 5 partners  Other Topics Concern   Not on file  Social History Narrative   Not on file   Social Determinants of Health   Financial  Resource Strain: Low Risk  (03/21/2021)   Overall Financial Resource Strain (CARDIA)    Difficulty of Paying Living Expenses: Not hard at all  Food Insecurity: No Food Insecurity (03/21/2021)   Hunger Vital Sign    Worried About Running Out of Food in the Last Year: Never true    [place] in the Last Year: Never true  Transportation Needs: No Transportation Needs (03/21/2021)   PRAPARE - Hydrologist (Medical): No    Lack of Transportation (Non-Medical): No  Physical Activity: Sufficiently Active (03/21/2021)   Exercise Vital Sign    Days of Exercise per Week: 5 days    Minutes of Exercise per Session: 40 min  Stress: No Stress Concern Present (03/21/2021)   [place]    Feeling of Stress : Not at all  Social Connections: Moderately Isolated (04/13/2020)   Social Connection and Isolation Panel [NHANES]    Frequency of Communication with Friends and Family: More than three times a week    Frequency of Social Gatherings with Friends and Family: Twice a week    Attends Religious Services: Never    Marine scientist or Organizations: No    Attends Archivist Meetings: Never    Marital Status: Married  Human resources officer Violence: Not At  Risk (04/13/2020)   Humiliation, Afraid, Rape, and Kick questionnaire    Fear of Current or Ex-Partner: No    Emotionally Abused: No    Physically Abused: No    Sexually Abused: No    Family History  Problem Relation Age of Onset   Pancreatic cancer Mother    Other Father        suicide   High blood pressure Brother    Breast cancer Maternal Grandmother        Age 21   Colon cancer Neg Hx    Esophageal cancer Neg Hx    Rectal cancer Neg Hx    Stomach cancer Neg Hx     Current Outpatient Medications on File Prior to Visit  Medication Sig Dispense Refill   amLODipine (NORVASC) 2.5 MG tablet Take 1 tablet (2.5 mg total) by mouth daily.  90 tablet 3   Ascorbic Acid (VITAMIN C) 1000 MG tablet Take 1,000 mg by mouth daily.     calcium carbonate (OS-CAL) 600 MG TABS tablet Take 600 mg by mouth 2 (two) times daily with a meal.     cetirizine (ZYRTEC) 10 MG tablet Take 10 mg by mouth daily.     Cholecalciferol (VITAMIN D) 2000 units tablet Take 2,000 Units by mouth daily.     CINNAMON PO Take 1,000 mg by mouth daily.     Coenzyme Q10 (CO Q 10 PO) Take 120 mg by mouth.     losartan-hydrochlorothiazide (HYZAAR) 100-25 MG tablet Take 1 tablet by mouth daily. 90 tablet 2   Multiple Vitamin (MULTIVITAMIN) tablet Take 1 tablet by mouth daily.     Omega 3-6-9 Fatty Acids (OMEGA 3-6-9 COMPLEX PO) Take by mouth.     pantoprazole (PROTONIX) 40 MG tablet TAKE 1 TABLET BY MOUTH EVERY DAY 90 tablet 1   Red Yeast Rice Extract (RED YEAST RICE PO) Take 240 mg by mouth 2 (two) times daily.     thiamine (VITAMIN B-1) 100 MG tablet Take 100 mg by mouth daily.     TURMERIC PO Take 1 tablet by mouth daily. Takes a 1000 mg     UNABLE TO FIND Beet root     vitamin E 400 UNIT capsule Take 400 Units by mouth daily.     No current facility-administered medications on file prior to visit.    Allergies  Allergen Reactions   Lisinopril Other (See Comments)    Cough       Physical Exam There were no vitals filed for this visit. Estimated body mass index is 36.22 kg/m as calculated from the following:   Height as of 01/17/22: 4' 8.75" (1.441 m).   Weight as of 01/17/22: 165 lb 14.4 oz (75.3 kg).  EKG (optional): deferred due to virtual visit  GENERAL: alert, oriented, no acute distress detected, full vision exam deferred due to pandemic and/or virtual encounter  HEENT: atraumatic, conjunttiva clear, no obvious abnormalities on inspection of external nose and ears  NECK: normal movements of the head and neck  LUNGS: on inspection no signs of respiratory distress, breathing rate appears normal, no obvious gross SOB, gasping or wheezing  CV:  no obvious cyanosis  MS: moves all visible extremities without noticeable abnormality  PSYCH/NEURO: pleasant and cooperative, no obvious depression or anxiety, speech and thought processing grossly intact, Cognitive function grossly intact  Flowsheet Row Video Visit from 03/28/2022 in [place] [facility] Total Score 0  03/28/2022   10:26 AM 01/17/2022    9:24 AM 11/02/2021   12:06 PM 03/21/2021    1:47 PM 01/07/2021    8:04 AM  Depression screen PHQ 2/9  Decreased Interest 0 0 0 0 0  Down, Depressed, Hopeless 0 0 0 0 0  PHQ - 2 Score 0 0 0 0 0  Altered sleeping 0 0 0  0  Tired, decreased energy 0 0 0  0  Change in appetite 0 0 0  0  Feeling bad or failure about yourself  0 0 0  0  Trouble concentrating 0 0 0  0  Moving slowly or fidgety/restless 0 0 0  0  Suicidal thoughts 0 0 0  0  PHQ-9 Score 0 0 0  0  Difficult doing work/chores Not difficult at all           01/07/2021    8:00 AM 03/15/2021   11:40 AM 03/21/2021    1:47 PM 01/17/2022    9:22 AM 03/28/2022   10:25 AM  Fall Risk  Falls in the past year? 0 0 0 0 0  Was there an injury with Fall?  0  0 0  Fall Risk Category Calculator    0 0  Fall Risk Category (Retired)    Low   (RETIRED) Patient Fall Risk Level    Low fall risk   Patient at Risk for Falls Due to   Medication side effect No Fall Risks   Fall risk Follow up   Falls evaluation completed;Education provided;Falls prevention discussed Falls evaluation completed      SUMMARY AND PLAN:  Encounter for Medicare annual wellness exam     Discussed applicable health maintenance/preventive health measures and advised and referred or ordered per patient preferences:  Health Maintenance  Topic Date Due   COVID-19 Vaccine (8 - 2023-24 season) 11/07/2022 (Originally 01/06/2022) - she had the most recent booster   MAMMOGRAM  08/06/2022   Medicare Annual Wellness (AWV)  03/29/2023   COLONOSCOPY (Pts 45-62yr Insurance  coverage will need to be confirmed)  01/10/2027   DTaP/Tdap/Td (3 - Td or Tdap) 06/10/2031   Pneumonia Vaccine 75 Years old  Completed   INFLUENZA VACCINE  Completed   DEXA SCAN  Completed in 2023   Hepatitis C Screening  Completed   Zoster Vaccines- Shingrix  Completed   HPV VACCINES  Aged OSafeco Corporationand counseling on the following was provided based on the above review of health and a plan/checklist for the patient, along with additional information discussed, was provided for the patient in the patient instructions :   -Advised and counseled on maintaining healthy weight and healthy lifestyle - including the importance of a healthy diet, regular physical activity, social connections and stress management. -Advised and counseled on a whole foods based healthy diet and regular exercise: discussed a heart healthy whole foods based diet at length. A summary of a healthy diet was provided in the Patient Instructions. -congratulated on regular exercise, guidelines discussed, balance exercises provided and discussed how to do safely - she plans to consider adding to her exercise routine.   -Advise yearly dental visits at minimum and regular eye exams -Advised and counseled on alcohol, advised no more than one 5oz pour of wine per day  Follow up: see patient instructions     Patient Instructions  I really enjoyed getting to talk with you today! I am available on Tuesdays and Thursdays for virtual visits if you  have any questions or concerns, or if I can be of any further assistance.   CHECKLIST FROM ANNUAL WELLNESS VISIT:  -Follow up (please call to schedule if not scheduled after visit):   -yearly for annual wellness visit with primary care office  Here is a list of your preventive care/health maintenance measures and the plan for each if any are due:  Health Maintenance  Topic Date Due   COVID-19 Vaccine (8 - 2023-24 season) 11/07/2022 (Originally 01/06/2022)   MAMMOGRAM   08/06/2022   Medicare Annual Wellness (AWV)  03/29/2023   COLONOSCOPY (Pts 45-35yr Insurance coverage will need to be confirmed)  01/10/2027   DTaP/Tdap/Td (3 - Td or Tdap) 06/10/2031   Pneumonia Vaccine 75 Years old  Completed   INFLUENZA VACCINE  Completed   DEXA SCAN  Completed   Hepatitis C Screening  Completed   Zoster Vaccines- Shingrix  Completed   HPV VACCINES  Aged Out    -See a dentist at least yearly  -Get your eyes checked and then per your eye specialist's recommendations  -Other issues addressed today: -no more than one 5 oz pour of wine in any given 24 hour period to ensure your body can process it thoroughly with less harm to your system  -I have included below further information regarding a healthy whole foods based diet, physical activity guidelines for adults, stress management and opportunities for social connections. I hope you find this information useful.   -----------------------------------------------------------------------------------------------------------------------------------------------------------------------------------------------------------------------------------------------------------  NUTRITION: -eat real food: lots of colorful vegetables (half the plate) and fruits -5-7 servings of vegetables and fruits per day (fresh or steamed is best), exp. 2 servings of vegetables with lunch and dinner and 2 servings of fruit per day. Berries and greens such as kale and collards are great choices.  -consume on a regular basis: whole grains (make sure first ingredient on label contains the word "whole"), fresh fruits, fish, nuts, seeds, healthy oils (such as olive oil, avocado oil, grape seed oil) -may eat small amounts of dairy and lean meat on occasion, but avoid processed meats such as ham, bacon, lunch meat, etc. -drink water -try to avoid fast food and pre-packaged foods, processed meat -most experts advise limiting sodium to < 23020mper day,  should limit further is any chronic conditions such as high blood pressure, heart disease, diabetes, etc. The American Heart Association advised that < 150013ms is ideal -try to avoid foods that contain any ingredients with names you do not recognize  -try to avoid sugar/sweets (except for the natural sugar that occurs in fresh fruit) -try to avoid sweet drinks -try to avoid white rice, white bread, pasta (unless whole grain), white or yellow potatoes  EXERCISE GUIDELINES FOR ADULTS: -if you wish to increase your physical activity, do so gradually and with the approval of your doctor -STOP and seek medical care immediately if you have any chest pain, chest discomfort or trouble breathing when starting or increasing exercise  -move and stretch your body, legs, feet and arms when sitting for long periods -Physical activity guidelines for optimal health in adults: -least 150 minutes per week of aerobic exercise (can talk, but not sing) once approved by your doctor, 20-30 minutes of sustained activity or two 10 minute episodes of sustained activity every day.  -resistance training at least 2 days per week if approved by your doctor -balance exercises 3+ days per week:   Stand somewhere where you have something sturdy to hold onto if you lose balance.  1) lift up on toes, start with 5x per day and work up to 20x   2) stand and lift on leg straight out to the side so that foot is a few inches of the floor, start with 5x each side and work up to 20x each side   3) stand on one foot, start with 5 seconds each side and work up to 20 seconds on each side  If you need ideas or help with getting more active:  -Silver sneakers https://tools.silversneakers.com  -Walk with a Doc: http://stephens-thompson.biz/  -try to include resistance (weight lifting/strength building) and balance exercises twice per week: or the following link for  ideas: ChessContest.fr  UpdateClothing.com.cy  STRESS MANAGEMENT: -can try meditating, or just sitting quietly with deep breathing while intentionally relaxing all parts of your body for 5 minutes daily -if you need further help with stress, anxiety or depression please follow up with your primary doctor or contact the wonderful folks at [place]: [place]: -options in [place] if you wish to engage in more social and exercise related activities:  -Silver sneakers https://tools.silversneakers.com  -Walk with a Doc: http://stephens-thompson.biz/  -Check out the [place] 50+ section on the [employer] (hiking clubs, book clubs, cards and games, chess, exercise classes, aquatic classes and much more) - see the website for details: https://www.[place].gov/departments/parks-recreation/active-adults50  -YouTube has lots of exercise videos for different ages and abilities as well  -[place] (a variety of indoor and outdoor inperson activities for adults). 813-227-7968. [address].  -Virtual Online Classes (a variety of topics): see seniorplanet.org or call [phone]  -consider volunteering at a school, hospice center, church, senior center or elsewhere           Lucretia Kern, DO

## 2022-07-03 ENCOUNTER — Other Ambulatory Visit: Payer: Self-pay | Admitting: Family Medicine

## 2022-07-03 DIAGNOSIS — K219 Gastro-esophageal reflux disease without esophagitis: Secondary | ICD-10-CM

## 2022-07-19 ENCOUNTER — Encounter: Payer: Self-pay | Admitting: Family Medicine

## 2022-07-19 ENCOUNTER — Ambulatory Visit (INDEPENDENT_AMBULATORY_CARE_PROVIDER_SITE_OTHER): Payer: Medicare HMO | Admitting: Family Medicine

## 2022-07-19 VITALS — BP 130/80 | HR 65 | Temp 97.8°F | Ht <= 58 in | Wt 167.5 lb

## 2022-07-19 DIAGNOSIS — R7309 Other abnormal glucose: Secondary | ICD-10-CM

## 2022-07-19 DIAGNOSIS — I10 Essential (primary) hypertension: Secondary | ICD-10-CM

## 2022-07-19 DIAGNOSIS — Z1231 Encounter for screening mammogram for malignant neoplasm of breast: Secondary | ICD-10-CM

## 2022-07-19 LAB — POCT GLYCOSYLATED HEMOGLOBIN (HGB A1C): Hemoglobin A1C: 5.5 % (ref 4.0–5.6)

## 2022-07-19 MED ORDER — LOSARTAN POTASSIUM-HCTZ 100-25 MG PO TABS: 1.0000 | ORAL_TABLET | Freq: Every day | ORAL | 3 refills | Status: DC

## 2022-07-19 NOTE — Progress Notes (Signed)
Established Patient Office Visit  Subjective   Patient ID: Robin Francis, female    DOB: 1945-08-04  Age: 77 y.o. MRN: [id number]  Chief Complaint  Patient presents with   Medical Management of Chronic Issues    Patient is here for 6 month follow up  HTN -- BP in office performed and is well controlled. She  reports no side effects to the medications, no chest pain, SOB, dizziness or headaches. She has a BP cuff at home and is checking BP regularly, reports they are in the normal range.   Health maintenance reviewed -- she is due for her mammogram which she gets at [facility].     Current Outpatient Medications  Medication Instructions   amLODipine (NORVASC) 2.5 mg, Oral, Daily   calcium carbonate (OS-CAL) 600 mg, Oral, 2 times daily with meals   cetirizine (ZYRTEC) 10 mg, Daily   CINNAMON PO 1,000 mg, Oral, Daily   Coenzyme Q10 (CO Q 10 PO) 120 mg, Oral   losartan-hydrochlorothiazide (HYZAAR) 100-25 MG tablet 1 tablet, Oral, Daily   Multiple Vitamin (MULTIVITAMIN) tablet 1 tablet, Oral, Daily   Omega 3-6-9 Fatty Acids (OMEGA 3-6-9 COMPLEX PO) Oral   pantoprazole (PROTONIX) 40 mg, Oral, Daily   Red Yeast Rice Extract (RED YEAST RICE PO) 240 mg, Oral, 2 times daily   thiamine (VITAMIN B-1) 100 mg, Oral, Daily   TURMERIC PO 1 tablet, Oral, Daily, Takes a 1000 mg   UNABLE TO FIND Beet root   vitamin C 1,000 mg, Oral, Daily   Vitamin D 2,000 Units, Oral, Daily   vitamin E 400 Units, Oral, Daily    Patient Active Problem List   Diagnosis Date Noted   Healthy adult on routine physical examination 01/17/2022   Elevated glucose 07/10/2018   Hyperlipidemia 07/10/2018   Stricture and stenosis of esophagus 01/06/2013   Special screening for malignant neoplasms, colon 11/07/2012   Essential hypertension, benign 04/04/2012   Low bone mass    Postop Acute blood loss anemia 04/13/2011   OA (osteoarthritis) of knee 04/03/2011   MENOPAUSAL SYNDROME 09/17/2008   Osteoarthritis  09/17/2008      Review of Systems  All other systems reviewed and are negative.     Objective:     BP 130/80 (BP Location: Left Arm, Patient Position: Sitting, Cuff Size: Large)   Pulse 65   Temp 97.8 F (36.6 C) (Oral)   Ht 4' 8.75" (1.441 m)   Wt 167 lb 8 oz (76 kg)   SpO2 98%   BMI 36.57 kg/m    Physical Exam Vitals reviewed.  Constitutional:      Appearance: Normal appearance. She is well-groomed. She is obese.  Eyes:     Conjunctiva/sclera: Conjunctivae normal.  Neck:     Thyroid: No thyromegaly.  Cardiovascular:     Rate and Rhythm: Normal rate and regular rhythm.     Pulses: Normal pulses.     Heart sounds: S1 normal and S2 normal.  Pulmonary:     Effort: Pulmonary effort is normal.     Breath sounds: Normal breath sounds and air entry.  Abdominal:     General: Bowel sounds are normal.  Musculoskeletal:     Right lower leg: Edema (1+ ankles BL) present.     Left lower leg: Edema present.  Neurological:     Mental Status: She is alert and oriented to person, place, and time. Mental status is at baseline.     Gait: Gait is intact.  Psychiatric:  Mood and Affect: Mood and affect normal.        Speech: Speech normal.        Behavior: Behavior normal.        Judgment: Judgment normal.      Results for orders placed or performed in visit on 07/19/22  POC HgB A1c  Result Value Ref Range   Hemoglobin A1C 5.5 4.0 - 5.6 %   HbA1c POC (<> result, manual entry)     HbA1c, POC (prediabetic range)     HbA1c, POC (controlled diabetic range)      Last metabolic panel Lab Results  Component Value Date   GLUCOSE 101 (H) 01/10/2022   NA 140 01/10/2022   K 3.8 01/10/2022   CL 101 01/10/2022   CO2 31 01/10/2022   BUN 11 01/10/2022   CREATININE 0.79 01/10/2022   GFRNONAA >60 04/20/2015   CALCIUM 9.4 01/10/2022   PROT 6.9 01/10/2022   ALBUMIN 4.2 01/10/2022   BILITOT 1.0 01/10/2022   ALKPHOS 69 01/10/2022   AST 24 01/10/2022   ALT 23 01/10/2022    ANIONGAP 11 04/20/2015   Last lipids Lab Results  Component Value Date   CHOL 212 (H) 01/10/2022   HDL 63.30 01/10/2022   LDLCALC 126 (H) 01/10/2022   LDLDIRECT 136.8 09/26/2012   TRIG 111.0 01/10/2022   CHOLHDL 3 01/10/2022      The 16-XWRU ASCVD risk score (Arnett DK, et al., 2019) is: 21.8%    Assessment & Plan:  Elevated glucose -     POCT glycosylated hemoglobin (Hb A1C) Last A1C was 5.8 which showed some prediabetes/ insulin resistance, today A1C is 5.5 which is back in the normal range. Recheck every 6 months.   Essential hypertension, benign Assessment & Plan: BP is well controlled on amlodipine 2.5 mg daily plus losartan- HCT, refills sent below. She does have some mild ankle edema today but pt reports this is chronic, comes and goes, will continue to monitor BP every 6 months.   Orders: -     Losartan Potassium-HCTZ; Take 1 tablet by mouth daily.  Dispense: 90 tablet; Refill: 3     Return in about 6 months (around 01/18/2023) for HTN, annual physical exam-- will need to be fasting for blood work.    Karie Georges, MD

## 2022-07-19 NOTE — Assessment & Plan Note (Signed)
BP is well controlled on amlodipine 2.5 mg daily plus losartan- HCT, refills sent below. She does have some mild ankle edema today but pt reports this is chronic, comes and goes, will continue to monitor BP every 6 months.

## 2022-08-07 DIAGNOSIS — Z1231 Encounter for screening mammogram for malignant neoplasm of breast: Secondary | ICD-10-CM | POA: Diagnosis not present

## 2022-08-07 LAB — HM MAMMOGRAPHY

## 2022-10-20 DIAGNOSIS — R69 Illness, unspecified: Secondary | ICD-10-CM | POA: Diagnosis not present

## 2022-10-24 ENCOUNTER — Other Ambulatory Visit: Payer: Self-pay | Admitting: Family Medicine

## 2022-10-24 DIAGNOSIS — I10 Essential (primary) hypertension: Secondary | ICD-10-CM

## 2022-12-06 ENCOUNTER — Encounter: Payer: Self-pay | Admitting: Obstetrics and Gynecology

## 2022-12-06 ENCOUNTER — Ambulatory Visit (INDEPENDENT_AMBULATORY_CARE_PROVIDER_SITE_OTHER): Payer: Medicare HMO | Admitting: Obstetrics and Gynecology

## 2022-12-06 VITALS — BP 120/78 | HR 64 | Ht <= 58 in | Wt 169.0 lb

## 2022-12-06 DIAGNOSIS — D219 Benign neoplasm of connective and other soft tissue, unspecified: Secondary | ICD-10-CM

## 2022-12-06 DIAGNOSIS — Z9189 Other specified personal risk factors, not elsewhere classified: Secondary | ICD-10-CM

## 2022-12-06 DIAGNOSIS — Z01419 Encounter for gynecological examination (general) (routine) without abnormal findings: Secondary | ICD-10-CM

## 2022-12-06 NOTE — Progress Notes (Signed)
77 y.o. y.o. female here for annual exam. She denies any PM bleeding.   No LMP recorded. Patient is postmenopausal.    G2P2L2  Married. Not sexually active  4 grand-children.   RP:  Established patient presenting for annual gyn exam and management of Osteopenia    HPI: Postmenopausal, well on no HRT.  No PMB.  No pelvic pain. Pap smear 11/2021.  No significant history of abnormal Pap smears.  Pap reflex today.  Mammogram 07/2021 Negative. Colonoscopy 2018, repeat Colono done in 01/2022-one polyp multiple diverticulum. Osteopenia. DEXA 11/2018, T score -1.7 at Lt 1/3 Forearm, FRAX 8.9% / 1.1%.  Repeat BD 2023 stable -1.5 osteopenia no h/o fractures  Body mass index is 36.89 kg/m.   Health labs with Fam MD.   Blood pressure 120/78, pulse 64, height 4' 8.75" (1.441 m), weight 169 lb (76.7 kg), SpO2 99%.     Component Value Date/Time   DIAGPAP  11/24/2021 0951    - Negative for intraepithelial lesion or malignancy (NILM)   ADEQPAP  11/24/2021 0951    Satisfactory for evaluation; transformation zone component PRESENT.    GYN HISTORY:    Component Value Date/Time   DIAGPAP  11/24/2021 0951    - Negative for intraepithelial lesion or malignancy (NILM)   ADEQPAP  11/24/2021 0951    Satisfactory for evaluation; transformation zone component PRESENT.    OB History  Gravida Para Term Preterm AB Living  2 2 2     2   SAB IAB Ectopic Multiple Live Births               # Outcome Date GA Lbr Len/2nd Weight Sex Type Anes PTL Lv  2 Term           1 Term             Past Medical History:  Diagnosis Date   Basal cell carcinoma (BCC) of buttock    GERD (gastroesophageal reflux disease)    Hypertension    OSTEOARTHRITIS 09/17/2008   Osteopenia 11/2018   T score -1.7 distal third of radius FRAX 8.9% / 1.1%    Past Surgical History:  Procedure Laterality Date   BACK SURGERY     LOWER   KNEE ARTHROSCOPY     left   PARTIAL KNEE ARTHROPLASTY Right 04/19/2015   Procedure:  UNICOMPARTMENTAL KNEE MEDIAL REPLACMENT;  Surgeon: Ollen Gross, MD;  Location: [facility];  Service: Orthopedics;  Laterality: Right;   TONSILLECTOMY     as child   TOTAL KNEE ARTHROPLASTY  04/03/2011   Procedure: TOTAL KNEE ARTHROPLASTY;  Surgeon: Loanne Drilling, MD;  Location: [facility];  Service: Orthopedics;  Laterality: Left;   TRIGGER FINGER RELEASE Left     Current Outpatient Medications on File Prior to Visit  Medication Sig Dispense Refill   amLODipine (NORVASC) 2.5 MG tablet TAKE 1 TABLET BY MOUTH EVERY DAY 90 tablet 0   Ascorbic Acid (VITAMIN C) 1000 MG tablet Take 1,000 mg by mouth daily.     calcium carbonate (OS-CAL) 600 MG TABS tablet Take 600 mg by mouth 2 (two) times daily with a meal.     cetirizine (ZYRTEC) 10 MG tablet Take 10 mg by mouth daily.     Cholecalciferol (VITAMIN D) 2000 units tablet Take 2,000 Units by mouth daily.     CINNAMON PO Take 1,000 mg by mouth daily.     Coenzyme Q10 (CO Q 10 PO) Take 120 mg by mouth.  losartan-hydrochlorothiazide (HYZAAR) 100-25 MG tablet Take 1 tablet by mouth daily. 90 tablet 3   Multiple Vitamin (MULTIVITAMIN) tablet Take 1 tablet by mouth daily.     Omega 3-6-9 Fatty Acids (OMEGA 3-6-9 COMPLEX PO) Take by mouth.     pantoprazole (PROTONIX) 40 MG tablet TAKE 1 TABLET BY MOUTH EVERY DAY 90 tablet 1   Red Yeast Rice Extract (RED YEAST RICE PO) Take 240 mg by mouth 2 (two) times daily.     thiamine (VITAMIN B-1) 100 MG tablet Take 100 mg by mouth daily.     TURMERIC PO Take 1 tablet by mouth daily. Takes a 1000 mg     UNABLE TO FIND Beet root     vitamin E 400 UNIT capsule Take 400 Units by mouth daily.     No current facility-administered medications on file prior to visit.    Social History   Socioeconomic History   Marital status: Married    Spouse name: Not on file   Number of children: Not on file   Years of education: Not on file   Highest education level: 12th grade  Occupational History   Not on file  Tobacco  Use   Smoking status: Never    Passive exposure: Never   Smokeless tobacco: Never  Vaping Use   Vaping status: Never Used  Substance and Sexual Activity   Alcohol use: Yes    Comment: occassionally-social   Drug use: No   Sexual activity: Not Currently    Partners: Male    Birth control/protection: Post-menopausal    Comment: 1st intercourse 77 yo-Fewer than 5 partners  Other Topics Concern   Not on file  Social History Narrative   Not on file   Social Determinants of Health   Financial Resource Strain: Low Risk  (03/21/2021)   Overall Financial Resource Strain (CARDIA)    Difficulty of Paying Living Expenses: Not hard at all  Food Insecurity: No Food Insecurity (07/15/2022)   Hunger Vital Sign    Worried About Running Out of Food in the Last Year: Never true    Ran Out of Food in the Last Year: Never true  Transportation Needs: No Transportation Needs (07/15/2022)   PRAPARE - Administrator, Civil Service (Medical): No    Lack of Transportation (Non-Medical): No  Physical Activity: Sufficiently Active (07/15/2022)   Exercise Vital Sign    Days of Exercise per Week: 5 days    Minutes of Exercise per Session: 50 min  Stress: No Stress Concern Present (07/15/2022)   [employer] of Occupational Health - Occupational Stress Questionnaire    Feeling of Stress : Not at all  Social Connections: Unknown (07/15/2022)   Social Connection and Isolation Panel [NHANES]    Frequency of Communication with Friends and Family: More than three times a week    Frequency of Social Gatherings with Friends and Family: More than three times a week    Attends Religious Services: Never    Database administrator or Organizations: Not on file    Attends Banker Meetings: Not on file    Marital Status: Married  Intimate Partner Violence: Not At Risk (04/13/2020)   Humiliation, Afraid, Rape, and Kick questionnaire    Fear of Current or Ex-Partner: No    Emotionally Abused: No     Physically Abused: No    Sexually Abused: No    Family History  Problem Relation Age of Onset   Pancreatic cancer Mother  Other Father        suicide   High blood pressure Brother    Breast cancer Maternal Grandmother        Age 22   Colon cancer Neg Hx    Esophageal cancer Neg Hx    Rectal cancer Neg Hx    Stomach cancer Neg Hx      Allergies  Allergen Reactions   Lisinopril Other (See Comments)    Cough      Patient's last menstrual period was No LMP recorded. Patient is postmenopausal..             Review of Systems Alls systems reviewed and are negative.     Physical Exam Constitutional:      Appearance: Normal appearance.  Genitourinary:     Vulva and urethral meatus normal.     No lesions in the vagina.     Genitourinary Comments: Possible small fibroid     Right Labia: No rash, lesions or skin changes.    Left Labia: No lesions, skin changes or rash.    No vaginal discharge or tenderness.     No vaginal prolapse present.    Moderate vaginal atrophy present.     Right Adnexa: not tender, not palpable and no mass present.    Left Adnexa: not tender, not palpable and no mass present.    No cervical motion tenderness or discharge.     Uterus is irregular.     Uterus is not enlarged or tender.     Uterus is anteverted.  Breasts:    Right: Normal.     Left: Normal.  HENT:     Head: Normocephalic.  Neck:     Thyroid: No thyroid mass, thyromegaly or thyroid tenderness.  Cardiovascular:     Rate and Rhythm: Normal rate and regular rhythm.     Heart sounds: Normal heart sounds, S1 normal and S2 normal.  Pulmonary:     Effort: Pulmonary effort is normal.     Breath sounds: Normal breath sounds and air entry.  Abdominal:     General: There is no distension.     Palpations: Abdomen is soft. There is no mass.     Tenderness: There is no abdominal tenderness. There is no guarding or rebound.  Musculoskeletal:        General: Normal range of motion.      Cervical back: Full passive range of motion without pain, normal range of motion and neck supple. No tenderness.     Right lower leg: No edema.     Left lower leg: No edema.  Neurological:     Mental Status: She is alert.  Skin:    General: Skin is warm.  Psychiatric:        Mood and Affect: Mood normal.        Behavior: Behavior normal.        Thought Content: Thought content normal.  Vitals and nursing note reviewed. Exam conducted with a chaperone present.       A:         Well Woman GYN exam                             P:        Pap smear not indicated Can stop testing. Low risk no abnormal pap smears Encouraged annual mammogram screening Colon cancer screening up-to-date DXA up-to-date repeat in 2025 Labs and immunizations to do with  PMD Discussed breast self exams Encouraged healthy lifestyle practices Encouraged Vit D and Calcium  To get tv US to evaluate for fibroids  No follow-ups on file.  Earley Favor

## 2022-12-19 ENCOUNTER — Encounter: Payer: Self-pay | Admitting: Obstetrics and Gynecology

## 2022-12-19 ENCOUNTER — Ambulatory Visit: Payer: Medicare HMO | Admitting: Obstetrics and Gynecology

## 2022-12-19 ENCOUNTER — Ambulatory Visit: Payer: Medicare HMO

## 2022-12-19 VITALS — BP 122/82 | HR 77 | Resp 16

## 2022-12-19 DIAGNOSIS — Z712 Person consulting for explanation of examination or test findings: Secondary | ICD-10-CM

## 2022-12-19 DIAGNOSIS — D219 Benign neoplasm of connective and other soft tissue, unspecified: Secondary | ICD-10-CM

## 2022-12-19 DIAGNOSIS — D259 Leiomyoma of uterus, unspecified: Secondary | ICD-10-CM | POA: Diagnosis not present

## 2022-12-19 DIAGNOSIS — Z01419 Encounter for gynecological examination (general) (routine) without abnormal findings: Secondary | ICD-10-CM

## 2022-12-20 DIAGNOSIS — L820 Inflamed seborrheic keratosis: Secondary | ICD-10-CM | POA: Diagnosis not present

## 2022-12-20 DIAGNOSIS — L570 Actinic keratosis: Secondary | ICD-10-CM | POA: Diagnosis not present

## 2022-12-20 DIAGNOSIS — Z85828 Personal history of other malignant neoplasm of skin: Secondary | ICD-10-CM | POA: Diagnosis not present

## 2022-12-20 DIAGNOSIS — L821 Other seborrheic keratosis: Secondary | ICD-10-CM | POA: Diagnosis not present

## 2022-12-20 DIAGNOSIS — L814 Other melanin hyperpigmentation: Secondary | ICD-10-CM | POA: Diagnosis not present

## 2022-12-20 DIAGNOSIS — Z85820 Personal history of malignant melanoma of skin: Secondary | ICD-10-CM | POA: Diagnosis not present

## 2022-12-20 NOTE — Progress Notes (Signed)
Patient presents for ultrasound results Doing well today. Ultrasound showing 6.06 cm uterus endometrial lining measuring 2.4 to millimeters normal left and right ovaries. Small fibroid measuring 1.73 x 1.16 cm No adnexal masses no free fluid   A/p [place] results Ultrasound report was discussed with patient small fibroid seen d with stable appearance normal ovaries.  Discussed conservative measures.  Endometrial lining normal as well.  Discussed to return with any postmenopausal bleeding in the future, if it presents. Patient agreed 10 minutes spent on reviewing records, imaging,  and one on one patient time and counseling patient and documentation Dr. Karma Greaser

## 2023-01-12 ENCOUNTER — Other Ambulatory Visit: Payer: Self-pay | Admitting: Family Medicine

## 2023-01-12 ENCOUNTER — Other Ambulatory Visit (INDEPENDENT_AMBULATORY_CARE_PROVIDER_SITE_OTHER): Payer: Medicare HMO

## 2023-01-12 DIAGNOSIS — R7309 Other abnormal glucose: Secondary | ICD-10-CM

## 2023-01-12 DIAGNOSIS — E785 Hyperlipidemia, unspecified: Secondary | ICD-10-CM | POA: Diagnosis not present

## 2023-01-12 DIAGNOSIS — I10 Essential (primary) hypertension: Secondary | ICD-10-CM

## 2023-01-12 LAB — LIPID PANEL
Cholesterol: 206 mg/dL — ABNORMAL HIGH (ref 0–200)
HDL: 57.3 mg/dL (ref >39.00)
LDL Cholesterol: 133 mg/dL — ABNORMAL HIGH (ref 0–99)
NonHDL: 148.29
Total CHOL/HDL Ratio: 4
Triglycerides: 75 mg/dL (ref 0.0–149.0)
VLDL: 15 mg/dL (ref 0.0–40.0)

## 2023-01-12 LAB — COMPREHENSIVE METABOLIC PANEL
ALT: 25 U/L (ref 0–35)
AST: 27 U/L (ref 0–37)
Albumin: 4.1 g/dL (ref 3.5–5.2)
Alkaline Phosphatase: 65 U/L (ref 39–117)
BUN: 21 mg/dL (ref 6–23)
CO2: 31 meq/L (ref 19–32)
Calcium: 9.5 mg/dL (ref 8.4–10.5)
Chloride: 100 meq/L (ref 96–112)
Creatinine, Ser: 0.92 mg/dL (ref 0.40–1.20)
GFR: 60.82 mL/min (ref >60.00)
Glucose, Bld: 105 mg/dL — ABNORMAL HIGH (ref 70–99)
Potassium: 3.6 meq/L (ref 3.5–5.1)
Sodium: 139 meq/L (ref 135–145)
Total Bilirubin: 1 mg/dL (ref 0.2–1.2)
Total Protein: 6.9 g/dL (ref 6.0–8.3)

## 2023-01-12 LAB — HEMOGLOBIN A1C: Hgb A1c MFr Bld: 5.7 % (ref 4.6–6.5)

## 2023-01-16 DIAGNOSIS — H524 Presbyopia: Secondary | ICD-10-CM | POA: Diagnosis not present

## 2023-01-16 DIAGNOSIS — H2513 Age-related nuclear cataract, bilateral: Secondary | ICD-10-CM | POA: Diagnosis not present

## 2023-01-19 ENCOUNTER — Ambulatory Visit (INDEPENDENT_AMBULATORY_CARE_PROVIDER_SITE_OTHER): Payer: Medicare HMO | Admitting: Family Medicine

## 2023-01-19 ENCOUNTER — Encounter: Payer: Self-pay | Admitting: Family Medicine

## 2023-01-19 ENCOUNTER — Telehealth: Payer: Self-pay | Admitting: Family Medicine

## 2023-01-19 ENCOUNTER — Other Ambulatory Visit: Payer: Self-pay | Admitting: Family Medicine

## 2023-01-19 VITALS — BP 102/70 | HR 72 | Temp 97.7°F | Ht <= 58 in | Wt 166.8 lb

## 2023-01-19 DIAGNOSIS — I10 Essential (primary) hypertension: Secondary | ICD-10-CM

## 2023-01-19 DIAGNOSIS — Z0000 Encounter for general adult medical examination without abnormal findings: Secondary | ICD-10-CM

## 2023-01-19 DIAGNOSIS — K219 Gastro-esophageal reflux disease without esophagitis: Secondary | ICD-10-CM

## 2023-01-19 DIAGNOSIS — E785 Hyperlipidemia, unspecified: Secondary | ICD-10-CM

## 2023-01-19 DIAGNOSIS — R7309 Other abnormal glucose: Secondary | ICD-10-CM

## 2023-01-19 MED ORDER — PANTOPRAZOLE SODIUM 40 MG PO TBEC: 40.0000 mg | DELAYED_RELEASE_TABLET | Freq: Every day | ORAL | 1 refills | Status: DC

## 2023-01-19 NOTE — Telephone Encounter (Signed)
Message sent to PCP as patient has an appt today at 11am.

## 2023-01-19 NOTE — Telephone Encounter (Signed)
Please send the order to [facility] long instead please!

## 2023-01-19 NOTE — Telephone Encounter (Signed)
Patient inquired if she can have her labs done prior to her physical on 01/22/2024.  Patient stated she had them done prior to this years physical.  Please advise at [phone].

## 2023-01-19 NOTE — Progress Notes (Signed)
Complete physical exam  Patient: Robin Francis   DOB: [birth year]   77 y.o. Female  MRN: [id number]  Subjective:    Chief Complaint  Patient presents with   Annual Exam    Robin Francis is a 77 y.o. female who presents today for a complete physical exam. She reports consuming a general diet. Home exercise routine includes walking 0.5 hrs per day and goes to the gym three days per week. She generally feels well. She reports sleeping gets around 7 hours, wakes up once to urinate, falls back asleep easily. She does not have additional problems to discuss today.    Most recent fall risk assessment:    01/19/2023   10:44 AM  Fall Risk   Falls in the past year? 0  Number falls in past yr: 0  Injury with Fall? 0  Risk for fall due to : No Fall Risks  Follow up Falls evaluation completed     Most recent depression screenings:    01/19/2023   10:44 AM 03/28/2022   10:26 AM  PHQ 2/9 Scores  PHQ - 2 Score 0 0  PHQ- 9 Score 0 0    Vision:Within last year and patient will be having cataract surgery, right eye, Dr. Cathey Endow will be doing the surgery and Dental: No current dental problems and Receives regular dental care  Patient Active Problem List   Diagnosis Date Noted   Healthy adult on routine physical examination 01/17/2022   Elevated glucose 07/10/2018   Hyperlipidemia 07/10/2018   Stricture and stenosis of esophagus 01/06/2013   Special screening for malignant neoplasms, colon 11/07/2012   Essential hypertension, benign 04/04/2012   Low bone mass    Postop Acute blood loss anemia 04/13/2011   OA (osteoarthritis) of knee 04/03/2011   MENOPAUSAL SYNDROME 09/17/2008   Osteoarthritis 09/17/2008      Patient Care Team: Karie Georges, MD as PCP - General (Family Medicine) Dermatology, Jacquelin Hawking, MD as Consulting Physician (Ophthalmology) Earley Favor, MD as Consulting Physician (Obstetrics and Gynecology)   Outpatient Medications Prior to  Visit  Medication Sig   amLODipine (NORVASC) 2.5 MG tablet TAKE 1 TABLET BY MOUTH EVERY DAY   Ascorbic Acid (VITAMIN C) 1000 MG tablet Take 1,000 mg by mouth daily.   calcium carbonate (OS-CAL) 600 MG TABS tablet Take 600 mg by mouth 2 (two) times daily with a meal.   cetirizine (ZYRTEC) 10 MG tablet Take 10 mg by mouth daily.   Cholecalciferol (VITAMIN D) 2000 units tablet Take 2,000 Units by mouth daily.   CINNAMON PO Take 1,000 mg by mouth daily.   Coenzyme Q10 (CO Q 10 PO) Take 120 mg by mouth.   losartan-hydrochlorothiazide (HYZAAR) 100-25 MG tablet Take 1 tablet by mouth daily.   Multiple Vitamin (MULTIVITAMIN) tablet Take 1 tablet by mouth daily.   Omega 3-6-9 Fatty Acids (OMEGA 3-6-9 COMPLEX PO) Take by mouth.   Red Yeast Rice Extract (RED YEAST RICE PO) Take 240 mg by mouth 2 (two) times daily.   thiamine (VITAMIN B-1) 100 MG tablet Take 100 mg by mouth daily.   TURMERIC PO Take 1 tablet by mouth daily. Takes a 1000 mg   UNABLE TO FIND Beet root   vitamin E 400 UNIT capsule Take 400 Units by mouth daily.   [DISCONTINUED] pantoprazole (PROTONIX) 40 MG tablet TAKE 1 TABLET BY MOUTH EVERY DAY   No facility-administered medications prior to visit.    Review of Systems  HENT:  Negative for hearing loss.   Eyes:  Negative for blurred vision.  Respiratory:  Negative for shortness of breath.   Cardiovascular:  Negative for chest pain.  Gastrointestinal: Negative.   Genitourinary: Negative.   Musculoskeletal:  Negative for back pain.  Neurological:  Negative for headaches.  Psychiatric/Behavioral:  Negative for depression.        Objective:     BP 102/70   Pulse 72   Temp 97.7 F (36.5 C) (Oral)   Ht 4' 8.5" (1.435 m)   Wt 166 lb 12.8 oz (75.7 kg)   SpO2 98%   BMI 36.74 kg/m    Physical Exam Vitals reviewed.  Constitutional:      Appearance: Normal appearance. She is well-groomed and normal weight.  HENT:     Right Ear: Tympanic membrane and ear canal normal.      Left Ear: Tympanic membrane and ear canal normal.     Mouth/Throat:     Mouth: Mucous membranes are moist.     Pharynx: No posterior oropharyngeal erythema.  Eyes:     Conjunctiva/sclera: Conjunctivae normal.  Neck:     Thyroid: No thyromegaly.  Cardiovascular:     Rate and Rhythm: Normal rate and regular rhythm.     Pulses: Normal pulses.     Heart sounds: S1 normal and S2 normal. Murmur (heard best over the RUSB but also RLSB) heard.     Systolic murmur is present with a grade of 2/6.  Pulmonary:     Effort: Pulmonary effort is normal.     Breath sounds: Normal breath sounds and air entry.  Abdominal:     General: Bowel sounds are normal.  Musculoskeletal:     Right lower leg: Edema present.     Left lower leg: Edema present.  Lymphadenopathy:     Cervical: No cervical adenopathy.  Neurological:     Mental Status: She is alert and oriented to person, place, and time. Mental status is at baseline.     Gait: Gait is intact.  Psychiatric:        Mood and Affect: Mood and affect normal.        Speech: Speech normal.        Behavior: Behavior normal.        Judgment: Judgment normal.      No results found for any visits on 01/19/23. Last metabolic panel Lab Results  Component Value Date   GLUCOSE 105 (H) 01/12/2023   NA 139 01/12/2023   K 3.6 01/12/2023   CL 100 01/12/2023   CO2 31 01/12/2023   BUN 21 01/12/2023   CREATININE 0.92 01/12/2023   GFR 60.82 01/12/2023   CALCIUM 9.5 01/12/2023   PROT 6.9 01/12/2023   ALBUMIN 4.1 01/12/2023   BILITOT 1.0 01/12/2023   ALKPHOS 65 01/12/2023   AST 27 01/12/2023   ALT 25 01/12/2023   ANIONGAP 11 04/20/2015   Last lipids Lab Results  Component Value Date   CHOL 206 (H) 01/12/2023   HDL 57.30 01/12/2023   LDLCALC 133 (H) 01/12/2023   LDLDIRECT 136.8 09/26/2012   TRIG 75.0 01/12/2023   CHOLHDL 4 01/12/2023   Last hemoglobin A1c Lab Results  Component Value Date   HGBA1C 5.7 01/12/2023    The 10-year ASCVD risk  score (Arnett DK, et al., 2019) is: 13.9%   Values used to calculate the score:     Age: 16 years     Sex: Female     Is Non-Hispanic African American: No  Diabetic: No     Tobacco smoker: No     Systolic Blood Pressure: 102 mmHg     Is BP treated: Yes     HDL Cholesterol: 57.3 mg/dL     Total Cholesterol: 206 mg/dL     Assessment & Plan:    Routine Health Maintenance and Physical Exam  Immunization History  Administered Date(s) Administered   Influenza, High Dose Seasonal PF 12/05/2013, 11/17/2014, 12/20/2015, 12/21/2016, 12/06/2017, 11/03/2022   Influenza,inj,Quad PF,6+ Mos 11/11/2018   Influenza-Unspecified 11/11/2019, 11/18/2020, 11/11/2021   Moderna Covid-19 Vaccine Bivalent Booster 35yrs & up 11/03/2022   PFIZER(Purple Top)SARS-COV-2 Vaccination 03/06/2019, 04/03/2019, 11/11/2019, 05/11/2020   PNEUMOCOCCAL CONJUGATE-20 01/07/2021   Pfizer Covid-19 Vaccine Bivalent Booster 72yrs & up 11/18/2020, 06/09/2021, 11/11/2021   Pneumococcal Conjugate-13 10/06/2013   Pneumococcal Polysaccharide-23 10/03/2012   Tdap 09/30/2010, 06/09/2021   Zoster Recombinant(Shingrix) 07/16/2017, 10/24/2017    Health Maintenance  Topic Date Due   COVID-19 Vaccine (9 - 2024-25 season) 12/29/2022   Medicare Annual Wellness (AWV)  03/29/2023   MAMMOGRAM  08/07/2023   Colonoscopy  01/10/2027   DTaP/Tdap/Td (3 - Td or Tdap) 06/10/2031   Pneumonia Vaccine 24+ Years old  Completed   INFLUENZA VACCINE  Completed   DEXA SCAN  Completed   Hepatitis C Screening  Completed   Zoster Vaccines- Shingrix  Completed   HPV VACCINES  Aged Out    Discussed health benefits of physical activity, and encouraged her to engage in regular exercise appropriate for her age and condition.  Hyperlipidemia, unspecified hyperlipidemia type -     CT CARDIAC SCORING (SELF PAY ONLY); Future -     Lipid panel; Future  Gastroesophageal reflux disease, unspecified whether esophagitis present -     Pantoprazole Sodium;  Take 1 tablet (40 mg total) by mouth daily.  Dispense: 90 tablet; Refill: 1  Routine general medical examination at a health care facility  Essential hypertension, benign -     Comprehensive metabolic panel; Future  Elevated glucose -     Hemoglobin A1c; Future  There is a very soft murmur heard on exam today, otherwise physical exam is normal, she is not having any symptoms at all of valvular dysfunction. I am ordering a CT calcium score to fully evaluate her cardiac risk and help her decide if a cholesterol medication is appropriate. RTC in 6 months for BP recheck.   Return in about 6 months (around 07/20/2023) for HTN.     Karie Georges, MD

## 2023-01-19 NOTE — Patient Instructions (Signed)

## 2023-01-19 NOTE — Telephone Encounter (Signed)
Orders placed for next year. 

## 2023-01-19 NOTE — Telephone Encounter (Signed)
Pt will need to reshedule her annual phsyical-- I will call this in for her in the meantime

## 2023-01-22 NOTE — Telephone Encounter (Signed)
Left a detailed message at the patient's cell number with the information below.   

## 2023-01-26 DIAGNOSIS — H2513 Age-related nuclear cataract, bilateral: Secondary | ICD-10-CM | POA: Diagnosis not present

## 2023-02-28 DIAGNOSIS — H2511 Age-related nuclear cataract, right eye: Secondary | ICD-10-CM | POA: Diagnosis not present

## 2023-02-28 DIAGNOSIS — H25812 Combined forms of age-related cataract, left eye: Secondary | ICD-10-CM | POA: Diagnosis not present

## 2023-03-19 ENCOUNTER — Ambulatory Visit (HOSPITAL_BASED_OUTPATIENT_CLINIC_OR_DEPARTMENT_OTHER)
Admission: RE | Admit: 2023-03-19 | Discharge: 2023-03-19 | Disposition: A | Discharge status: Home / Self Care | Payer: Self-pay | Source: Ambulatory Visit | Attending: Family Medicine | Admitting: Family Medicine

## 2023-03-19 ENCOUNTER — Encounter: Payer: Self-pay | Admitting: Family Medicine

## 2023-03-19 DIAGNOSIS — E785 Hyperlipidemia, unspecified: Secondary | ICD-10-CM | POA: Insufficient documentation

## 2023-04-03 ENCOUNTER — Ambulatory Visit (INDEPENDENT_AMBULATORY_CARE_PROVIDER_SITE_OTHER): Payer: Medicare HMO | Admitting: Family Medicine

## 2023-04-03 DIAGNOSIS — Z0000 Encounter for general adult medical examination without abnormal findings: Secondary | ICD-10-CM | POA: Diagnosis not present

## 2023-04-03 NOTE — Patient Instructions (Signed)
 I really enjoyed getting to talk with you today! I am available on Tuesdays and Thursdays for virtual visits if you have any questions or concerns, or if I can be of any further assistance.   CHECKLIST FROM ANNUAL WELLNESS VISIT:  -Follow up (please call to schedule if not scheduled after visit):   -yearly for annual wellness visit with primary care office  Here is a list of your preventive care/health maintenance measures and the plan for each if any are due:  PLAN For any measures below that may be due:   Health Maintenance  Topic Date Due   COVID-19 Vaccine (9 - 2024-25 season) 12/29/2022   MAMMOGRAM  08/07/2023   Medicare Annual Wellness (AWV)  04/02/2024   Colonoscopy  01/10/2027   DTaP/Tdap/Td (3 - Td or Tdap) 06/10/2031   Pneumonia Vaccine 59+ Years old  Completed   INFLUENZA VACCINE  Completed   DEXA SCAN  Completed   Hepatitis C Screening  Completed   Zoster Vaccines- Shingrix  Completed   HPV VACCINES  Aged Out    -See a dentist at least yearly  -Get your eyes checked and then per your eye specialist's recommendations  -Other issues addressed today:   - Alcohol: recommend no more than 1 alcoholic beverage per any 24 hour period  -I have included below further information regarding a healthy whole foods based diet, physical activity guidelines for adults, stress management and opportunities for social connections. I hope you find this information useful.   -----------------------------------------------------------------------------------------------------------------------------------------------------------------------------------------------------------------------------------------------------------    NUTRITION: -eat real food: lots of colorful vegetables (half the plate) and fruits -5-7 servings of vegetables and fruits per day (fresh or steamed is best), exp. 2 servings of vegetables with lunch and dinner and 2 servings of fruit per day. Berries and greens  such as kale and collards are great choices.  -consume on a regular basis:  fresh fruits, fresh veggies, fish, nuts, seeds, healthy oils (such as olive oil, avocado oil), whole grains (make sure for bread/pasta/crackers/etc., that the first ingredient on label contains the word "whole"), legumes. -can eat small amounts of dairy and lean meat (no larger than the palm of your hand), but avoid processed meats such as ham, bacon, lunch meat, etc. -drink water -try to avoid fast food and pre-packaged foods, processed meat, ultra processed foods/beverages (donuts, candy, etc.) -most experts advise limiting sodium to < 2300mg  per day, should limit further is any chronic conditions such as high blood pressure, heart disease, diabetes, etc. The American Heart Association advised that < 1500mg  is is ideal -try to avoid foods/beverages that contain any ingredients with names you do not recognize  -try to avoid foods/beverages  with added sugar or sweeteners/sweets  -try to avoid sweet drinks (including diet drinks): soda, juice, Gatorade, sweet tea, power drinks, diet drinks -try to avoid white rice, white bread, pasta (unless whole grain)  EXERCISE GUIDELINES FOR ADULTS: -if you wish to increase your physical activity, do so gradually and with the approval of your doctor -STOP and seek medical care immediately if you have any chest pain, chest discomfort or trouble breathing when starting or increasing exercise  -move and stretch your body, legs, feet and arms when sitting for long periods -Physical activity guidelines for optimal health in adults: -get at least 150 minutes per week of moderate exercise (can talk, but not sing); this is about 20-30 minutes of sustained activity 5-7 days per week or two 10-15 minute episodes of sustained activity 5-7 days per week -do  some muscle building/resistance training/strength training at least 2 days per week  -balance exercises 3+ days per week:   Stand somewhere  where you have something sturdy to hold onto if you lose balance    1) lift up on toes, then back down, start with 5x per day and work up to 20x   2) stand and lift one leg straight out to the side so that foot is a few inches of the floor, start with 5x each side and work up to 20x each side   3) stand on one foot, start with 5 seconds each side and work up to 20 seconds on each side  If you need ideas or help with getting more active:  -Silver sneakers https://tools.silversneakers.com  -Walk with a Doc: http://www.duncan-williams.com/  -try to include resistance (weight lifting/strength building) and balance exercises twice per week: or the following link for ideas: http://castillo-powell.com/  BuyDucts.dk  STRESS MANAGEMENT: -can try meditating, or just sitting quietly with deep breathing while intentionally relaxing all parts of your body for 5 minutes daily -if you need further help with stress, anxiety or depression please follow up with your primary doctor or contact the wonderful folks at [facility]: [phone]  SOCIAL CONNECTIONS: -options in [place] if you wish to engage in more social and exercise related activities:  -Silver sneakers https://tools.silversneakers.com  -Walk with a Doc: http://www.duncan-williams.com/  -Check out the [facility] Active Adults 50+ section on the [place] [employer] (hiking clubs, book clubs, cards and games, chess, exercise classes, aquatic classes and much more) - see the website for details: https://www.[place].gov/departments/parks-recreation/active-adults50  -YouTube has lots of exercise videos for different ages and abilities as well  -[facility] (a variety of indoor and outdoor inperson activities for adults). [phone]. [address]  -Virtual Online Classes (a variety of topics): see  seniorplanet.org or call [phone]  -consider volunteering at a school, hospice center, church, senior center or elsewhere

## 2023-04-03 NOTE — Progress Notes (Signed)
 PATIENT CHECK-IN and HEALTH RISK ASSESSMENT QUESTIONNAIRE:  -completed by phone/video for upcoming Medicare Preventive Visit  -PLEASE SELECT "NOT IN PERSON" for the method of visit.   FIRST check to see if the patient completed the online questionnaire - if so, this can be found under the "rooming tab", then go to the "questionnaires" tab. Some of the questions are the same and you can use the answers to complete all of the bold questions below before calling the patient. Question #s below: 6 (fall risk screening), under habits # 1, 2, 3, 8 and 9,  under everyday activities #2, 3 and 8.   Pre-Visit Check-in: 1)Vitals (height, wt, BP, etc) - record in vitals section for visit on day of visit Request home vitals (wt, BP, etc.) and enter into vitals, THEN update Vital Signs SmartPhrase below at the top of the HPI. See below.  2)Review and Update Medications, Allergies PMH, Surgeries, Social history in Epic 3)Hospitalizations in the last year with date/reason? none  4)Review and Update Care Team (patient's specialists) in Epic 5) Complete PHQ9 in Epic  6) Complete Fall Screening in Epic 7)Review all Health Maintenance Due and order under PCP if not done.  Medicare Wellness Patient Questionnaire:  Answer theses question about your habits: How often do you have a drink containing alcohol? On weekends a drink or 2 Have you ever smoked?no On average, how many days per week do you engage in moderate to strenuous exercise (like a brisk walk)?walks and goes to gym, 5 days/weekend, 50 minutes Typical diet:  thinks is ok, has veggies with dinner, typical, no a snacker, drinks mostly water, 2-3 cups of coffee  Answer theses question about your everyday activities: Can you perform most household chores?y Are you deaf or have significant trouble hearing?n Do you feel that you have a problem with memory?n Do you feel safe at home?y Last dentist visit? Goes on regular basis 8. Do you have any  difficulty performing your everyday activities?n Are you having any difficulty walking, taking medications on your own, and or difficulty managing daily home needs? Do you have difficulty walking or climbing stairs?n Do you have difficulty dressing or bathing?n Do you have difficulty doing errands alone such as visiting a doctor's office or shopping?n Do you currently have any difficulty preparing food and eating?n Do you currently have any difficulty using the toilet?n Do you have any difficulty managing your finances?n Do you have any difficulties with housekeeping of managing your housekeeping?n   Do you have Advanced Directives in place (Living Will, Healthcare Power or Attorney)? yes   Last eye Exam and location? yesterday   Do you currently use prescribed or non-prescribed narcotic or opioid pain medications? no  Do you have a history or close family history of breast, ovarian, tubal or peritoneal cancer or a family member with BRCA (breast cancer susceptibility 1 and 2) gene mutations?    ----------------------------------------------------------------------------------------------------------------------------------------------------------------------------------------------------------------------  Because this visit was a virtual/telehealth visit, some criteria may be missing or patient reported. Any vitals not documented were not able to be obtained and vitals that have been documented are patient reported.    MEDICARE ANNUAL PREVENTIVE VISIT WITH PROVIDER: (Welcome to Medicare, initial annual wellness or annual wellness exam)  Virtual Visit via Video Note  I connected with DELAILAH SPIETH on 04/03/23 by a video enabled telemedicine application and verified that I am speaking with the correct person using two identifiers.  Location patient: home Location provider:work or home office Persons participating in the  virtual visit: patient, provider  Concerns and/or follow  up today: doing well, no concerns. Had cataract surgery a month ago.    See HM section in Epic for other details of completed HM.    ROS: negative for report of fevers, unintentional weight loss, vision loss, hearing loss or change, chest pain, sob, hemoptysis, melena, hematochezia, hematuria, falls, bleeding or bruising, thoughts of suicide or self harm, memory loss  Patient-completed extensive health risk assessment - reviewed and discussed with the patient: See Health Risk Assessment completed with patient prior to the visit either above or in recent phone note. This was reviewed in detailed with the patient today and appropriate recommendations, orders and referrals were placed as needed per Summary below and patient instructions.   Review of Medical History: -PMH, PSH, Family History and current specialty and care providers reviewed and updated and listed below   Patient Care Team: Karie Georges, MD as PCP - General (Family Medicine) Dermatology, Jacquelin Hawking, MD as Consulting Physician (Ophthalmology) Earley Favor, MD as Consulting Physician (Obstetrics and Gynecology)   Past Medical History:  Diagnosis Date   Basal cell carcinoma (BCC) of buttock    Diverticulum of large intestine    GERD (gastroesophageal reflux disease)    Hypertension    OSTEOARTHRITIS 09/17/2008   Osteopenia 11/2018   T score -1.7 distal third of radius FRAX 8.9% / 1.1%    Past Surgical History:  Procedure Laterality Date   BACK SURGERY     LOWER   KNEE ARTHROSCOPY     left   PARTIAL KNEE ARTHROPLASTY Right 04/19/2015   Procedure: UNICOMPARTMENTAL KNEE MEDIAL REPLACMENT;  Surgeon: Ollen Gross, MD;  Location: [facility];  Service: Orthopedics;  Laterality: Right;   TONSILLECTOMY     as child   TOTAL KNEE ARTHROPLASTY  04/03/2011   Procedure: TOTAL KNEE ARTHROPLASTY;  Surgeon: Loanne Drilling, MD;  Location: [facility];  Service: Orthopedics;  Laterality: Left;   TRIGGER FINGER  RELEASE Left     Social History   Socioeconomic History   Marital status: Married    Spouse name: Not on file   Number of children: Not on file   Years of education: Not on file   Highest education level: Associate degree: occupational, Scientist, product/process development, or vocational program  Occupational History   Not on file  Tobacco Use   Smoking status: Never    Passive exposure: Never   Smokeless tobacco: Never  Vaping Use   Vaping status: Never Used  Substance and Sexual Activity   Alcohol use: Yes    Comment: occassionally-social   Drug use: No   Sexual activity: Not Currently    Partners: Male    Birth control/protection: Post-menopausal    Comment: 1st intercourse 78 yo-Fewer than 5 partners  Other Topics Concern   Not on file  Social History Narrative   Not on file   Social Drivers of Health   Financial Resource Strain: Low Risk  (03/30/2023)   Overall Financial Resource Strain (CARDIA)    Difficulty of Paying Living Expenses: Not hard at all  Food Insecurity: No Food Insecurity (03/30/2023)   Hunger Vital Sign    Worried About Running Out of Food in the Last Year: Never true    Ran Out of Food in the Last Year: Never true  Transportation Needs: No Transportation Needs (03/30/2023)   PRAPARE - Administrator, Civil Service (Medical): No    Lack of Transportation (Non-Medical): No  Physical Activity:  Sufficiently Active (03/30/2023)   Exercise Vital Sign    Days of Exercise per Week: 5 days    Minutes of Exercise per Session: 50 min  Stress: No Stress Concern Present (03/30/2023)   [employer] of Occupational Health - Occupational Stress Questionnaire    Feeling of Stress : Not at all  Social Connections: Moderately Isolated (03/30/2023)   Social Connection and Isolation Panel [NHANES]    Frequency of Communication with Friends and Family: More than three times a week    Frequency of Social Gatherings with Friends and Family: Twice a week    Attends Religious  Services: Never    Database administrator or Organizations: No    Attends Banker Meetings: Never    Marital Status: Married  Catering manager Violence: Not At Risk (01/19/2023)   Humiliation, Afraid, Rape, and Kick questionnaire    Fear of Current or Ex-Partner: No    Emotionally Abused: No    Physically Abused: No    Sexually Abused: No    Family History  Problem Relation Age of Onset   Pancreatic cancer Mother    Other Father        suicide   High blood pressure Brother    Breast cancer Maternal Grandmother        Age 22   Colon cancer Neg Hx    Esophageal cancer Neg Hx    Rectal cancer Neg Hx    Stomach cancer Neg Hx     Current Outpatient Medications on File Prior to Visit  Medication Sig Dispense Refill   amLODipine (NORVASC) 2.5 MG tablet TAKE 1 TABLET BY MOUTH EVERY DAY 90 tablet 0   Ascorbic Acid (VITAMIN C) 1000 MG tablet Take 1,000 mg by mouth daily.     calcium carbonate (OS-CAL) 600 MG TABS tablet Take 600 mg by mouth 2 (two) times daily with a meal.     cetirizine (ZYRTEC) 10 MG tablet Take 10 mg by mouth daily.     Cholecalciferol (VITAMIN D) 2000 units tablet Take 2,000 Units by mouth daily.     CINNAMON PO Take 1,000 mg by mouth daily.     Coenzyme Q10 (CO Q 10 PO) Take 120 mg by mouth.     losartan-hydrochlorothiazide (HYZAAR) 100-25 MG tablet Take 1 tablet by mouth daily. 90 tablet 3   Multiple Vitamin (MULTIVITAMIN) tablet Take 1 tablet by mouth daily.     Omega 3-6-9 Fatty Acids (OMEGA 3-6-9 COMPLEX PO) Take by mouth.     pantoprazole (PROTONIX) 40 MG tablet Take 1 tablet (40 mg total) by mouth daily. 90 tablet 1   Red Yeast Rice Extract (RED YEAST RICE PO) Take 240 mg by mouth 2 (two) times daily.     thiamine (VITAMIN B-1) 100 MG tablet Take 100 mg by mouth daily.     TURMERIC PO Take 1 tablet by mouth daily. Takes a 1000 mg     UNABLE TO FIND Beet root     vitamin E 400 UNIT capsule Take 400 Units by mouth daily.     No current  facility-administered medications on file prior to visit.    Allergies  Allergen Reactions   Lisinopril Other (See Comments)    Cough       Physical Exam Vitals requested from patient and listed below if patient had equipment and was able to obtain at home for this virtual visit: There were no vitals filed for this visit. Estimated body mass index is 36.74  kg/m as calculated from the following:   Height as of 01/19/23: 4' 8.5" (1.435 m).   Weight as of 01/19/23: 166 lb 12.8 oz (75.7 kg).  EKG (optional): deferred due to virtual visit  GENERAL: alert, oriented, no acute distress detected, full vision exam deferred due to pandemic and/or virtual encounter  HEENT: atraumatic, conjunttiva clear, no obvious abnormalities on inspection of external nose and ears  NECK: normal movements of the head and neck  LUNGS: on inspection no signs of respiratory distress, breathing rate appears normal, no obvious gross SOB, gasping or wheezing  CV: no obvious cyanosis  MS: moves all visible extremities without noticeable abnormality  PSYCH/NEURO: pleasant and cooperative, no obvious depression or anxiety, speech and thought processing grossly intact, Cognitive function grossly intact  Flowsheet Row Office Visit from [facility]/19/2023 in [facility] [place]  PHQ-9 Total Score 0           04/03/2023    3:03 PM 01/19/2023   10:44 AM 03/28/2022   10:26 AM 01/17/2022    9:24 AM 11/02/2021   12:06 PM  Depression screen PHQ 2/9  Decreased Interest 0 0 0 0 0  Down, Depressed, Hopeless 0 0 0 0 0  PHQ - 2 Score 0 0 0 0 0  Altered sleeping  0 0 0 0  Tired, decreased energy  0 0 0 0  Change in appetite  0 0 0 0  Feeling bad or failure about yourself   0 0 0 0  Trouble concentrating  0 0 0 0  Moving slowly or fidgety/restless  0 0 0 0  Suicidal thoughts  0 0 0 0  PHQ-9 Score  0 0 0 0  Difficult doing work/chores   Not difficult at all         07/19/2022   10:52 AM  12/06/2022    1:31 PM 01/19/2023   10:44 AM 03/30/2023   12:54 PM 04/03/2023    3:03 PM  Fall Risk  Falls in the past year? 0 0 0 0 0  Was there an injury with Fall? 0 0 0  0  Fall Risk Category Calculator 0 0 0  0  Patient at Risk for Falls Due to No Fall Risks No Fall Risks No Fall Risks    Fall risk Follow up Falls evaluation completed Falls evaluation completed Falls evaluation completed  Falls evaluation completed     SUMMARY AND PLAN:  Encounter for Medicare annual wellness exam   Discussed applicable health maintenance/preventive health measures and advised and referred or ordered per patient preferences: -advised of current recommendations for the updated covid19 vaccines, she can get at the pharmacy if she wishes -discussed next dexa due this fall - she does with gyn Health Maintenance  Topic Date Due   COVID-19 Vaccine (9 - 2024-25 season) 12/29/2022   MAMMOGRAM  08/07/2023   Medicare Annual Wellness (AWV)  04/02/2024   Colonoscopy  01/10/2027   DTaP/Tdap/Td (3 - Td or Tdap) 06/10/2031   Pneumonia Vaccine 36+ Years old  Completed   INFLUENZA VACCINE  Completed   DEXA SCAN  Completed   Hepatitis C Screening  Completed   Zoster Vaccines- Shingrix  Completed   HPV VACCINES  Aged [employer] and counseling on the following was provided based on the above review of health and a plan/checklist for the patient, along with additional information discussed, was provided for the patient in the patient instructions :   -  Reviewed and discussed exercise guidelines for adults with include balance exercises at least 3 days per week.  -Advised and counseled on a healthy lifestyle - including the importance of a healthy diet, regular physical activity, social connections  -Reviewed patient's current diet. Advised and counseled on a whole foods based healthy diet. A summary of a healthy diet was provided in the Patient Instructions. Encouraged to add more fruits and veggies  - 1-2 servings of fresh or frozen fruits per day and half the plate veggies with lunch and dinner.  -reviewed patient's current physical activity level and discussed exercise guidelines for adults. Congratulated on meeting guidelines.  -Advise yearly dental visits at minimum and regular eye exams -Advised and counseled on alcohol safe limits, advised no more than 1 alcoholic beverage per any given 24 hour period.  Follow up: see patient instructions     Patient Instructions  I really enjoyed getting to talk with you today! I am available on Tuesdays and Thursdays for virtual visits if you have any questions or concerns, or if I can be of any further assistance.   CHECKLIST FROM ANNUAL WELLNESS VISIT:  -Follow up (please call to schedule if not scheduled after visit):   -yearly for annual wellness visit with primary care office  Here is a list of your preventive care/health maintenance measures and the plan for each if any are due:  PLAN For any measures below that may be due:   Health Maintenance  Topic Date Due   COVID-19 Vaccine (9 - 2024-25 season) 12/29/2022   MAMMOGRAM  08/07/2023   Medicare Annual Wellness (AWV)  04/02/2024   Colonoscopy  01/10/2027   DTaP/Tdap/Td (3 - Td or Tdap) 06/10/2031   Pneumonia Vaccine 33+ Years old  Completed   INFLUENZA VACCINE  Completed   DEXA SCAN  Completed   Hepatitis C Screening  Completed   Zoster Vaccines- Shingrix  Completed   HPV VACCINES  Aged Out    -See a dentist at least yearly  -Get your eyes checked and then per your eye specialist's recommendations  -Other issues addressed today:   - Alcohol: recommend no more than 1 alcoholic beverage per any 24 hour period  -I have included below further information regarding a healthy whole foods based diet, physical activity guidelines for adults, stress management and opportunities for social connections. I hope you find this information useful.    -----------------------------------------------------------------------------------------------------------------------------------------------------------------------------------------------------------------------------------------------------------    NUTRITION: -eat real food: lots of colorful vegetables (half the plate) and fruits -5-7 servings of vegetables and fruits per day (fresh or steamed is best), exp. 2 servings of vegetables with lunch and dinner and 2 servings of fruit per day. Berries and greens such as kale and collards are great choices.  -consume on a regular basis:  fresh fruits, fresh veggies, fish, nuts, seeds, healthy oils (such as olive oil, avocado oil), whole grains (make sure for bread/pasta/crackers/etc., that the first ingredient on label contains the word "whole"), legumes. -can eat small amounts of dairy and lean meat (no larger than the palm of your hand), but avoid processed meats such as ham, bacon, lunch meat, etc. -drink water -try to avoid fast food and pre-packaged foods, processed meat, ultra processed foods/beverages (donuts, candy, etc.) -most experts advise limiting sodium to < 2300mg  per day, should limit further is any chronic conditions such as high blood pressure, heart disease, diabetes, etc. The American Heart Association advised that < 1500mg  is is ideal -try to avoid foods/beverages that contain any ingredients with  names you do not recognize  -try to avoid foods/beverages  with added sugar or sweeteners/sweets  -try to avoid sweet drinks (including diet drinks): soda, juice, Gatorade, sweet tea, power drinks, diet drinks -try to avoid white rice, white bread, pasta (unless whole grain)  EXERCISE GUIDELINES FOR ADULTS: -if you wish to increase your physical activity, do so gradually and with the approval of your doctor -STOP and seek medical care immediately if you have any chest pain, chest discomfort or trouble breathing when starting or  increasing exercise  -move and stretch your body, legs, feet and arms when sitting for long periods -Physical activity guidelines for optimal health in adults: -get at least 150 minutes per week of moderate exercise (can talk, but not sing); this is about 20-30 minutes of sustained activity 5-7 days per week or two 10-15 minute episodes of sustained activity 5-7 days per week -do some muscle building/resistance training/strength training at least 2 days per week  -balance exercises 3+ days per week:   Stand somewhere where you have something sturdy to hold onto if you lose balance    1) lift up on toes, then back down, start with 5x per day and work up to 20x   2) stand and lift one leg straight out to the side so that foot is a few inches of the floor, start with 5x each side and work up to 20x each side   3) stand on one foot, start with 5 seconds each side and work up to 20 seconds on each side  If you need ideas or help with getting more active:  -Silver sneakers https://tools.silversneakers.com  -Walk with a Doc: http://www.duncan-williams.com/  -try to include resistance (weight lifting/strength building) and balance exercises twice per week: or the following link for ideas: http://castillo-powell.com/  BuyDucts.dk  STRESS MANAGEMENT: -can try meditating, or just sitting quietly with deep breathing while intentionally relaxing all parts of your body for 5 minutes daily -if you need further help with stress, anxiety or depression please follow up with your primary doctor or contact the wonderful folks at [facility]: [phone]  SOCIAL CONNECTIONS: -options in [place] if you wish to engage in more social and exercise related activities:  -Silver sneakers https://tools.silversneakers.com  -Walk with a Doc: http://www.duncan-williams.com/  -Check out the [facility] Active Adults 50+  section on the [place] of [employer] (hiking clubs, book clubs, cards and games, chess, exercise classes, aquatic classes and much more) - see the website for details: https://www.[place].gov/departments/parks-recreation/active-adults50  -YouTube has lots of exercise videos for different ages and abilities as well  -[facility] (a variety of indoor and outdoor inperson activities for adults). 4343533743. [address].  -Virtual Online Classes (a variety of topics): see seniorplanet.org or call ([phone]  -consider volunteering at a school, hospice center, church, senior center or elsewhere            Terressa Koyanagi, DO

## 2023-04-06 DIAGNOSIS — H524 Presbyopia: Secondary | ICD-10-CM | POA: Diagnosis not present

## 2023-04-06 DIAGNOSIS — H52223 Regular astigmatism, bilateral: Secondary | ICD-10-CM | POA: Diagnosis not present

## 2023-04-19 ENCOUNTER — Other Ambulatory Visit: Payer: Self-pay | Admitting: Family Medicine

## 2023-04-19 DIAGNOSIS — I10 Essential (primary) hypertension: Secondary | ICD-10-CM

## 2023-07-08 ENCOUNTER — Other Ambulatory Visit: Payer: Self-pay | Admitting: Family Medicine

## 2023-07-08 DIAGNOSIS — K219 Gastro-esophageal reflux disease without esophagitis: Secondary | ICD-10-CM

## 2023-07-18 ENCOUNTER — Other Ambulatory Visit: Payer: Self-pay | Admitting: Family Medicine

## 2023-07-18 DIAGNOSIS — I10 Essential (primary) hypertension: Secondary | ICD-10-CM

## 2023-07-20 ENCOUNTER — Telehealth: Payer: Self-pay | Admitting: Obstetrics and Gynecology

## 2023-07-20 DIAGNOSIS — M8580 Other specified disorders of bone density and structure, unspecified site: Secondary | ICD-10-CM

## 2023-07-20 NOTE — Telephone Encounter (Signed)
 Needs f/u bone scan Dr. Tia Flowers

## 2023-08-08 DIAGNOSIS — Z1231 Encounter for screening mammogram for malignant neoplasm of breast: Secondary | ICD-10-CM | POA: Diagnosis not present

## 2023-08-08 LAB — HM MAMMOGRAPHY

## 2023-09-06 ENCOUNTER — Ambulatory Visit (HOSPITAL_BASED_OUTPATIENT_CLINIC_OR_DEPARTMENT_OTHER)
Admission: RE | Admit: 2023-09-06 | Discharge: 2023-09-06 | Disposition: A | Discharge status: Home / Self Care | Source: Ambulatory Visit | Attending: Obstetrics and Gynecology | Admitting: Obstetrics and Gynecology

## 2023-09-06 DIAGNOSIS — M8580 Other specified disorders of bone density and structure, unspecified site: Secondary | ICD-10-CM

## 2023-09-18 ENCOUNTER — Other Ambulatory Visit: Payer: Self-pay | Admitting: Family Medicine

## 2023-09-18 DIAGNOSIS — I10 Essential (primary) hypertension: Secondary | ICD-10-CM

## 2023-10-02 ENCOUNTER — Other Ambulatory Visit: Payer: Self-pay | Admitting: Family Medicine

## 2023-10-02 DIAGNOSIS — K219 Gastro-esophageal reflux disease without esophagitis: Secondary | ICD-10-CM

## 2023-10-02 DIAGNOSIS — H11003 Unspecified pterygium of eye, bilateral: Secondary | ICD-10-CM | POA: Diagnosis not present

## 2023-10-02 DIAGNOSIS — Z961 Presence of intraocular lens: Secondary | ICD-10-CM | POA: Diagnosis not present

## 2023-10-09 ENCOUNTER — Other Ambulatory Visit: Payer: Self-pay | Admitting: Family Medicine

## 2023-10-09 DIAGNOSIS — K219 Gastro-esophageal reflux disease without esophagitis: Secondary | ICD-10-CM

## 2023-11-15 ENCOUNTER — Other Ambulatory Visit: Payer: Self-pay | Admitting: Family Medicine

## 2023-11-15 DIAGNOSIS — I10 Essential (primary) hypertension: Secondary | ICD-10-CM

## 2023-12-10 ENCOUNTER — Encounter: Payer: Self-pay | Admitting: Obstetrics and Gynecology

## 2023-12-10 ENCOUNTER — Ambulatory Visit (INDEPENDENT_AMBULATORY_CARE_PROVIDER_SITE_OTHER): Payer: Medicare HMO | Admitting: Obstetrics and Gynecology

## 2023-12-10 VITALS — BP 122/74 | HR 87 | Ht <= 58 in | Wt 169.2 lb

## 2023-12-10 DIAGNOSIS — E669 Obesity, unspecified: Secondary | ICD-10-CM

## 2023-12-10 DIAGNOSIS — N952 Postmenopausal atrophic vaginitis: Secondary | ICD-10-CM

## 2023-12-10 DIAGNOSIS — M542 Cervicalgia: Secondary | ICD-10-CM

## 2023-12-10 DIAGNOSIS — Z01419 Encounter for gynecological examination (general) (routine) without abnormal findings: Secondary | ICD-10-CM | POA: Diagnosis not present

## 2023-12-10 DIAGNOSIS — M8580 Other specified disorders of bone density and structure, unspecified site: Secondary | ICD-10-CM

## 2023-12-10 NOTE — Progress Notes (Unsigned)
 78 y.o. y.o. female here for medicare annual exam. No LMP recorded. Patient is postmenopausal.    Does complain of neck pain after massage and still hurting after 2 weeks  No focal neurological deficits  Ultrasound showing 6.06 cm uterus endometrial lining measuring 2.4 to millimeters normal left and right ovaries. Small fibroid measuring 1.73 x 1.16 cm No adnexal masses no free fluid   G2P2L2  Married. Not sexually active  4 grand-children.   RP:  Established patient presenting for annual gyn exam and management of Osteopenia    HPI: Postmenopausal, well on no HRT.  No PMB.  No pelvic pain. Pap smear 11/2021.  No significant history of abnormal Pap smears.  Pap reflex today.  Mammogram 7/25 Negative. Colonoscopy 12/23, repeat Colono done in 01/2022-one polyp multiple diverticulum. Osteopenia. DEXA 11/2018, T score -1.7 at Lt 1/3 Forearm, FRAX 8.9% / 1.1%.  Repeat BD 2023 stable -1.5 osteopenia no h/o fractures next one 12/20/23 Body mass index is 37.27 kg/m.    Blood pressure 122/74, pulse 87, height 4' 8.5 (1.435 m), weight 169 lb 3.2 oz (76.7 kg), SpO2 97%.     Component Value Date/Time   DIAGPAP  11/24/2021 0951    - Negative for intraepithelial lesion or malignancy (NILM)   ADEQPAP  11/24/2021 0951    Satisfactory for evaluation; transformation zone component PRESENT.    GYN HISTORY:    Component Value Date/Time   DIAGPAP  11/24/2021 0951    - Negative for intraepithelial lesion or malignancy (NILM)   ADEQPAP  11/24/2021 0951    Satisfactory for evaluation; transformation zone component PRESENT.    OB History  Gravida Para Term Preterm AB Living  2 2 2   2   SAB IAB Ectopic Multiple Live Births          # Outcome Date GA Lbr Len/2nd Weight Sex Type Anes PTL Lv  2 Term           1 Term             Past Medical History:  Diagnosis Date   Basal cell carcinoma (BCC) of buttock    Diverticulum of large intestine    GERD (gastroesophageal reflux disease)     Hypertension    OSTEOARTHRITIS 09/17/2008   Osteopenia 11/2018   T score -1.7 distal third of radius FRAX 8.9% / 1.1%    Past Surgical History:  Procedure Laterality Date   BACK SURGERY     LOWER   KNEE ARTHROSCOPY     left   PARTIAL KNEE ARTHROPLASTY Right 04/19/2015   Procedure: UNICOMPARTMENTAL KNEE MEDIAL REPLACMENT;  Surgeon: Dempsey Moan, MD;  Location: [facility];  Service: Orthopedics;  Laterality: Right;   TONSILLECTOMY     as child   TOTAL KNEE ARTHROPLASTY  04/03/2011   Procedure: TOTAL KNEE ARTHROPLASTY;  Surgeon: Dempsey LULLA Moan, MD;  Location: [facility];  Service: Orthopedics;  Laterality: Left;   TRIGGER FINGER RELEASE Left     Current Outpatient Medications on File Prior to Visit  Medication Sig Dispense Refill   amLODipine  (NORVASC ) 2.5 MG tablet TAKE 1 TABLET BY MOUTH EVERY DAY 90 tablet 0   Ascorbic Acid (VITAMIN C) 1000 MG tablet Take 1,000 mg by mouth daily.     calcium carbonate (OS-CAL) 600 MG TABS tablet Take 600 mg by mouth 2 (two) times daily with a meal.     cetirizine (ZYRTEC) 10 MG tablet Take 10 mg by mouth daily.     Cholecalciferol (  VITAMIN D ) 2000 units tablet Take 2,000 Units by mouth daily.     CINNAMON PO Take 1,000 mg by mouth daily.     Coenzyme Q10 (CO Q 10 PO) Take 120 mg by mouth.     losartan -hydrochlorothiazide  (HYZAAR) 100-25 MG tablet TAKE 1 TABLET BY MOUTH EVERY DAY 90 tablet 1   Multiple Vitamin (MULTIVITAMIN) tablet Take 1 tablet by mouth daily.     Omega 3-6-9 Fatty Acids (OMEGA 3-6-9 COMPLEX PO) Take by mouth.     pantoprazole  (PROTONIX ) 40 MG tablet TAKE 1 TABLET BY MOUTH EVERY DAY 30 tablet 0   Red Yeast Rice Extract (RED YEAST RICE PO) Take 240 mg by mouth 2 (two) times daily.     thiamine (VITAMIN B-1) 100 MG tablet Take 100 mg by mouth daily.     TURMERIC PO Take 1 tablet by mouth daily. Takes a 1000 mg     UNABLE TO FIND Beet root     vitamin E 400 UNIT capsule Take 400 Units by mouth daily.     No current  facility-administered medications on file prior to visit.    Social History   Socioeconomic History   Marital status: Married    Spouse name: Not on file   Number of children: Not on file   Years of education: Not on file   Highest education level: Associate degree: occupational, scientist, product/process development, or vocational program  Occupational History   Not on file  Tobacco Use   Smoking status: Never    Passive exposure: Never   Smokeless tobacco: Never  Vaping Use   Vaping status: Never Used  Substance and Sexual Activity   Alcohol  use: Yes    Comment: occassionally-social   Drug use: No   Sexual activity: Not Currently    Partners: Male    Birth control/protection: Post-menopausal    Comment: 1st intercourse 78 yo-Fewer than 5 partners  Other Topics Concern   Not on file  Social History Narrative   Not on file   Social Drivers of Health   Financial Resource Strain: Low Risk  (03/30/2023)   Overall Financial Resource Strain (CARDIA)    Difficulty of Paying Living Expenses: Not hard at all  Food Insecurity: No Food Insecurity (03/30/2023)   Hunger Vital Sign    Worried About Running Out of Food in the Last Year: Never true    Ran Out of Food in the Last Year: Never true  Transportation Needs: No Transportation Needs (03/30/2023)   PRAPARE - Administrator, Civil Service (Medical): No    Lack of Transportation (Non-Medical): No  Physical Activity: Sufficiently Active (03/30/2023)   Exercise Vital Sign    Days of Exercise per Week: 5 days    Minutes of Exercise per Session: 50 min  Stress: No Stress Concern Present (03/30/2023)   [employer] of Occupational Health - Occupational Stress Questionnaire    Feeling of Stress : Not at all  Social Connections: Moderately Isolated (03/30/2023)   Social Connection and Isolation Panel    Frequency of Communication with Friends and Family: More than three times a week    Frequency of Social Gatherings with Friends and Family:  Twice a week    Attends Religious Services: Never    Database Administrator or Organizations: No    Attends Banker Meetings: Never    Marital Status: Married  Catering Manager Violence: Not At Risk (01/19/2023)   Humiliation, Afraid, Rape, and Kick questionnaire  Fear of Current or Ex-Partner: No    Emotionally Abused: No    Physically Abused: No    Sexually Abused: No    Family History  Problem Relation Age of Onset   Pancreatic cancer Mother    Other Father        suicide   High blood pressure Brother    Breast cancer Maternal Grandmother        Age 20   Colon cancer Neg Hx    Esophageal cancer Neg Hx    Rectal cancer Neg Hx    Stomach cancer Neg Hx      Allergies  Allergen Reactions   Lisinopril  Other (See Comments)    Cough      Patient's last menstrual period was No LMP recorded. Patient is postmenopausal..            Review of Systems Alls systems reviewed and are negative.     Physical Exam Constitutional:      Appearance: Normal appearance.  Genitourinary:     Vulva and urethral meatus normal.     No lesions in the vagina.     Right Labia: No rash, lesions or skin changes.    Left Labia: No lesions, skin changes or rash.    No vaginal discharge or tenderness.     No vaginal prolapse present.    Mild vaginal atrophy present.     Right Adnexa: not tender, not palpable and no mass present.    Left Adnexa: not tender, not palpable and no mass present.    No cervical motion tenderness or discharge.     Uterus is not enlarged, tender or irregular.  Breasts:    Right: Normal.     Left: Normal.  HENT:     Head: Normocephalic.  Neck:     Thyroid : No thyroid  mass, thyromegaly or thyroid  tenderness.  Cardiovascular:     Rate and Rhythm: Normal rate and regular rhythm.     Heart sounds: Normal heart sounds, S1 normal and S2 normal.  Pulmonary:     Effort: Pulmonary effort is normal.     Breath sounds: Normal breath sounds and air  entry.  Abdominal:     General: There is no distension.     Palpations: Abdomen is soft. There is no mass.     Tenderness: There is no abdominal tenderness. There is no guarding or rebound.  Musculoskeletal:        General: Normal range of motion.     Cervical back: Full passive range of motion without pain, normal range of motion and neck supple. No tenderness.     Right lower leg: No edema.     Left lower leg: No edema.  Neurological:     Mental Status: She is alert.  Skin:    General: Skin is warm.  Psychiatric:        Mood and Affect: Mood normal.        Behavior: Behavior normal.        Thought Content: Thought content normal.  Vitals and nursing note reviewed. Exam conducted with a chaperone present.    Geni, CMA was present for the entire exam   A:         Well Woman GYN exam osteopenia             Neck pain after massage that has not resolved after 2 weeks                P:  Pap smear not indicated Encouraged annual mammogram screening Colon cancer screening up-to-date DXA ordered today Labs and immunizations to do with PMD Discussed breast self exams Encouraged healthy lifestyle practices Encouraged Vit D and Calcium  Referral to ortho for neck pain and for studies with them  No follow-ups on file.  Almarie MARLA Carpen

## 2023-12-14 DIAGNOSIS — M542 Cervicalgia: Secondary | ICD-10-CM | POA: Diagnosis not present

## 2023-12-17 ENCOUNTER — Other Ambulatory Visit (HOSPITAL_BASED_OUTPATIENT_CLINIC_OR_DEPARTMENT_OTHER)

## 2023-12-20 ENCOUNTER — Ambulatory Visit (HOSPITAL_BASED_OUTPATIENT_CLINIC_OR_DEPARTMENT_OTHER)
Admission: RE | Admit: 2023-12-20 | Discharge: 2023-12-20 | Disposition: A | Discharge status: Home / Self Care | Source: Ambulatory Visit | Attending: Obstetrics and Gynecology | Admitting: Obstetrics and Gynecology

## 2023-12-20 DIAGNOSIS — M8589 Other specified disorders of bone density and structure, multiple sites: Secondary | ICD-10-CM | POA: Insufficient documentation

## 2023-12-20 DIAGNOSIS — M8580 Other specified disorders of bone density and structure, unspecified site: Secondary | ICD-10-CM | POA: Diagnosis not present

## 2023-12-21 ENCOUNTER — Ambulatory Visit: Payer: Self-pay | Admitting: Obstetrics and Gynecology

## 2023-12-27 DIAGNOSIS — Z85828 Personal history of other malignant neoplasm of skin: Secondary | ICD-10-CM | POA: Diagnosis not present

## 2023-12-27 DIAGNOSIS — L814 Other melanin hyperpigmentation: Secondary | ICD-10-CM | POA: Diagnosis not present

## 2023-12-27 DIAGNOSIS — D225 Melanocytic nevi of trunk: Secondary | ICD-10-CM | POA: Diagnosis not present

## 2023-12-27 DIAGNOSIS — L821 Other seborrheic keratosis: Secondary | ICD-10-CM | POA: Diagnosis not present

## 2024-01-02 ENCOUNTER — Other Ambulatory Visit: Payer: Self-pay | Admitting: Family Medicine

## 2024-01-02 DIAGNOSIS — I10 Essential (primary) hypertension: Secondary | ICD-10-CM

## 2024-01-21 ENCOUNTER — Encounter: Payer: Medicare HMO | Admitting: Family Medicine

## 2024-01-22 ENCOUNTER — Encounter: Payer: Self-pay | Admitting: Family Medicine

## 2024-01-22 ENCOUNTER — Ambulatory Visit: Payer: Medicare HMO | Admitting: Family Medicine

## 2024-01-22 VITALS — BP 116/82 | HR 88 | Temp 97.5°F | Ht <= 58 in | Wt 168.9 lb

## 2024-01-22 DIAGNOSIS — Z0000 Encounter for general adult medical examination without abnormal findings: Secondary | ICD-10-CM | POA: Diagnosis not present

## 2024-01-22 DIAGNOSIS — K219 Gastro-esophageal reflux disease without esophagitis: Secondary | ICD-10-CM | POA: Diagnosis not present

## 2024-01-22 DIAGNOSIS — E785 Hyperlipidemia, unspecified: Secondary | ICD-10-CM | POA: Diagnosis not present

## 2024-01-22 DIAGNOSIS — R7309 Other abnormal glucose: Secondary | ICD-10-CM | POA: Diagnosis not present

## 2024-01-22 DIAGNOSIS — I10 Essential (primary) hypertension: Secondary | ICD-10-CM | POA: Diagnosis not present

## 2024-01-22 LAB — COMPREHENSIVE METABOLIC PANEL WITH GFR
ALT: 24 U/L (ref 3–35)
AST: 27 U/L (ref 5–37)
Albumin: 4.3 g/dL (ref 3.5–5.2)
Alkaline Phosphatase: 75 U/L (ref 39–117)
BUN: 15 mg/dL (ref 6–23)
CO2: 31 meq/L (ref 19–32)
Calcium: 10 mg/dL (ref 8.4–10.5)
Chloride: 101 meq/L (ref 96–112)
Creatinine, Ser: 0.73 mg/dL (ref 0.40–1.20)
GFR: 79.71 mL/min (ref >60.00)
Glucose, Bld: 92 mg/dL (ref 70–99)
Potassium: 3.8 meq/L (ref 3.5–5.1)
Sodium: 140 meq/L (ref 135–145)
Total Bilirubin: 0.7 mg/dL (ref 0.2–1.2)
Total Protein: 7.1 g/dL (ref 6.0–8.3)

## 2024-01-22 LAB — HEMOGLOBIN A1C: Hgb A1c MFr Bld: 5.6 % (ref 4.6–6.5)

## 2024-01-22 LAB — LIPID PANEL
Cholesterol: 226 mg/dL — ABNORMAL HIGH (ref 28–200)
HDL: 61.6 mg/dL (ref >39.00)
LDL Cholesterol: 146 mg/dL — ABNORMAL HIGH (ref 10–99)
NonHDL: 163.93
Total CHOL/HDL Ratio: 4
Triglycerides: 90 mg/dL (ref 10.0–149.0)
VLDL: 18 mg/dL (ref 0.0–40.0)

## 2024-01-22 MED ORDER — AMLODIPINE BESYLATE 2.5 MG PO TABS: 2.5000 mg | ORAL_TABLET | Freq: Every day | ORAL | 1 refills | Status: AC

## 2024-01-22 MED ORDER — LOSARTAN POTASSIUM-HCTZ 100-25 MG PO TABS: 1.0000 | ORAL_TABLET | Freq: Every day | ORAL | 1 refills | Status: AC

## 2024-01-22 MED ORDER — PANTOPRAZOLE SODIUM 40 MG PO TBEC: 40.0000 mg | DELAYED_RELEASE_TABLET | Freq: Every day | ORAL | 1 refills | Status: AC

## 2024-01-22 NOTE — Patient Instructions (Signed)

## 2024-01-22 NOTE — Assessment & Plan Note (Signed)
 Co morbid conditions are HLD and HTN, recommended she increase green veggies in her diet and reduce starches. Checkings A1C today

## 2024-01-22 NOTE — Progress Notes (Signed)
 Complete physical exam  Patient: Robin Francis   DOB: [birth year]   78 y.o. Female  MRN: [id number]  Subjective:    Chief Complaint  Patient presents with   Medicare Wellness    [name] is a 78 y.o. female who presents today for a complete physical exam. She reports consuming a general diet. Doesn't always eat fruits and veggies, does get protein daily. States she does snack on things like potato chips,. Home exercise routine includes walking 3 hrs per week. She generally feels well. She reports sleeping well. She does not have additional problems to discuss today.    Most recent fall risk assessment:    01/22/2024    9:38 AM  Fall Risk   Falls in the past year? 0  Number falls in past yr: 0  Injury with Fall? 0  Risk for fall due to : No Fall Risks  Follow up Falls evaluation completed     Most recent depression screenings:    01/22/2024    9:39 AM 04/03/2023    3:03 PM  PHQ 2/9 Scores  PHQ - 2 Score 0 0    Vision:Within last year and Dental: No current dental problems and Receives regular dental care  Patient Active Problem List   Diagnosis Date Noted   Morbid (severe) obesity due to excess calories (HCC) 01/22/2024   Healthy adult on routine physical examination 01/17/2022   Elevated glucose 07/10/2018   Hyperlipidemia 07/10/2018   Stricture and stenosis of esophagus 01/06/2013   Special screening for malignant neoplasms, colon 11/07/2012   Essential hypertension, benign 04/04/2012   Low bone mass    Postop Acute blood loss anemia 04/13/2011   OA (osteoarthritis) of knee 04/03/2011   MENOPAUSAL SYNDROME 09/17/2008   Osteoarthritis 09/17/2008      Patient Care Team: Ozell Heron HERO, MD as PCP - General (Family Medicine) Dermatology, Ruthellen Waylan Cain, MD as Consulting Physician (Ophthalmology) Glennon Almarie POUR, MD as Consulting Physician (Obstetrics and Gynecology)   Show/hide medication list[1]  Review of Systems  HENT:   Negative for hearing loss.   Eyes:  Negative for blurred vision.  Respiratory:  Negative for shortness of breath.   Cardiovascular:  Negative for chest pain.  Gastrointestinal: Negative.   Genitourinary: Negative.   Musculoskeletal:  Negative for back pain.  Neurological:  Negative for headaches.  Psychiatric/Behavioral:  Negative for depression.        Objective:     BP 116/82   Pulse 88   Temp (!) 97.5 F (36.4 C) (Axillary)   Ht 4' 8.5 (1.435 m)   Wt 168 lb 14.4 oz (76.6 kg)   SpO2 98%   BMI 37.20 kg/m    Physical Exam Vitals reviewed.  Constitutional:      Appearance: Normal appearance. She is well-groomed. She is obese.  HENT:     Right Ear: Tympanic membrane and ear canal normal.     Left Ear: Tympanic membrane and ear canal normal.     Mouth/Throat:     Mouth: Mucous membranes are moist.     Pharynx: No posterior oropharyngeal erythema.  Eyes:     Conjunctiva/sclera: Conjunctivae normal.  Neck:     Thyroid : No thyromegaly.  Cardiovascular:     Rate and Rhythm: Normal rate and regular rhythm.     Pulses: Normal pulses.     Heart sounds: S1 normal and S2 normal.  Pulmonary:     Effort: Pulmonary effort is normal.     Breath  sounds: Normal breath sounds and air entry.  Abdominal:     General: Abdomen is flat. Bowel sounds are normal.     Palpations: Abdomen is soft.  Musculoskeletal:     Right lower leg: No edema.     Left lower leg: No edema.  Lymphadenopathy:     Cervical: No cervical adenopathy.  Neurological:     Mental Status: She is alert and oriented to person, place, and time. Mental status is at baseline.     Gait: Gait is intact.  Psychiatric:        Mood and Affect: Mood and affect normal.        Speech: Speech normal.        Behavior: Behavior normal.        Judgment: Judgment normal.      No results found for any visits on 01/22/24.     Assessment & Plan:    Routine Health Maintenance and Physical Exam  Immunization History   Administered Date(s) Administered   INFLUENZA, HIGH DOSE SEASONAL PF 12/05/2013, 11/17/2014, 12/20/2015, 12/21/2016, 12/06/2017, 11/03/2022   Influenza,inj,Quad PF,6+ Mos 11/11/2018   Influenza-Unspecified 11/11/2019, 11/18/2020, 11/11/2021, 10/08/2023   Moderna Covid-19 Vaccine Bivalent Booster 56yrs & up 11/03/2022   PFIZER(Purple Top)SARS-COV-2 Vaccination 03/06/2019, 04/03/2019, 11/11/2019, 05/11/2020   PNEUMOCOCCAL CONJUGATE-20 01/07/2021   Pfizer Covid-19 Vaccine Bivalent Booster 28yrs & up 11/18/2020, 06/09/2021, 11/11/2021   Pneumococcal Conjugate-13 10/06/2013   Pneumococcal Polysaccharide-23 10/03/2012   RSV,unspecified 09/21/2023   Tdap 09/30/2010, 06/09/2021   Unspecified SARS-COV-2 Vaccination 10/08/2023   Zoster Recombinant(Shingrix) 07/16/2017, 10/24/2017    Health Maintenance  Topic Date Due   Medicare Annual Wellness (AWV)  04/02/2024   COVID-19 Vaccine (10 - 2025-26 season) 04/06/2024   Mammogram  08/07/2024   Colonoscopy  01/10/2027   DTaP/Tdap/Td (3 - Td or Tdap) 06/10/2031   Pneumococcal Vaccine: 50+ Years  Completed   Influenza Vaccine  Completed   Bone Density Scan  Completed   Hepatitis C Screening  Completed   Zoster Vaccines- Shingrix  Completed   Meningococcal B Vaccine  Aged Out    Discussed health benefits of physical activity, and encouraged her to engage in regular exercise appropriate for her age and condition.  Routine general medical examination at a health care facility  Gastroesophageal reflux disease, unspecified whether esophagitis present -     Pantoprazole  Sodium; Take 1 tablet (40 mg total) by mouth daily.  Dispense: 90 tablet; Refill: 1  Essential hypertension, benign -     Losartan  Potassium-HCTZ; Take 1 tablet by mouth daily.  Dispense: 90 tablet; Refill: 1 -     amLODIPine  Besylate; Take 1 tablet (2.5 mg total) by mouth daily.  Dispense: 90 tablet; Refill: 1 -     Comprehensive metabolic panel with GFR; Future  Hyperlipidemia,  unspecified hyperlipidemia type -     Lipid panel; Future  Elevated glucose -     Hemoglobin A1c; Future  Morbid (severe) obesity due to excess calories [facility]) Assessment & Plan: Co morbid conditions are HLD and HTN, recommended she increase green veggies in her diet and reduce starches. Checkings A1C today    General physical exam findings are normal today. I reviewed the patient's preventative testing, immunizations, and lifestyle habits. I made appropriate recommendations and placed orders for the appropriate tests and/or vaccinations. I counseled the patient on the CDC's recommendations for healthy exercise and diet. I counseled the patient on healthy sleep habits and stress management. Handouts to reinforce the counseling were given at the conclusion  of the visit.    Return in 1 year (on 01/21/2025) for annual physical exam.     Heron CHRISTELLA Sharper, MD     [1]  Outpatient Medications Prior to Visit  Medication Sig   Ascorbic Acid (VITAMIN C) 1000 MG tablet Take 1,000 mg by mouth daily.   calcium carbonate (OS-CAL) 600 MG TABS tablet Take 600 mg by mouth 2 (two) times daily with a meal.   cetirizine (ZYRTEC) 10 MG tablet Take 10 mg by mouth daily.   Cholecalciferol (VITAMIN D ) 2000 units tablet Take 2,000 Units by mouth daily.   CINNAMON PO Take 1,000 mg by mouth daily.   Coenzyme Q10 (CO Q 10 PO) Take 120 mg by mouth.   Multiple Vitamin (MULTIVITAMIN) tablet Take 1 tablet by mouth daily.   Omega 3-6-9 Fatty Acids (OMEGA 3-6-9 COMPLEX PO) Take by mouth.   Red Yeast Rice Extract (RED YEAST RICE PO) Take 240 mg by mouth 2 (two) times daily.   thiamine (VITAMIN B-1) 100 MG tablet Take 100 mg by mouth daily.   TURMERIC PO Take 1 tablet by mouth daily. Takes a 1000 mg   UNABLE TO FIND Beet root   vitamin E 400 UNIT capsule Take 400 Units by mouth daily.   [DISCONTINUED] amLODipine  (NORVASC ) 2.5 MG tablet TAKE 1 TABLET BY MOUTH EVERY DAY   [DISCONTINUED] losartan -hydrochlorothiazide   (HYZAAR) 100-25 MG tablet TAKE 1 TABLET BY MOUTH EVERY DAY   [DISCONTINUED] pantoprazole  (PROTONIX ) 40 MG tablet TAKE 1 TABLET BY MOUTH EVERY DAY   No facility-administered medications prior to visit.

## 2024-01-24 ENCOUNTER — Encounter: Payer: Self-pay | Admitting: Obstetrics and Gynecology

## 2024-01-24 ENCOUNTER — Telehealth: Payer: Self-pay

## 2024-01-24 ENCOUNTER — Other Ambulatory Visit (HOSPITAL_COMMUNITY): Payer: Self-pay

## 2024-01-24 ENCOUNTER — Ambulatory Visit: Admitting: Obstetrics and Gynecology

## 2024-01-24 VITALS — BP 128/78 | HR 70

## 2024-01-24 DIAGNOSIS — M8000XD Age-related osteoporosis with current pathological fracture, unspecified site, subsequent encounter for fracture with routine healing: Secondary | ICD-10-CM | POA: Diagnosis not present

## 2024-01-24 NOTE — Telephone Encounter (Signed)
 SABRA

## 2024-01-24 NOTE — Telephone Encounter (Deleted)
-----   Message from Annabella LITTIE Stagger sent at 01/24/2024  1:00 PM EST -----  ----- Message ----- From: Glennon Almarie POUR, MD Sent: 01/24/2024  12:21 PM EST To: Damien ONEIDA Mau, RN; Annabella LITTIE Stagger, CPhT  Can we get coverage for evenity or prolia  Osteopenia with recent vertebral fracture on steroids   She is will to start something based on insurance. Cannot do fosamax with her reflux  Dr. Glennon Leyden insurance now. Jan going to [facility]

## 2024-01-24 NOTE — Progress Notes (Signed)
° °  Acute Office Visit  Subjective:    Patient ID: Robin Francis, female    DOB: December 19, 1945, 78 y.o.   MRN: [id number]   HPI 78 y.o. presents today for Consult (BMD results/ec) . Dexa- 2020 -1.7, 2023 -1.5 frax 10, 1.8%. scheduled 12/20/23 vertebral fracture on steroid therapy -1.9 RFN -1.8 LFN  No LMP recorded. Patient is postmenopausal.    Review of Systems     Objective:    OBGyn Exam  BP 128/78   Pulse 70  Wt Readings from Last 3 Encounters:  01/22/24 168 lb 14.4 oz (76.6 kg)  12/10/23 169 lb 3.2 oz (76.7 kg)  01/19/23 166 lb 12.8 oz (75.7 kg)        Assessment & Plan:  Discussed with patient 5% bone loss per year in menopause and continued risk of loss and fracture.  Considering her Osteopenia with fracture elevates her diagnosis to osteoporosis and need to treat and prevent more fractures. Options with r/b/a/I of each ie prolia, evenity, bisphosphonates were reviewed in detail. Patient with reflux and is not a candidate for the bisphosphonates. Discussed need for calcium and vit D and exercise as well. To see if there is insurance coverage for evenity  followed by prolia. Discussed may be a copay with [employer] of 20% and prolia may be a better option if so. Discussed no time limit on prolia and need to notify provider if she decides to change, since it will come off the receptor faster and risk for rebound fractures. She denies any stroke or heart attack in the past year. To send to [facility] pharmacy to check for coverage. 40 minutes spent on reviewing records, imaging,  and one on one patient time and counseling patient and documentation Dr. Glennon Almarie Robin Francis

## 2024-01-24 NOTE — Telephone Encounter (Addendum)
 Prolia VOB initiated via [email or web address]  Next Prolia inj DUE: NEW START  PHARMACY COPAY: $645.79   ----- Message from Annabella LITTIE Stagger sent at 01/24/2024  1:00 PM EST -----   ----- Message ----- From: Glennon Almarie POUR, MD Sent: 01/24/2024  12:21 PM EST To: Damien ONEIDA Mau, RN; Annabella LITTIE Stagger, CPhT   Can we get coverage for evenity or prolia   Osteopenia with recent vertebral fracture on steroids     She is will to start something based on insurance. Cannot do fosamax with her reflux   Dr. Glennon Leyden insurance now. Jan going to [facility]

## 2024-01-24 NOTE — Telephone Encounter (Signed)
 Buy/Bill (Office supplied medication)  Out-of-pocket cost due at time of clinic visit: $357  Number of injection/visits approved: 2  Primary: AET[employer]-MEDICARE Co-insurance: 20% Admin fee co-insurance: 20%  Secondary: --- Co-insurance:  Admin fee co-insurance:   Medical Benefit Details: Date Benefits were checked: 01/24/24 Deductible: NO/ Coinsurance: 20%/ Admin Fee: 20%  Prior Auth: APPROVED PA# [id number] Expiration Date: 01/24/24-01/23/25  # of doses approved: 2 -----------------------------------------------------------------------  Patient NOT eligible for Copay Card. Copay Card can make patient's cost as little as $25. Link to apply: [email or web address]://[email or web address].amgensupportplus.[email or web address]  ** This summary of benefits is an estimation of the patient's out-of-pocket cost. Exact cost may very based on individual plan coverage.

## 2024-01-24 NOTE — Telephone Encounter (Addendum)
 MEDICAL PA SUBMITTED VIA [employer]. Authorization Number : [id number]   APPROVED

## 2024-01-28 ENCOUNTER — Ambulatory Visit: Payer: Self-pay | Admitting: Family Medicine

## 2024-01-28 NOTE — Progress Notes (Addendum)
 [place] [facility]                                          MRN: [id number]   01/28/2024   The [facility] Quality Team Specialist reviewed this patient medical record for the purposes of chart review for care gap closure. The following were reviewed: abstraction for care gap closure-controlling blood pressure.  02/20/2024- no cbp to close 2025    [facility] Quality Team

## 2024-02-14 ENCOUNTER — Encounter: Payer: Self-pay | Admitting: *Deleted

## 2024-02-26 NOTE — Telephone Encounter (Signed)
 Patient declined. See referral.   Encounter closed.
